# Patient Record
Sex: Female | Born: 1945
Health system: Southern US, Community
[De-identification: ages and names within clinical notes are randomized; demographics above are authoritative.]

## PROBLEM LIST (undated history)

## (undated) DIAGNOSIS — D126 Benign neoplasm of colon, unspecified: Secondary | ICD-10-CM

## (undated) DIAGNOSIS — M1990 Unspecified osteoarthritis, unspecified site: Secondary | ICD-10-CM

## (undated) DIAGNOSIS — I10 Essential (primary) hypertension: Secondary | ICD-10-CM

## (undated) DIAGNOSIS — I700 Atherosclerosis of aorta: Secondary | ICD-10-CM

## (undated) DIAGNOSIS — C189 Malignant neoplasm of colon, unspecified: Secondary | ICD-10-CM

## (undated) DIAGNOSIS — C801 Malignant (primary) neoplasm, unspecified: Secondary | ICD-10-CM

## (undated) DIAGNOSIS — J45909 Unspecified asthma, uncomplicated: Secondary | ICD-10-CM

## (undated) DIAGNOSIS — K439 Ventral hernia without obstruction or gangrene: Secondary | ICD-10-CM

## (undated) DIAGNOSIS — T7840XA Allergy, unspecified, initial encounter: Secondary | ICD-10-CM

## (undated) DIAGNOSIS — E785 Hyperlipidemia, unspecified: Secondary | ICD-10-CM

## (undated) HISTORY — DX: Hyperlipidemia, unspecified: E78.5

## (undated) HISTORY — PX: JOINT REPLACEMENT: SHX530

## (undated) HISTORY — DX: Ventral hernia without obstruction or gangrene: K43.9

## (undated) HISTORY — PX: LAPAROSCOPIC PARTIAL COLECTOMY: SHX5907

## (undated) HISTORY — PX: HERNIA REPAIR: SHX51

## (undated) HISTORY — PX: KNEE SURGERY: SHX244

## (undated) HISTORY — DX: Unspecified asthma, uncomplicated: J45.909

## (undated) HISTORY — PX: SKIN SURGERY: SHX2413

## (undated) HISTORY — DX: Allergy, unspecified, initial encounter: T78.40XA

## (undated) HISTORY — PX: CARPAL TUNNEL RELEASE: SHX101

## (undated) HISTORY — DX: Essential (primary) hypertension: I10

## (undated) HISTORY — PX: GALLBLADDER SURGERY: SHX652

## (undated) HISTORY — PX: TRIGGER FINGER RELEASE: SHX641

---

## 1982-02-03 HISTORY — PX: VAGINAL HYSTERECTOMY: SUR661

## 1999-02-04 DIAGNOSIS — C801 Malignant (primary) neoplasm, unspecified: Secondary | ICD-10-CM

## 1999-02-04 HISTORY — DX: Malignant (primary) neoplasm, unspecified: C80.1

## 2005-05-14 ENCOUNTER — Ambulatory Visit: Payer: Self-pay | Admitting: Family Medicine

## 2005-09-15 ENCOUNTER — Ambulatory Visit: Payer: Self-pay | Admitting: Family Medicine

## 2005-09-18 ENCOUNTER — Ambulatory Visit: Payer: Self-pay | Admitting: Family Medicine

## 2006-04-27 ENCOUNTER — Ambulatory Visit: Payer: Self-pay | Admitting: General Practice

## 2006-05-20 ENCOUNTER — Ambulatory Visit: Payer: Self-pay | Admitting: General Practice

## 2006-05-20 ENCOUNTER — Other Ambulatory Visit: Payer: Self-pay

## 2006-06-03 ENCOUNTER — Ambulatory Visit: Payer: Self-pay | Admitting: General Practice

## 2006-11-11 ENCOUNTER — Ambulatory Visit: Payer: Self-pay | Admitting: Obstetrics and Gynecology

## 2007-01-05 ENCOUNTER — Ambulatory Visit: Payer: Self-pay | Admitting: Family Medicine

## 2007-02-04 DIAGNOSIS — C189 Malignant neoplasm of colon, unspecified: Secondary | ICD-10-CM

## 2007-02-04 HISTORY — PX: COLON SURGERY: SHX602

## 2007-02-04 HISTORY — DX: Malignant neoplasm of colon, unspecified: C18.9

## 2007-09-20 ENCOUNTER — Ambulatory Visit: Payer: Self-pay | Admitting: General Practice

## 2007-09-28 ENCOUNTER — Ambulatory Visit: Payer: Self-pay | Admitting: Gastroenterology

## 2007-10-08 ENCOUNTER — Ambulatory Visit: Payer: Self-pay | Admitting: Surgery

## 2007-10-08 ENCOUNTER — Other Ambulatory Visit: Payer: Self-pay

## 2007-10-13 ENCOUNTER — Inpatient Hospital Stay: Payer: Self-pay | Admitting: Surgery

## 2007-10-13 HISTORY — PX: COLECTOMY: SHX59

## 2007-12-02 ENCOUNTER — Ambulatory Visit: Payer: Self-pay | Admitting: Obstetrics and Gynecology

## 2008-12-26 ENCOUNTER — Ambulatory Visit: Payer: Self-pay | Admitting: Family Medicine

## 2009-04-03 ENCOUNTER — Ambulatory Visit: Payer: Self-pay | Admitting: General Practice

## 2009-04-23 ENCOUNTER — Inpatient Hospital Stay: Payer: Self-pay | Admitting: General Practice

## 2009-11-08 ENCOUNTER — Ambulatory Visit: Payer: Self-pay | Admitting: Family Medicine

## 2009-12-28 ENCOUNTER — Ambulatory Visit: Payer: Self-pay | Admitting: Family Medicine

## 2010-01-08 ENCOUNTER — Ambulatory Visit: Payer: Self-pay | Admitting: General Practice

## 2010-01-21 ENCOUNTER — Inpatient Hospital Stay: Payer: Self-pay | Admitting: General Practice

## 2010-09-24 ENCOUNTER — Ambulatory Visit: Payer: Self-pay | Admitting: Family Medicine

## 2010-09-26 ENCOUNTER — Ambulatory Visit: Payer: Self-pay | Admitting: Family Medicine

## 2010-11-05 ENCOUNTER — Ambulatory Visit: Payer: Self-pay | Admitting: Surgery

## 2010-11-05 HISTORY — PX: CHOLECYSTECTOMY: SHX55

## 2011-01-02 ENCOUNTER — Ambulatory Visit: Payer: Self-pay | Admitting: Family Medicine

## 2011-02-18 ENCOUNTER — Ambulatory Visit: Payer: Self-pay | Admitting: Unknown Physician Specialty

## 2011-08-12 ENCOUNTER — Ambulatory Visit: Payer: Self-pay | Admitting: Surgery

## 2011-08-12 LAB — CBC WITH DIFFERENTIAL/PLATELET
Basophil #: 0.1 10*3/uL (ref 0.0–0.1)
Eosinophil #: 0.3 10*3/uL (ref 0.0–0.7)
Eosinophil %: 3.3 %
MCH: 28.7 pg (ref 26.0–34.0)
MCHC: 33.6 g/dL (ref 32.0–36.0)
Monocyte #: 0.8 x10 3/mm (ref 0.2–0.9)
Monocyte %: 9.3 %
Neutrophil #: 5.5 10*3/uL (ref 1.4–6.5)
Neutrophil %: 63 %
Platelet: 222 10*3/uL (ref 150–440)
RBC: 4.56 10*6/uL (ref 3.80–5.20)
RDW: 14.6 % — ABNORMAL HIGH (ref 11.5–14.5)
WBC: 8.8 10*3/uL (ref 3.6–11.0)

## 2011-08-12 LAB — BASIC METABOLIC PANEL
BUN: 19 mg/dL — ABNORMAL HIGH (ref 7–18)
Chloride: 105 mmol/L (ref 98–107)
Co2: 31 mmol/L (ref 21–32)
EGFR (Non-African Amer.): >60
Glucose: 81 mg/dL (ref 65–99)
Osmolality: 284 (ref 275–301)
Potassium: 3.2 mmol/L — ABNORMAL LOW (ref 3.5–5.1)

## 2011-09-03 ENCOUNTER — Ambulatory Visit: Payer: Self-pay | Admitting: Surgery

## 2011-09-03 LAB — BASIC METABOLIC PANEL
Anion Gap: 7 (ref 7–16)
BUN: 14 mg/dL (ref 7–18)
Chloride: 104 mmol/L (ref 98–107)
Co2: 30 mmol/L (ref 21–32)
Creatinine: 0.88 mg/dL (ref 0.60–1.30)
EGFR (African American): >60
Glucose: 102 mg/dL — ABNORMAL HIGH (ref 65–99)
Sodium: 141 mmol/L (ref 136–145)

## 2011-09-08 ENCOUNTER — Ambulatory Visit: Payer: Self-pay | Admitting: Surgery

## 2012-01-07 ENCOUNTER — Ambulatory Visit: Payer: Self-pay | Admitting: Family Medicine

## 2012-06-29 ENCOUNTER — Ambulatory Visit: Payer: Self-pay | Admitting: Family Medicine

## 2013-07-04 ENCOUNTER — Ambulatory Visit: Payer: Self-pay | Admitting: Family Medicine

## 2013-07-05 ENCOUNTER — Ambulatory Visit: Payer: Self-pay | Admitting: Family Medicine

## 2013-08-03 ENCOUNTER — Ambulatory Visit: Payer: Self-pay | Admitting: Urology

## 2013-09-06 DIAGNOSIS — N2889 Other specified disorders of kidney and ureter: Secondary | ICD-10-CM | POA: Insufficient documentation

## 2013-09-06 DIAGNOSIS — N3941 Urge incontinence: Secondary | ICD-10-CM | POA: Insufficient documentation

## 2013-09-06 DIAGNOSIS — R310 Gross hematuria: Secondary | ICD-10-CM | POA: Insufficient documentation

## 2013-09-21 DIAGNOSIS — N8111 Cystocele, midline: Secondary | ICD-10-CM | POA: Insufficient documentation

## 2014-03-06 HISTORY — PX: COLONOSCOPY: SHX174

## 2014-03-20 ENCOUNTER — Ambulatory Visit: Payer: Self-pay | Admitting: Unknown Physician Specialty

## 2014-05-23 NOTE — Discharge Summary (Signed)
PATIENT NAME:  Kiara Becker, Kiara Becker MR#:  [id number] DATE OF BIRTH:  1945/10/01  DATE OF ADMISSION:  09/08/2011 DATE OF DISCHARGE:  09/09/2011  ATTENDING PHYSICIAN: Dr. Dia Crawford DICTATING PHYSICIAN: Dr. Marlyce Huge   DISCHARGE DIAGNOSIS: Ventral hernia.   PAST MEDICAL HISTORY:  1. Laparoscopic sigmoid colectomy in 2012.  2. History of hemorrhoids. 3. Hypertension. 4. Arrhythmias. 5. Arthritis. 6. Asthma.  7. Status post hysterectomy. 8. History of melanoma.  PROCEDURE PERFORMED: Open primary ventral hernia repair without mesh on 09/08/2011.  DISCHARGE MEDICATIONS: 1. Ultram 50 mg p.o. q.6 hours p.r.n. pain. 2. Fluticasone 50 mcg inhalation powder 1 spray each nostril p.r.n.  3. Ventolin HFA 2 puffs p.o. q.4-6 hours. 4. Torsemide 10 mg p.o. daily. 5. Crestor 5 mg p.o. daily. 6. Fish oil 1200 mg p.o. b.i.d. 7. Niacin 100 mg p.o. at bedtime. 8. Ocuvite PreserVision 1 tablet orally q.a.m.  9. Loratadine 10 mg p.o. daily. 10. Triamterene/hydrochlorothiazide 37.5/25, 1 tablet p.o. q.a.m.  11. Potassium 1 tablet p.o. daily.   NOTE: I have been told she is to hold her aspirin until further instructions after follow up.  HOSPITAL COURSE: [name] is a pleasant 69 year old female with history of laparoscopic sigmoid colectomy. She had an incisional hernia which required open repair. During the surgery she was found to have mobile, strong fascia so the fascia  was closed primarily to decrease the need for mesh and its inherent complications. Postoperatively she was returned to the floor and began on p.o. pain medications and was given IV fluids. She initially was nauseated but was slowly advanced to clear liquids and then regular diet. Her pain medicine was then advanced to p.o. pain medications. Her drains were still draining serosangous fluid and she was taught to empty those and record the output at home. At that time she desired to go home and met the criteria for discharge. She  was discharged home in satisfactory condition.   DISCHARGE INSTRUCTIONS: She is to follow up with Dr. Pat Patrick or a member from his team in 3 to 5 days. She is to record her drain outputs in the meantime. She is not to lift greater than 10 pounds. She is to wear a binder for comfort. She is told to take Tramadol for pain. She is told to hold on her aspirin until instructed. She is to call or return to ER if she develops fever greater than 101.5, nausea, vomiting, increased swelling, increased pain, drainage from her incision, redness from her incision, change in drain quantity or characteristic.   ____________________________ Glena Norfolk. Minie Roadcap, MD cal:cms D: 09/09/2011 14:47:33 ET T: 09/10/2011 11:34:36 ET JOB#: [id number]  cc: Harrell Gave A. Henli Hey, MD, <Dictator> Floyde Parkins MD ELECTRONICALLY SIGNED 09/10/2011 20:09

## 2014-05-23 NOTE — Op Note (Signed)
PATIENT NAME:  Kiara Becker, Kiara Becker MR#:  [id number] DATE OF BIRTH:  16-Feb-1945  DATE OF PROCEDURE:  09/08/2011  PREOPERATIVE DIAGNOSIS: Ventral hernia.   POSTOPERATIVE DIAGNOSIS: Ventral hernia.   OPERATION: Ventral hernia repair.   ANESTHESIA: General.   SURGEON: Rodena Goldmann, III, MD   ASSISTANT: Dr. Rexene Edison and Adelene Idler, PA student   ANESTHESIA: General.   OPERATIVE PROCEDURE: With the patient in the supine position after induction of appropriate general anesthesia, the patient's abdomen was prepped with ChloraPrep and draped with sterile towels. The patient's previous incision was opened and carried down through the subcutaneous tissue with Bovie electrocautery. The hernia sac was identified and appeared to be primarily at the umbilicus and below. It was dissected back to its edges in the fascia without difficulty. The defect was one large defect and multiple small defects which were all encompassed into a single midline defect. The sac was dissected back and omentum and adhesions removed from the abdominal wall. The abdomen was cleaned in a circumferential fashion to eliminate any obstruction or tension. The dissection was carried out to the lateral edge of the rectus muscle which was divided on the anterior fascia over the length of the skin incision to promote mobilization. There was plenty of laxity and I thought that the tissue could be closed primarily rather than placing a piece of mesh. For that reason individual figure-of-eight sutures of 0 Prolene were used to close the incision. Once the defect was completely obliterated, the area was irrigated. 10 mm Jackson-Pratt drain was inserted through separate stab wounds. Skin was clipped and the drain secured with 3-0 silk. A compressive dressing was applied.      The patient was returned to the recovery room having tolerated the procedure well. Sponge, instrument, and needle count were correct x2 in the operating room.    ____________________________ Rodena Goldmann III, MD rle:drc D: 09/08/2011 11:24:49 ET T: 09/08/2011 11:58:15 ET JOB#: [id number]  cc: Micheline Maze, MD, <Dictator> Juline Patch, MD Rodena Goldmann MD ELECTRONICALLY SIGNED 09/11/2011 22:33

## 2014-06-16 DIAGNOSIS — I10 Essential (primary) hypertension: Secondary | ICD-10-CM | POA: Insufficient documentation

## 2014-06-16 DIAGNOSIS — E782 Mixed hyperlipidemia: Secondary | ICD-10-CM | POA: Insufficient documentation

## 2014-06-22 ENCOUNTER — Other Ambulatory Visit: Payer: Self-pay | Admitting: Family Medicine

## 2014-06-22 DIAGNOSIS — Z1231 Encounter for screening mammogram for malignant neoplasm of breast: Secondary | ICD-10-CM

## 2014-07-07 ENCOUNTER — Ambulatory Visit: Payer: Self-pay

## 2014-07-11 ENCOUNTER — Ambulatory Visit: Payer: Self-pay

## 2014-07-14 ENCOUNTER — Ambulatory Visit
Admission: RE | Admit: 2014-07-14 | Discharge: 2014-07-14 | Disposition: A | Discharge status: Home / Self Care | Payer: Medicare Other | Source: Ambulatory Visit | Attending: Family Medicine | Admitting: Family Medicine

## 2014-07-14 ENCOUNTER — Other Ambulatory Visit: Payer: Self-pay

## 2014-07-14 ENCOUNTER — Other Ambulatory Visit: Payer: Self-pay | Admitting: Family Medicine

## 2014-07-14 DIAGNOSIS — R002 Palpitations: Secondary | ICD-10-CM | POA: Insufficient documentation

## 2014-07-14 DIAGNOSIS — Z1231 Encounter for screening mammogram for malignant neoplasm of breast: Secondary | ICD-10-CM | POA: Diagnosis not present

## 2014-07-14 DIAGNOSIS — I493 Ventricular premature depolarization: Secondary | ICD-10-CM | POA: Insufficient documentation

## 2014-07-14 HISTORY — DX: Malignant (primary) neoplasm, unspecified: C80.1

## 2014-07-14 HISTORY — DX: Malignant neoplasm of colon, unspecified: C18.9

## 2014-07-24 ENCOUNTER — Ambulatory Visit (INDEPENDENT_AMBULATORY_CARE_PROVIDER_SITE_OTHER): Payer: Medicare Other | Admitting: Family Medicine

## 2014-07-24 ENCOUNTER — Encounter: Payer: Self-pay | Admitting: Family Medicine

## 2014-07-24 VITALS — BP 130/88 | HR 66 | Ht 64.0 in | Wt 191.0 lb

## 2014-07-24 DIAGNOSIS — E669 Obesity, unspecified: Secondary | ICD-10-CM | POA: Insufficient documentation

## 2014-07-24 DIAGNOSIS — J4520 Mild intermittent asthma, uncomplicated: Secondary | ICD-10-CM | POA: Diagnosis not present

## 2014-07-24 DIAGNOSIS — I8393 Asymptomatic varicose veins of bilateral lower extremities: Secondary | ICD-10-CM | POA: Diagnosis not present

## 2014-07-24 DIAGNOSIS — Z0000 Encounter for general adult medical examination without abnormal findings: Secondary | ICD-10-CM

## 2014-07-24 DIAGNOSIS — I8390 Asymptomatic varicose veins of unspecified lower extremity: Secondary | ICD-10-CM

## 2014-07-24 MED ORDER — ALBUTEROL SULFATE HFA 108 (90 BASE) MCG/ACT IN AERS: 1.0000 | INHALATION_SPRAY | Freq: Every day | RESPIRATORY_TRACT | Status: DC

## 2014-07-24 NOTE — Progress Notes (Signed)
Name: Kiara Becker   MRN: [id number]    DOB: 05/02/1945   Date:07/24/2014       Progress Note  Subjective  Chief Complaint  Chief Complaint  Patient presents with  . Annual Exam    had mammo this year    Gynecologic Exam The patient's pertinent negatives include no pelvic pain, vaginal bleeding or vaginal discharge. The problem occurs constantly. The pain is mild. The problem affects both sides. Associated symptoms include back pain. Pertinent negatives include no abdominal pain, chills, constipation, diarrhea, dysuria, fever, frequency, headaches, hematuria, joint pain, nausea, rash, sore throat or urgency. Her past medical history is significant for an abdominal surgery and a gynecological surgery. There is no history of a Cesarean section, endometriosis, menorrhagia, metrorrhagia, perineal abscess, PID, an STD or a terminated pregnancy.    No problem-specific assessment & plan notes found for this encounter.   Past Medical History  Diagnosis Date  . Cancer 2001    melanoma  . Colon cancer 2009  . Allergy   . Asthma   . Hyperlipidemia   . Hypertension     Past Surgical History  Procedure Laterality Date  . Vaginal hysterectomy  1984  . Colon surgery  2009    remove a mass  . Knee surgery Bilateral     menisacal repair and then replacement  . Hernia repair    . Skin surgery      removal melanoma on arm and eye    Family History  Problem Relation Age of Onset  . Cancer Mother   . Heart disease Mother   . Heart disease Father   . Heart disease Brother   . Drug abuse Maternal Grandfather     History   Social History  . Marital Status: Married    Spouse Name: N/A  . Number of Children: N/A  . Years of Education: N/A   Occupational History  . Not on file.   Social History Main Topics  . Smoking status: Never Smoker   . Smokeless tobacco: Not on file  . Alcohol Use: No  . Drug Use: No  . Sexual Activity: Yes    Birth Control/ Protection: None   Other  Topics Concern  . Not on file   Social History Narrative    Allergies  Allergen Reactions  . Choline Fenofibrate Other (See Comments)    Roaring in ears Roaring in ears  . Codeine Sulfate Other (See Comments)  . Ezetimibe Other (See Comments)    Roaring in ears Roaring in ears  . Fenofibrate Other (See Comments)    Roaring in ears  . Gemfibrozil Other (See Comments)     Review of Systems  Constitutional: Negative for fever, chills, weight loss and malaise/fatigue.  HENT: Negative for ear discharge, ear pain and sore throat.   Eyes: Negative for blurred vision.  Respiratory: Negative for cough, sputum production, shortness of breath and wheezing.   Cardiovascular: Negative for chest pain, palpitations and leg swelling.       Varicose veins  Gastrointestinal: Negative for heartburn, nausea, abdominal pain, diarrhea, constipation, blood in stool and melena.  Genitourinary: Negative for dysuria, urgency, frequency, hematuria, vaginal discharge, pelvic pain and menorrhagia.  Musculoskeletal: Positive for back pain. Negative for myalgias, joint pain and neck pain.  Skin: Negative for rash.  Neurological: Negative for dizziness, tingling, sensory change, focal weakness and headaches.  Endo/Heme/Allergies: Negative for environmental allergies and polydipsia. Does not bruise/bleed easily.  Psychiatric/Behavioral: Negative for depression and suicidal ideas.  The patient is not nervous/anxious and does not have insomnia.      Objective  Filed Vitals:   07/24/14 0922  BP: 130/88  Pulse: 66  Height: 5\' 4"  (1.626 m)  Weight: 191 lb (86.637 kg)    Physical Exam  Constitutional: She is oriented to person, place, and time and well-developed, well-nourished, and in no distress. No distress.  HENT:  Head: Normocephalic and atraumatic.  Right Ear: External ear normal.  Left Ear: External ear normal.  Nose: Nose normal.  Mouth/Throat: Oropharynx is clear and moist.  Eyes:  Conjunctivae and EOM are normal. Pupils are equal, round, and reactive to light. Right eye exhibits no discharge. Left eye exhibits no discharge. No scleral icterus.  Neck: Normal range of motion. Neck supple. No JVD present. No thyromegaly present.  Cardiovascular: Normal rate, regular rhythm, normal heart sounds and intact distal pulses.  Exam reveals no gallop and no friction rub.   No murmur heard. Pulmonary/Chest: Effort normal and breath sounds normal. No respiratory distress. She has no wheezes. She exhibits no tenderness.  Abdominal: Soft. Bowel sounds are normal. She exhibits no mass. There is no tenderness. There is no rebound and no guarding.  Musculoskeletal: Normal range of motion. She exhibits no edema.  Lymphadenopathy:    She has no cervical adenopathy.  Neurological: She is alert and oriented to person, place, and time. She has normal reflexes. No cranial nerve deficit. Gait normal.  Skin: Skin is warm and dry. No rash noted. She is not diaphoretic. No erythema.  Psychiatric: Mood and affect normal.  Nursing note and vitals reviewed.     Assessment & Plan  Problem List Items Addressed This Visit    None        Dr. Otilio Miu [facility]  07/24/2014

## 2014-07-24 NOTE — Patient Instructions (Signed)

## 2014-08-08 ENCOUNTER — Encounter: Payer: Self-pay | Admitting: Surgery

## 2014-08-14 ENCOUNTER — Ambulatory Visit (INDEPENDENT_AMBULATORY_CARE_PROVIDER_SITE_OTHER): Payer: Medicare Other | Admitting: Family Medicine

## 2014-08-14 ENCOUNTER — Encounter: Payer: Self-pay | Admitting: Family Medicine

## 2014-08-14 VITALS — BP 110/80 | HR 64 | Ht 64.0 in | Wt 190.0 lb

## 2014-08-14 DIAGNOSIS — J4520 Mild intermittent asthma, uncomplicated: Secondary | ICD-10-CM | POA: Diagnosis not present

## 2014-08-14 DIAGNOSIS — J219 Acute bronchiolitis, unspecified: Secondary | ICD-10-CM

## 2014-08-14 MED ORDER — AZITHROMYCIN 250 MG PO TABS: ORAL_TABLET | ORAL | Status: DC

## 2014-08-14 MED ORDER — ALBUTEROL SULFATE (2.5 MG/3ML) 0.083% IN NEBU: 2.5000 mg | INHALATION_SOLUTION | Freq: Once | RESPIRATORY_TRACT | Status: AC

## 2014-08-14 NOTE — Progress Notes (Signed)
Name: Kiara Becker   MRN: [id number]    DOB: [birth year]   Date:08/14/2014       Progress Note  Subjective  Chief Complaint  Chief Complaint  Patient presents with  . Bronchitis    cough- clear/thick production    Cough This is a new problem. The current episode started in the past 7 days. The problem has been unchanged. The problem occurs hourly. The cough is non-productive. Associated symptoms include chills, a fever and wheezing. Pertinent negatives include no chest pain, ear congestion, ear pain, headaches, heartburn, hemoptysis, myalgias, nasal congestion, postnasal drip, rash, rhinorrhea, sore throat, shortness of breath, sweats or weight loss. Nothing aggravates the symptoms. She has tried a beta-agonist inhaler for the symptoms. The treatment provided mild relief. Her past medical history is significant for asthma. There is no history of bronchiectasis, bronchitis, COPD, emphysema, environmental allergies or pneumonia.    No problem-specific assessment & plan notes found for this encounter.   Past Medical History  Diagnosis Date  . Cancer 2001    melanoma  . Colon cancer 2009  . Allergy   . Asthma   . Hyperlipidemia   . Hypertension     Past Surgical History  Procedure Laterality Date  . Vaginal hysterectomy  1984  . Colon surgery  2009    remove a mass  . Knee surgery Bilateral     menisacal repair and then replacement  . Hernia repair    . Skin surgery      removal melanoma on arm and eye  . Gallbladder surgery    . Colonoscopy  03/2014    normal- cleared for 5 years- Dr Vira Agar    Family History  Problem Relation Age of Onset  . Cancer Mother   . Heart disease Mother   . Heart disease Father   . Heart disease Brother   . Drug abuse Maternal Grandfather     History   Social History  . Marital Status: Married    Spouse Name: N/A  . Number of Children: N/A  . Years of Education: N/A   Occupational History  . Not on file.   Social History Main  Topics  . Smoking status: Never Smoker   . Smokeless tobacco: Not on file  . Alcohol Use: No  . Drug Use: No  . Sexual Activity: Yes    Birth Control/ Protection: None   Other Topics Concern  . Not on file   Social History Narrative    Allergies  Allergen Reactions  . Choline Fenofibrate Other (See Comments)    Roaring in ears Roaring in ears  . Codeine Sulfate Other (See Comments)  . Ezetimibe Other (See Comments)    Roaring in ears Roaring in ears  . Fenofibrate Other (See Comments)    Roaring in ears  . Gemfibrozil Other (See Comments)     Review of Systems  Constitutional: Positive for fever and chills. Negative for weight loss and malaise/fatigue.  HENT: Negative for ear discharge, ear pain, postnasal drip, rhinorrhea and sore throat.   Eyes: Negative for blurred vision.  Respiratory: Positive for cough and wheezing. Negative for hemoptysis, sputum production and shortness of breath.   Cardiovascular: Negative for chest pain, palpitations and leg swelling.  Gastrointestinal: Negative for heartburn, nausea, abdominal pain, diarrhea, constipation, blood in stool and melena.  Genitourinary: Negative for dysuria, urgency, frequency and hematuria.  Musculoskeletal: Negative for myalgias, back pain, joint pain and neck pain.  Skin: Negative for rash.  Neurological: Negative  for dizziness, tingling, sensory change, focal weakness and headaches.  Endo/Heme/Allergies: Negative for environmental allergies and polydipsia. Does not bruise/bleed easily.  Psychiatric/Behavioral: Negative for depression and suicidal ideas. The patient is not nervous/anxious and does not have insomnia.      Objective  Filed Vitals:   08/14/14 1432  BP: 110/80  Pulse: 64  Height: 5\' 4"  (1.626 m)  Weight: 190 lb (86.183 kg)    Physical Exam  Constitutional: She is well-developed, well-nourished, and in no distress. No distress.  HENT:  Head: Normocephalic and atraumatic.  Right Ear:  External ear normal.  Left Ear: External ear normal.  Nose: Nose normal.  Mouth/Throat: Oropharynx is clear and moist.  Eyes: Conjunctivae and EOM are normal. Pupils are equal, round, and reactive to light. Right eye exhibits no discharge. Left eye exhibits no discharge.  Neck: Normal range of motion. Neck supple. No JVD present. No thyromegaly present.  Cardiovascular: Normal rate, regular rhythm, normal heart sounds and intact distal pulses.  Exam reveals no gallop and no friction rub.   No murmur heard. Pulmonary/Chest: Effort normal and breath sounds normal.  Abdominal: Soft. Bowel sounds are normal. She exhibits no mass. There is no tenderness. There is no guarding.  Musculoskeletal: Normal range of motion. She exhibits no edema.  Lymphadenopathy:    She has no cervical adenopathy.  Neurological: She is alert. She has normal reflexes.  Skin: Skin is warm and dry. She is not diaphoretic.  Psychiatric: Mood and affect normal.      Assessment & Plan  Problem List Items Addressed This Visit      Respiratory   Bronchiolitis - Primary   Relevant Medications   azithromycin (ZITHROMAX) 250 MG tablet    Other Visit Diagnoses    Reactive airway disease, mild intermittent, uncomplicated        Relevant Medications    albuterol (PROVENTIL) (2.5 MG/3ML) 0.083% nebulizer solution 2.5 mg (Start on 08/14/2014  3:15 PM)         Dr. Macon [facility]  08/14/2014

## 2014-08-29 ENCOUNTER — Ambulatory Visit (INDEPENDENT_AMBULATORY_CARE_PROVIDER_SITE_OTHER): Payer: Medicare Other | Admitting: Surgery

## 2014-08-29 ENCOUNTER — Encounter: Payer: Self-pay | Admitting: Surgery

## 2014-08-29 VITALS — BP 155/72 | HR 74 | Temp 98.9°F | Ht 64.0 in | Wt 192.0 lb

## 2014-08-29 DIAGNOSIS — K439 Ventral hernia without obstruction or gangrene: Secondary | ICD-10-CM | POA: Diagnosis not present

## 2014-08-29 NOTE — Progress Notes (Signed)
Surgical Consultation  08/29/2014  Kiara Becker is an 69 y.o. female. With an abdominal wall mass and abdominal pain.  Chief Complaint  Patient presents with  . Hernia    possible sx     HPI: She underwent a ventral hernia repair 3 years ago for multiple abdominal wall defects. He comes of the laxity of her abdominal wall we did not place mesh. Over the last several months she's noticed an increase in her abdominal wall tenderness and a mass on the left side. She's been evaluated by her primary care physician but has not undergone any recent CT scan imaging.  She has no bowel or bladder habit changes associated with this mass. She does have some moderate exercise-induced discomfort. She's had a recent colonoscopy which was unremarkable. She is also being followed by the urologist for a right kidney lesion.  Past Medical History  Diagnosis Date  . Cancer 2001    melanoma  . Colon cancer 2009  . Allergy   . Asthma   . Hyperlipidemia   . Hypertension     Past Surgical History  Procedure Laterality Date  . Vaginal hysterectomy  1984  . Colon surgery  2009    remove a mass  . Knee surgery Bilateral     menisacal repair and then replacement  . Hernia repair    . Skin surgery      removal melanoma on arm and eye  . Gallbladder surgery    . Colonoscopy  03/2014    normal- cleared for 5 years- Dr Vira Agar  . Cholecystectomy  11/05/2010    Dr. Pat Patrick  . Colectomy  10/13/2007    Laparoscopic assisting ascending colectomy    Family History  Problem Relation Age of Onset  . Cancer Mother   . Heart disease Mother   . Heart disease Father   . Heart disease Brother   . Drug abuse Maternal Grandfather     Social History:  reports that she has never smoked. She has never used smokeless tobacco. She reports that she does not drink alcohol or use illicit drugs.  Allergies:  Allergies  Allergen Reactions  . Choline Fenofibrate Other (See Comments)    Roaring in ears  . Codeine  Other (See Comments)    Unknown  . Codeine Sulfate Other (See Comments)  . Ezetimibe Other (See Comments)    Roaring in ears  . Fenofibrate Other (See Comments)    Roaring in ears  . Gemfibrozil Other (See Comments)    Medications reviewed.     Review of Systems  Constitutional: Negative.   HENT: Negative.   Eyes: Negative.   Respiratory: Negative.   Cardiovascular: Negative.   Gastrointestinal: Positive for abdominal pain. Negative for nausea and vomiting.  Genitourinary: Negative.   Musculoskeletal: Negative.   Skin: Negative.   Neurological: Negative.   Endo/Heme/Allergies: Negative.   Psychiatric/Behavioral: Negative.        BP 155/72 mmHg  Pulse 74  Temp(Src) 98.9 F (37.2 C) (Oral)  Ht 5\' 4"  (1.626 m)  Wt 192 lb (87.091 kg)  BMI 32.94 kg/m2  Physical Exam  Constitutional: She is oriented to person, place, and time and well-developed, well-nourished, and in no distress. No distress.  HENT:  Head: Normocephalic and atraumatic.  Eyes: Conjunctivae are normal. Pupils are equal, round, and reactive to light.  Neck: Normal range of motion. Neck supple.  Cardiovascular: Regular rhythm and normal heart sounds.   Pulmonary/Chest: Effort normal and breath sounds normal.  Abdominal:  She exhibits mass. She exhibits no distension. There is tenderness.  She has a moderate left lower abdomen impulse with a clear ventral hernia ring and the abdominal wall.  Musculoskeletal: Normal range of motion. She exhibits no edema.  Neurological: She is alert and oriented to person, place, and time.  Skin: Skin is warm and dry.  Psychiatric: Mood and affect normal.      No results found for this or any previous visit (from the past 48 hour(s)). No results found.  Assessment/Plan: 1. Ventral hernia without obstruction or gangrene She appears to have recurrent abdominal wall hernia. There is no evidence for obstruction or incarceration. It does reduce fairly easily. She would  like to consider laparoscopic repair possible. We will arrange for a CT scan for further evaluation of the abdominal wall to give [place] an idea if that option is possible. We outlined the risks and benefits of surgical intervention versus observation. She would very much like to consider a repair if at all possible.   Dia Crawford III dermatitis

## 2014-08-29 NOTE — Patient Instructions (Signed)
You will need to have a CT scan of your Abdomen and Pelvis. Central scheduling will call you with appointment date and time.  Instructions for exam include: Nothing to eat four hours prior to exam, may have clear liquids. Bring medication list with you to your appointment and arrive 15 minutes prior to scheduled time to register.  Our office will call you with results and recommendations for follow-up.

## 2014-09-06 ENCOUNTER — Ambulatory Visit
Admission: RE | Admit: 2014-09-06 | Discharge: 2014-09-06 | Disposition: A | Discharge status: Home / Self Care | Payer: Medicare Other | Source: Ambulatory Visit | Attending: Surgery | Admitting: Surgery

## 2014-09-06 DIAGNOSIS — Z90710 Acquired absence of both cervix and uterus: Secondary | ICD-10-CM | POA: Insufficient documentation

## 2014-09-06 DIAGNOSIS — K439 Ventral hernia without obstruction or gangrene: Secondary | ICD-10-CM

## 2014-09-06 DIAGNOSIS — Z9049 Acquired absence of other specified parts of digestive tract: Secondary | ICD-10-CM | POA: Insufficient documentation

## 2014-09-06 DIAGNOSIS — K760 Fatty (change of) liver, not elsewhere classified: Secondary | ICD-10-CM | POA: Insufficient documentation

## 2014-09-06 DIAGNOSIS — K5730 Diverticulosis of large intestine without perforation or abscess without bleeding: Secondary | ICD-10-CM | POA: Diagnosis not present

## 2014-09-11 ENCOUNTER — Telehealth: Payer: Self-pay

## 2014-09-11 NOTE — Telephone Encounter (Signed)
Results of CT have been reviewed.   Patient called at this time to schedule follow-up appointment. Patient scheduled 8/29 at 2pm in [place]. Readback of all scheduling details completed over the phone.

## 2014-10-02 ENCOUNTER — Ambulatory Visit (INDEPENDENT_AMBULATORY_CARE_PROVIDER_SITE_OTHER): Payer: Medicare Other | Admitting: Surgery

## 2014-10-02 ENCOUNTER — Encounter: Payer: Self-pay | Admitting: *Deleted

## 2014-10-02 VITALS — BP 150/83 | HR 79 | Temp 98.4°F | Wt 184.0 lb

## 2014-10-02 DIAGNOSIS — K439 Ventral hernia without obstruction or gangrene: Secondary | ICD-10-CM

## 2014-10-02 NOTE — Progress Notes (Signed)
  Surgical Consultation  10/02/2014  Kiara Becker is an 69 y.o. female.   Chief Complaint  Patient presents with  . Follow-up    CT scan result resolve     HPI: She returns following her recent CT scan. She has small palpable abdominal wall hernia likely from her previous surgery. CT scan demonstrates very small defect fat involved. Does not appear to be any evidence for bowel in the defect. She still has a small 11 mm kidney lesion on the right side which is unchanged. At lesion is being followed by her urologist. She denies any new symptoms.  Past Medical History  Diagnosis Date  . Cancer 2001    melanoma  . Colon cancer 2009  . Allergy   . Asthma   . Hyperlipidemia   . Hypertension   . Ventral hernia     Past Surgical History  Procedure Laterality Date  . Vaginal hysterectomy  1984  . Colon surgery  2009    remove a mass  . Knee surgery Bilateral     menisacal repair and then replacement  . Hernia repair    . Skin surgery      removal melanoma on arm and eye  . Gallbladder surgery    . Colonoscopy  03/2014    normal- cleared for 5 years- Dr Vira Agar  . Cholecystectomy  11/05/2010    Dr. Pat Patrick  . Colectomy  10/13/2007    Laparoscopic assisting ascending colectomy    Family History  Problem Relation Age of Onset  . Cancer Mother   . Heart disease Mother   . Heart disease Father   . Heart disease Brother   . Drug abuse Maternal Grandfather     Social History:  reports that she has never smoked. She has never used smokeless tobacco. She reports that she does not drink alcohol or use illicit drugs.  Allergies:  Allergies  Allergen Reactions  . Choline Fenofibrate Other (See Comments)    Roaring in ears  . Codeine Other (See Comments)    Unknown  . Codeine Sulfate Other (See Comments)  . Ezetimibe Other (See Comments)    Roaring in ears  . Fenofibrate Other (See Comments)    Roaring in ears  . Gemfibrozil Other (See Comments)    Medications  reviewed.     ROS     BP 150/83 mmHg  Pulse 79  Temp(Src) 98.4 F (36.9 C) (Oral)  Wt 184 lb (83.462 kg)  Physical Exam no physical examination was performed today.    No results found for this or any previous visit (from the past 48 hour(s)). No results found.  Assessment/Plan: 1. Ventral hernia without obstruction or gangrene We discussed situation at length. Greater than 50% of our time was involved in discussion and counseling. We talked about indications for surgical intervention and the possible complications. At the present time she seems to be minimally impacted by the defect. We'll simply follow her along at this point. We did prescribe an abdominal binder to assist with her more strenuous activities.   Dia Crawford III dermatitis

## 2014-10-23 ENCOUNTER — Ambulatory Visit (INDEPENDENT_AMBULATORY_CARE_PROVIDER_SITE_OTHER): Payer: Medicare Other | Admitting: Family Medicine

## 2014-10-23 ENCOUNTER — Encounter: Payer: Self-pay | Admitting: Family Medicine

## 2014-10-23 VITALS — BP 120/80 | HR 98 | Ht 64.0 in | Wt 181.0 lb

## 2014-10-23 DIAGNOSIS — J219 Acute bronchiolitis, unspecified: Secondary | ICD-10-CM

## 2014-10-23 DIAGNOSIS — J4521 Mild intermittent asthma with (acute) exacerbation: Secondary | ICD-10-CM | POA: Diagnosis not present

## 2014-10-23 MED ORDER — AZITHROMYCIN 250 MG PO TABS: ORAL_TABLET | ORAL | Status: DC

## 2014-10-23 NOTE — Progress Notes (Signed)
Name: Kiara Becker   MRN: [id number]    DOB: Jun 23, 1945   Date:10/23/2014       Progress Note  Subjective  Chief Complaint  Chief Complaint  Patient presents with  . Cough    thick white production- asthma meds not helping    Cough This is a recurrent problem. The current episode started in the past 7 days. The problem has been gradually worsening. The problem occurs constantly. The cough is productive of sputum. Associated symptoms include chest pain, ear congestion, postnasal drip, rhinorrhea, a sore throat, shortness of breath and wheezing. Pertinent negatives include no chills, ear pain, fever, headaches, heartburn, myalgias, nasal congestion, rash or weight loss. The symptoms are aggravated by pollens. Risk factors for lung disease include travel. She has tried a beta-agonist inhaler for the symptoms. The treatment provided no relief. Her past medical history is significant for asthma and bronchitis. There is no history of bronchiectasis, COPD, emphysema, environmental allergies or pneumonia.    No problem-specific assessment & plan notes found for this encounter.   Past Medical History  Diagnosis Date  . Cancer 2001    melanoma  . Colon cancer 2009  . Allergy   . Asthma   . Hyperlipidemia   . Hypertension   . Ventral hernia     Past Surgical History  Procedure Laterality Date  . Vaginal hysterectomy  1984  . Colon surgery  2009    remove a mass  . Knee surgery Bilateral     menisacal repair and then replacement  . Hernia repair    . Skin surgery      removal melanoma on arm and eye  . Gallbladder surgery    . Colonoscopy  03/2014    normal- cleared for 5 years- Dr Vira Agar  . Cholecystectomy  11/05/2010    Dr. Pat Patrick  . Colectomy  10/13/2007    Laparoscopic assisting ascending colectomy    Family History  Problem Relation Age of Onset  . Cancer Mother   . Heart disease Mother   . Heart disease Father   . Heart disease Brother   . Drug abuse Maternal Grandfather      Social History   Social History  . Marital Status: Married    Spouse Name: N/A  . Number of Children: N/A  . Years of Education: N/A   Occupational History  . Not on file.   Social History Main Topics  . Smoking status: Never Smoker   . Smokeless tobacco: Never Used  . Alcohol Use: No  . Drug Use: No  . Sexual Activity: Yes    Birth Control/ Protection: None   Other Topics Concern  . Not on file   Social History Narrative    Allergies  Allergen Reactions  . Choline Fenofibrate Other (See Comments)    Roaring in ears  . Codeine Other (See Comments)    Unknown  . Codeine Sulfate Other (See Comments)  . Ezetimibe Other (See Comments)    Roaring in ears  . Fenofibrate Other (See Comments)    Roaring in ears  . Gemfibrozil Other (See Comments)     Review of Systems  Constitutional: Negative for fever, chills, weight loss and malaise/fatigue.  HENT: Positive for postnasal drip, rhinorrhea and sore throat. Negative for ear discharge and ear pain.   Eyes: Negative for blurred vision.  Respiratory: Positive for cough, shortness of breath and wheezing. Negative for sputum production.   Cardiovascular: Positive for chest pain. Negative for palpitations and  leg swelling.  Gastrointestinal: Negative for heartburn, nausea, abdominal pain, diarrhea, constipation, blood in stool and melena.  Genitourinary: Negative for dysuria, urgency, frequency and hematuria.  Musculoskeletal: Negative for myalgias, back pain, joint pain and neck pain.  Skin: Negative for rash.  Neurological: Negative for dizziness, tingling, sensory change, focal weakness and headaches.  Endo/Heme/Allergies: Negative for environmental allergies and polydipsia. Does not bruise/bleed easily.  Psychiatric/Behavioral: Negative for depression and suicidal ideas. The patient is not nervous/anxious and does not have insomnia.      Objective  Filed Vitals:   10/23/14 1457  BP: 120/80  Pulse: 98  Height:  5\' 4"  (6.659 m)  Weight: 181 lb (82.101 kg)  SpO2: 98%    Physical Exam  Constitutional: She is well-developed, well-nourished, and in no distress. No distress.  HENT:  Head: Normocephalic and atraumatic.  Right Ear: External ear normal.  Left Ear: External ear normal.  Nose: Nose normal.  Mouth/Throat: Oropharynx is clear and moist.  Eyes: Conjunctivae and EOM are normal. Pupils are equal, round, and reactive to light. Right eye exhibits no discharge. Left eye exhibits no discharge.  Neck: Normal range of motion. Neck supple. No JVD present. No thyromegaly present.  Cardiovascular: Normal rate, regular rhythm, normal heart sounds and intact distal pulses.  Exam reveals no gallop and no friction rub.   No murmur heard. Pulmonary/Chest: Effort normal and breath sounds normal.  Abdominal: Soft. Bowel sounds are normal. She exhibits no mass. There is no tenderness. There is no guarding.  Musculoskeletal: Normal range of motion. She exhibits no edema.  Lymphadenopathy:    She has no cervical adenopathy.  Neurological: She is alert. She has normal reflexes.  Skin: Skin is warm and dry. She is not diaphoretic.  Psychiatric: Mood and affect normal.      Assessment & Plan  Problem List Items Addressed This Visit      Respiratory   Bronchiolitis - Primary   Relevant Medications   azithromycin (ZITHROMAX) 250 MG tablet    Other Visit Diagnoses    cont ventolin             Dr. Deanna Jones [facility]  10/23/2014

## 2014-12-04 ENCOUNTER — Ambulatory Visit (INDEPENDENT_AMBULATORY_CARE_PROVIDER_SITE_OTHER): Payer: Medicare Other

## 2014-12-04 DIAGNOSIS — Z23 Encounter for immunization: Secondary | ICD-10-CM

## 2015-03-01 ENCOUNTER — Encounter: Payer: Self-pay | Admitting: Family Medicine

## 2015-03-01 ENCOUNTER — Ambulatory Visit (INDEPENDENT_AMBULATORY_CARE_PROVIDER_SITE_OTHER): Payer: 59 | Admitting: Family Medicine

## 2015-03-01 VITALS — BP 138/80 | HR 78 | Ht 64.0 in | Wt 183.0 lb

## 2015-03-01 DIAGNOSIS — K1120 Sialoadenitis, unspecified: Secondary | ICD-10-CM

## 2015-03-01 MED ORDER — AMOXICILLIN-POT CLAVULANATE 875-125 MG PO TABS: 1.0000 | ORAL_TABLET | Freq: Two times a day (BID) | ORAL | Status: DC

## 2015-03-01 NOTE — Progress Notes (Signed)
Name: Kiara Becker   MRN: [id number]    DOB: March 30, 1945   Date:03/01/2015       Progress Note  Subjective  Chief Complaint  Chief Complaint  Patient presents with  . Mass    on L) side of neck towards the fron of neck- difficulty swallowing while eating yesterday- went to brush teeth and looked in the mirror and could see a lump/ swollen area on L) side of neck    HPI  No problem-specific assessment & plan notes found for this encounter.   Past Medical History  Diagnosis Date  . Cancer ([place]) 2001    melanoma  . Colon cancer ([place]) 2009  . Allergy   . Asthma   . Hyperlipidemia   . Hypertension   . Ventral hernia     Past Surgical History  Procedure Laterality Date  . Vaginal hysterectomy  1984  . Colon surgery  2009    remove a mass  . Knee surgery Bilateral     menisacal repair and then replacement  . Hernia repair    . Skin surgery      removal melanoma on arm and eye  . Gallbladder surgery    . Colonoscopy  03/2014    normal- cleared for 5 years- Dr Vira Agar  . Cholecystectomy  11/05/2010    Dr. Pat Patrick  . Colectomy  10/13/2007    Laparoscopic assisting ascending colectomy    Family History  Problem Relation Age of Onset  . Cancer Mother   . Heart disease Mother   . Heart disease Father   . Heart disease Brother   . Drug abuse Maternal Grandfather     Social History   Social History  . Marital Status: Married    Spouse Name: N/A  . Number of Children: N/A  . Years of Education: N/A   Occupational History  . Not on file.   Social History Main Topics  . Smoking status: Never Smoker   . Smokeless tobacco: Never Used  . Alcohol Use: No  . Drug Use: No  . Sexual Activity: Yes    Birth Control/ Protection: None   Other Topics Concern  . Not on file   Social History Narrative    Allergies  Allergen Reactions  . Choline Fenofibrate Other (See Comments)    Roaring in ears  . Codeine Other (See Comments)    Unknown  . Codeine Sulfate Other (See  Comments)  . Ezetimibe Other (See Comments)    Roaring in ears  . Fenofibrate Other (See Comments)    Roaring in ears  . Gemfibrozil Other (See Comments)     Review of Systems  Constitutional: Negative for fever, chills, weight loss and malaise/fatigue.  HENT: Negative for ear discharge, ear pain and sore throat.   Eyes: Negative for blurred vision.  Respiratory: Negative for cough, sputum production, shortness of breath and wheezing.   Cardiovascular: Negative for chest pain, palpitations and leg swelling.  Gastrointestinal: Negative for heartburn, nausea, abdominal pain, diarrhea, constipation, blood in stool and melena.  Genitourinary: Negative for dysuria, urgency, frequency and hematuria.  Musculoskeletal: Negative for myalgias, back pain, joint pain and neck pain.  Skin: Negative for rash.  Neurological: Negative for dizziness, tingling, sensory change, focal weakness and headaches.  Endo/Heme/Allergies: Negative for environmental allergies and polydipsia. Does not bruise/bleed easily.  Psychiatric/Behavioral: Negative for depression and suicidal ideas. The patient is not nervous/anxious and does not have insomnia.      Objective  Filed Vitals:  03/01/15 1056  BP: 138/80  Pulse: 78  Height: 5\' 4"  (1.626 m)  Weight: 183 lb (83.008 kg)    Physical Exam  Constitutional: She is well-developed, well-nourished, and in no distress. No distress.  HENT:  Head: Normocephalic and atraumatic.  Right Ear: External ear normal.  Left Ear: External ear normal.  Nose: Nose normal.  Mouth/Throat: Oropharynx is clear and moist. No oral lesions. No posterior oropharyngeal edema.  Eyes: Conjunctivae and EOM are normal. Pupils are equal, round, and reactive to light. Right eye exhibits no discharge. Left eye exhibits no discharge.  Neck: Trachea normal and normal range of motion. Neck supple. No JVD present. No thyromegaly present.    Enlarged/ tender left parotid  Cardiovascular:  Normal rate, regular rhythm, normal heart sounds and intact distal pulses.  Exam reveals no gallop and no friction rub.   No murmur heard. Pulmonary/Chest: Effort normal and breath sounds normal.  Abdominal: Soft. Bowel sounds are normal. She exhibits no mass. There is no tenderness. There is no guarding.  Musculoskeletal: Normal range of motion. She exhibits no edema.  Lymphadenopathy:    She has no cervical adenopathy.  Neurological: She is alert. She has normal reflexes.  Skin: Skin is warm and dry. She is not diaphoretic.  Psychiatric: Mood and affect normal.  Nursing note and vitals reviewed.     Assessment & Plan  Problem List Items Addressed This Visit    None    Visit Diagnoses    Parotiditis    -  Primary    Relevant Medications    amoxicillin-clavulanate (AUGMENTIN) 875-125 MG tablet         Dr. Otilio Miu [facility]  03/01/2015

## 2015-03-01 NOTE — Patient Instructions (Signed)
Parotitis Parotitis is soreness and inflammation of one or both parotid glands. The parotid glands produce saliva. They are located on each side of the face, below and in front of the earlobes. The saliva produced comes out of tiny openings (ducts) inside the cheeks. In most cases, parotitis goes away over time or with treatment. If your parotitis is caused by certain long-term (chronic) diseases, it may come back again.  CAUSES  Parotitis can be caused by:  Viral infections. Mumps is one viral infection that can cause parotitis.  Bacterial infections.  Blockage of the salivary ducts due to a salivary stone.  Narrowing of the salivary ducts.  Swelling of the salivary ducts.  Dehydration.  Autoimmune conditions, such as sarcoidosis or Sjogren syndrome.  Air from activities such as scuba diving, glass blowing, or playing an instrument (rare).  Human immunodeficiency virus (HIV) or acquired immunodeficiency syndrome (AIDS).  Tuberculosis. SIGNS AND SYMPTOMS   The ears may appear to be pushed up and out from their normal position.  Redness (erythema) of the skin over the parotid glands.  Pain and tenderness over the parotid glands.  Swelling in the parotid gland area.  Yellowish-white fluid (pus) coming from the ducts inside the cheeks.  Dry mouth.  Bad taste in the mouth. DIAGNOSIS  Your health care provider may determine that you have parotitis based on your symptoms and a physical exam. A sample of fluid may also be taken from the parotid gland and tested to find the cause of your infection. X-rays or computed tomography (CT) scans may be taken if your health care provider thinks you might have a salivary stone blocking your salivary duct. TREATMENT  Treatment varies depending upon the cause of your parotitis. If your parotitis is caused by mumps, no treatment is needed. The condition will go away on its own after 7 to 10 days. In other cases, treatment may  include:  Antibiotic medicine if your infection was caused by bacteria.  Pain medicines.  Gland massage.  Eating sour candy to increase your saliva production.  Removal of salivary stones. Your health care provider may flush stones out with fluids or remove them with tweezers.  Surgery to remove the parotid glands. HOME CARE INSTRUCTIONS   If you were prescribed an antibiotic medicine, finish it all even if you start to feel better.  Put warm compresses on the sore area.  Take medicines only as directed by your health care provider.  Drink enough fluids to keep your urine clear or pale yellow. SEEK IMMEDIATE MEDICAL CARE IF:   You have increasing pain or swelling that is not controlled with medicine.  You have a fever. MAKE SURE YOU:  Understand these instructions.  Will watch your condition.  Will get help right away if you are not doing well or get worse.   This information is not intended to replace advice given to you by your health care provider. Make sure you discuss any questions you have with your health care provider.   Document Released: 07/12/2001 Document Revised: 02/10/2014 Document Reviewed: 06/15/2014 Elsevier Interactive Patient Education 2016 [employer]  

## 2015-03-19 DIAGNOSIS — E782 Mixed hyperlipidemia: Secondary | ICD-10-CM | POA: Diagnosis not present

## 2015-03-23 DIAGNOSIS — I10 Essential (primary) hypertension: Secondary | ICD-10-CM | POA: Diagnosis not present

## 2015-03-23 DIAGNOSIS — E782 Mixed hyperlipidemia: Secondary | ICD-10-CM | POA: Diagnosis not present

## 2015-04-17 DIAGNOSIS — Z1283 Encounter for screening for malignant neoplasm of skin: Secondary | ICD-10-CM | POA: Diagnosis not present

## 2015-04-17 DIAGNOSIS — L814 Other melanin hyperpigmentation: Secondary | ICD-10-CM | POA: Diagnosis not present

## 2015-04-17 DIAGNOSIS — L821 Other seborrheic keratosis: Secondary | ICD-10-CM | POA: Diagnosis not present

## 2015-04-17 DIAGNOSIS — Z85828 Personal history of other malignant neoplasm of skin: Secondary | ICD-10-CM | POA: Diagnosis not present

## 2015-04-17 DIAGNOSIS — D2339 Other benign neoplasm of skin of other parts of face: Secondary | ICD-10-CM | POA: Diagnosis not present

## 2015-05-01 ENCOUNTER — Ambulatory Visit (INDEPENDENT_AMBULATORY_CARE_PROVIDER_SITE_OTHER): Payer: 59 | Admitting: Family Medicine

## 2015-05-01 ENCOUNTER — Encounter: Payer: Self-pay | Admitting: Family Medicine

## 2015-05-01 VITALS — BP 120/80 | HR 76 | Ht 64.0 in | Wt 185.0 lb

## 2015-05-01 DIAGNOSIS — R5383 Other fatigue: Secondary | ICD-10-CM | POA: Diagnosis not present

## 2015-05-01 DIAGNOSIS — R5381 Other malaise: Secondary | ICD-10-CM | POA: Diagnosis not present

## 2015-05-01 NOTE — Progress Notes (Signed)
Name: Kiara Becker   MRN: [id number]    DOB: Jun 03, 1945   Date:05/01/2015       Progress Note  Subjective  Chief Complaint  Chief Complaint  Patient presents with  . Fatigue    "been feeling tired"    Thyroid Problem Presents for follow-up (fatigue) visit. Symptoms include depressed mood, dry skin, fatigue, palpitations and weight loss. Patient reports no anxiety, cold intolerance, constipation, diaphoresis, diarrhea, hair loss, heat intolerance, hoarse voice, leg swelling, menstrual problem, nail problem, visual change or weight gain. The symptoms have been stable. Past treatments include nothing. There is no history of atrial fibrillation or heart failure.    No problem-specific assessment & plan notes found for this encounter.   Past Medical History  Diagnosis Date  . Cancer ([place]) 2001    melanoma  . Colon cancer ([place]) 2009  . Allergy   . Asthma   . Hyperlipidemia   . Hypertension   . Ventral hernia     Past Surgical History  Procedure Laterality Date  . Vaginal hysterectomy  1984  . Colon surgery  2009    remove a mass  . Knee surgery Bilateral     menisacal repair and then replacement  . Hernia repair    . Skin surgery      removal melanoma on arm and eye  . Gallbladder surgery    . Colonoscopy  03/2014    normal- cleared for 5 years- Dr Vira Agar  . Cholecystectomy  11/05/2010    Dr. Pat Patrick  . Colectomy  10/13/2007    Laparoscopic assisting ascending colectomy    Family History  Problem Relation Age of Onset  . Cancer Mother   . Heart disease Mother   . Heart disease Father   . Heart disease Brother   . Drug abuse Maternal Grandfather     Social History   Social History  . Marital Status: Married    Spouse Name: N/A  . Number of Children: N/A  . Years of Education: N/A   Occupational History  . Not on file.   Social History Main Topics  . Smoking status: Never Smoker   . Smokeless tobacco: Never Used  . Alcohol Use: No  . Drug Use: No  . Sexual  Activity: Yes    Birth Control/ Protection: None   Other Topics Concern  . Not on file   Social History Narrative    Allergies  Allergen Reactions  . Choline Fenofibrate Other (See Comments)    Roaring in ears  . Codeine Other (See Comments)    Unknown  . Codeine Sulfate Other (See Comments)  . Ezetimibe Other (See Comments)    Roaring in ears  . Fenofibrate Other (See Comments)    Roaring in ears  . Gemfibrozil Other (See Comments)     Review of Systems  Constitutional: Positive for weight loss and fatigue. Negative for fever, chills, weight gain, malaise/fatigue and diaphoresis.  HENT: Negative for ear discharge, ear pain, hoarse voice and sore throat.   Eyes: Negative for blurred vision.  Respiratory: Negative for cough, sputum production, shortness of breath and wheezing.   Cardiovascular: Positive for palpitations. Negative for chest pain and leg swelling.  Gastrointestinal: Negative for heartburn, nausea, abdominal pain, diarrhea, constipation, blood in stool and melena.  Genitourinary: Negative for dysuria, urgency, frequency, hematuria and menstrual problem.  Musculoskeletal: Negative for myalgias, back pain, joint pain and neck pain.  Skin: Negative for rash.  Neurological: Negative for dizziness, tingling, sensory change,  focal weakness and headaches.  Endo/Heme/Allergies: Negative for environmental allergies, cold intolerance, heat intolerance and polydipsia. Does not bruise/bleed easily.  Psychiatric/Behavioral: Negative for depression and suicidal ideas. The patient is not nervous/anxious and does not have insomnia.      Objective  Filed Vitals:   05/01/15 0927  BP: 120/80  Pulse: 76  Height: '5\' 4"'  (1.626 m)  Weight: 185 lb (83.915 kg)    Physical Exam  Constitutional: She is well-developed, well-nourished, and in no distress. No distress.  HENT:  Head: Normocephalic and atraumatic.  Right Ear: External ear normal.  Left Ear: External ear normal.   Nose: Nose normal.  Mouth/Throat: Oropharynx is clear and moist.  Eyes: Conjunctivae and EOM are normal. Pupils are equal, round, and reactive to light. Right eye exhibits no discharge. Left eye exhibits no discharge.  Neck: Normal range of motion. Neck supple. No JVD present. No thyromegaly present.  Cardiovascular: Normal rate, regular rhythm, normal heart sounds and intact distal pulses.  Exam reveals no gallop and no friction rub.   No murmur heard. Pulmonary/Chest: Effort normal and breath sounds normal.  Abdominal: Soft. Bowel sounds are normal. She exhibits no mass. There is no tenderness. There is no guarding.  Musculoskeletal: Normal range of motion. She exhibits no edema.  Lymphadenopathy:    She has no cervical adenopathy.  Neurological: She is alert. She has normal reflexes.  Skin: Skin is warm and dry. She is not diaphoretic.  Psychiatric: Mood and affect normal.  Nursing note and vitals reviewed.     Assessment & Plan  Problem List Items Addressed This Visit    None    Visit Diagnoses    Malaise and fatigue    -  Primary    Relevant Orders    Sed Rate (ESR)    CBC w/Diff/Platelet    TSH    Ferritin    Iron    Renal Function Panel         Dr. Otilio Miu [facility]  05/01/2015

## 2015-05-02 LAB — RENAL FUNCTION PANEL
Albumin: 4.5 g/dL (ref 3.6–4.8)
BUN / CREAT RATIO: 24 (ref 11–26)
BUN: 21 mg/dL (ref 8–27)
CO2: 25 mmol/L (ref 18–29)
CREATININE: 0.87 mg/dL (ref 0.57–1.00)
Calcium: 9.8 mg/dL (ref 8.7–10.3)
Chloride: 100 mmol/L (ref 96–106)
GFR calc non Af Amer: 68 mL/min/{1.73_m2} (ref >59)
GFR, EST AFRICAN AMERICAN: 79 mL/min/{1.73_m2} (ref >59)
Glucose: 85 mg/dL (ref 65–99)
Phosphorus: 3.9 mg/dL (ref 2.5–4.5)
Potassium: 4.4 mmol/L (ref 3.5–5.2)
SODIUM: 144 mmol/L (ref 134–144)

## 2015-05-02 LAB — CBC WITH DIFFERENTIAL/PLATELET
BASOS ABS: 0.1 10*3/uL (ref 0.0–0.2)
Basos: 1 %
EOS (ABSOLUTE): 0.7 10*3/uL — ABNORMAL HIGH (ref 0.0–0.4)
EOS: 6 %
HEMATOCRIT: 41.2 % (ref 34.0–46.6)
HEMOGLOBIN: 13.7 g/dL (ref 11.1–15.9)
IMMATURE GRANULOCYTES: 0 %
Immature Grans (Abs): 0 10*3/uL (ref 0.0–0.1)
LYMPHS ABS: 2.4 10*3/uL (ref 0.7–3.1)
LYMPHS: 23 %
MCH: 28.7 pg (ref 26.6–33.0)
MCHC: 33.3 g/dL (ref 31.5–35.7)
MCV: 86 fL (ref 79–97)
MONOCYTES: 10 %
Monocytes Absolute: 1 10*3/uL — ABNORMAL HIGH (ref 0.1–0.9)
NEUTROS PCT: 60 %
Neutrophils Absolute: 6.5 10*3/uL (ref 1.4–7.0)
Platelets: 305 10*3/uL (ref 150–379)
RBC: 4.78 x10E6/uL (ref 3.77–5.28)
RDW: 14.7 % (ref 12.3–15.4)
WBC: 10.8 10*3/uL (ref 3.4–10.8)

## 2015-05-02 LAB — SEDIMENTATION RATE: Sed Rate: 10 mm/hr (ref 0–40)

## 2015-05-02 LAB — IRON: IRON: 50 ug/dL (ref 27–139)

## 2015-05-02 LAB — TSH: TSH: 3.5 u[IU]/mL (ref 0.450–4.500)

## 2015-05-02 LAB — FERRITIN: FERRITIN: 118 ng/mL (ref 15–150)

## 2015-06-26 DIAGNOSIS — N2889 Other specified disorders of kidney and ureter: Secondary | ICD-10-CM | POA: Diagnosis not present

## 2015-06-26 DIAGNOSIS — N8111 Cystocele, midline: Secondary | ICD-10-CM | POA: Diagnosis not present

## 2015-06-26 DIAGNOSIS — N281 Cyst of kidney, acquired: Secondary | ICD-10-CM | POA: Diagnosis not present

## 2015-06-26 DIAGNOSIS — R310 Gross hematuria: Secondary | ICD-10-CM | POA: Diagnosis not present

## 2015-06-27 ENCOUNTER — Other Ambulatory Visit: Payer: Self-pay | Admitting: Family Medicine

## 2015-06-27 DIAGNOSIS — Z1231 Encounter for screening mammogram for malignant neoplasm of breast: Secondary | ICD-10-CM

## 2015-07-17 ENCOUNTER — Ambulatory Visit
Admission: RE | Admit: 2015-07-17 | Discharge: 2015-07-17 | Disposition: A | Discharge status: Home / Self Care | Payer: Medicare Other | Source: Ambulatory Visit | Attending: Family Medicine | Admitting: Family Medicine

## 2015-07-17 ENCOUNTER — Other Ambulatory Visit: Payer: Self-pay | Admitting: Family Medicine

## 2015-07-17 DIAGNOSIS — Z1231 Encounter for screening mammogram for malignant neoplasm of breast: Secondary | ICD-10-CM | POA: Diagnosis not present

## 2015-07-25 ENCOUNTER — Encounter: Payer: Self-pay | Admitting: Family Medicine

## 2015-07-25 ENCOUNTER — Ambulatory Visit (INDEPENDENT_AMBULATORY_CARE_PROVIDER_SITE_OTHER): Payer: Medicare Other | Admitting: Family Medicine

## 2015-07-25 ENCOUNTER — Other Ambulatory Visit: Payer: Self-pay

## 2015-07-25 VITALS — BP 120/78 | HR 72 | Ht 64.0 in | Wt 179.0 lb

## 2015-07-25 DIAGNOSIS — H8309 Labyrinthitis, unspecified ear: Secondary | ICD-10-CM | POA: Diagnosis not present

## 2015-07-25 DIAGNOSIS — J4520 Mild intermittent asthma, uncomplicated: Secondary | ICD-10-CM

## 2015-07-25 DIAGNOSIS — Z23 Encounter for immunization: Secondary | ICD-10-CM | POA: Diagnosis not present

## 2015-07-25 DIAGNOSIS — Z0000 Encounter for general adult medical examination without abnormal findings: Secondary | ICD-10-CM | POA: Diagnosis not present

## 2015-07-25 MED ORDER — MECLIZINE HCL 25 MG PO TABS: 25.0000 mg | ORAL_TABLET | Freq: Three times a day (TID) | ORAL | Status: DC | PRN

## 2015-07-25 MED ORDER — ALBUTEROL SULFATE HFA 108 (90 BASE) MCG/ACT IN AERS: 2.0000 | INHALATION_SPRAY | Freq: Four times a day (QID) | RESPIRATORY_TRACT | Status: DC | PRN

## 2015-07-25 NOTE — Progress Notes (Signed)
Name: Kiara Becker   MRN: [id number]    DOB: Jun 09, 1945   Date:07/25/2015       Progress Note  Subjective  Chief Complaint  Chief Complaint  Patient presents with  . medicare annual wellness    HPI Comments: MAW: depression normal/ falls normal/ cognitive scale 0   No problem-specific assessment & plan notes found for this encounter.   Past Medical History  Diagnosis Date  . Cancer ([place]) 2001    melanoma  . Colon cancer ([place]) 2009  . Allergy   . Asthma   . Hyperlipidemia   . Hypertension   . Ventral hernia     Past Surgical History  Procedure Laterality Date  . Vaginal hysterectomy  1984  . Colon surgery  2009    remove a mass  . Knee surgery Bilateral     menisacal repair and then replacement  . Hernia repair    . Skin surgery      removal melanoma on arm and eye  . Gallbladder surgery    . Colonoscopy  03/2014    normal- cleared for 5 years- Dr Vira Agar  . Cholecystectomy  11/05/2010    Dr. Pat Patrick  . Colectomy  10/13/2007    Laparoscopic assisting ascending colectomy    Family History  Problem Relation Age of Onset  . Cancer Mother   . Heart disease Mother   . Heart disease Father   . Heart disease Brother   . Drug abuse Maternal Grandfather     Social History   Social History  . Marital Status: Married    Spouse Name: N/A  . Number of Children: N/A  . Years of Education: N/A   Occupational History  . Not on file.   Social History Main Topics  . Smoking status: Never Smoker   . Smokeless tobacco: Never Used  . Alcohol Use: No  . Drug Use: No  . Sexual Activity: Yes    Birth Control/ Protection: None   Other Topics Concern  . Not on file   Social History Narrative    Allergies  Allergen Reactions  . Choline Fenofibrate Other (See Comments)    Roaring in ears  . Codeine Other (See Comments)    Unknown  . Codeine Sulfate Other (See Comments)  . Ezetimibe Other (See Comments)    Roaring in ears  . Fenofibrate Other (See Comments)   Roaring in ears  . Gemfibrozil Other (See Comments)     Review of Systems  Constitutional: Negative for fever, chills, weight loss and malaise/fatigue.  HENT: Negative for ear discharge, ear pain and sore throat.   Eyes: Negative for blurred vision.  Respiratory: Negative for cough, sputum production, shortness of breath and wheezing.   Cardiovascular: Negative for chest pain, palpitations and leg swelling.  Gastrointestinal: Negative for heartburn, nausea, abdominal pain, diarrhea, constipation, blood in stool and melena.  Genitourinary: Negative for dysuria, urgency, frequency and hematuria.  Musculoskeletal: Negative for myalgias, back pain, joint pain and neck pain.  Skin: Negative for rash.  Neurological: Negative for dizziness, tingling, sensory change, focal weakness and headaches.  Endo/Heme/Allergies: Negative for environmental allergies and polydipsia. Does not bruise/bleed easily.  Psychiatric/Behavioral: Negative for depression and suicidal ideas. The patient is not nervous/anxious and does not have insomnia.      Objective  Filed Vitals:   07/25/15 0822  BP: 120/78  Pulse: 72  Height: 5\' 4"  (1.626 m)  Weight: 179 lb (81.194 kg)    Physical Exam  Constitutional: She  is well-developed, well-nourished, and in no distress. No distress.  HENT:  Head: Normocephalic and atraumatic.  Right Ear: External ear normal.  Left Ear: External ear normal.  Nose: Nose normal.  Mouth/Throat: Oropharynx is clear and moist.  Eyes: Conjunctivae and EOM are normal. Pupils are equal, round, and reactive to light. Right eye exhibits no discharge. Left eye exhibits no discharge.  Neck: Normal range of motion. Neck supple. No JVD present. No thyromegaly present.  Cardiovascular: Normal rate, regular rhythm, normal heart sounds and intact distal pulses.  Exam reveals no gallop and no friction rub.   No murmur heard. Pulmonary/Chest: Effort normal and breath sounds normal.  Abdominal:  Soft. Bowel sounds are normal. She exhibits no mass. There is no tenderness. There is no guarding.  Musculoskeletal: Normal range of motion. She exhibits no edema.       Lumbar back: She exhibits swelling.  Palpable lipoma left lower lumbar  Lymphadenopathy:    She has no cervical adenopathy.  Neurological: She is alert.  Skin: Skin is warm and dry. She is not diaphoretic.  Psychiatric: Mood and affect normal.      Assessment & Plan  Problem List Items Addressed This Visit      Other   Medicare annual wellness visit, subsequent - Primary    Other Visit Diagnoses    Labyrinthitis, unspecified laterality        Relevant Medications    meclizine (ANTIVERT) 25 MG tablet    Reactive airway disease, mild intermittent, uncomplicated        Relevant Medications    albuterol (PROVENTIL HFA;VENTOLIN HFA) 108 (90 Base) MCG/ACT inhaler    Need for pneumococcal vaccination        Relevant Orders    Pneumococcal conjugate vaccine 13-valent (Completed)     Pt was assessed for memory and falls/ prevention. Pt is up to date on all immunizations, colonoscopy and mammogram.     Dr. Otilio Miu [facility]  07/25/2015

## 2015-08-31 ENCOUNTER — Encounter: Payer: Self-pay | Admitting: Family Medicine

## 2015-08-31 ENCOUNTER — Ambulatory Visit (INDEPENDENT_AMBULATORY_CARE_PROVIDER_SITE_OTHER): Payer: Medicare Other | Admitting: Family Medicine

## 2015-08-31 VITALS — BP 90/64 | HR 64 | Temp 99.2°F | Ht 64.0 in | Wt 173.0 lb

## 2015-08-31 DIAGNOSIS — E860 Dehydration: Secondary | ICD-10-CM

## 2015-08-31 DIAGNOSIS — K529 Noninfective gastroenteritis and colitis, unspecified: Secondary | ICD-10-CM

## 2015-08-31 LAB — HEMOCCULT GUIAC POC 1CARD (OFFICE): FECAL OCCULT BLD: NEGATIVE

## 2015-08-31 NOTE — Patient Instructions (Signed)

## 2015-08-31 NOTE — Progress Notes (Signed)
Name: Kiara Becker   MRN: [id number]    DOB: [birth year]   Date:08/31/2015       Progress Note  Subjective  Chief Complaint  Chief Complaint  Patient presents with  . Diarrhea    loose stools "every time I eat"- x 3 weeks- Immodium helps "as long as I don't eat"    Diarrhea   This is a recurrent problem. The current episode started 1 to 4 weeks ago. The problem occurs 5 to 10 times per day. The problem has been waxing and waning. The stool consistency is described as watery. The patient states that diarrhea does not awaken her from sleep. Pertinent negatives include no abdominal pain, arthralgias, bloating, chills, coughing, fever, headaches, increased  flatus, myalgias, sweats, URI, vomiting or weight loss. She has tried anti-motility drug for the symptoms. The treatment provided moderate relief. There is no history of bowel resection, inflammatory bowel disease, irritable bowel syndrome, malabsorption, a recent abdominal surgery or short gut syndrome.    No problem-specific Assessment & Plan notes found for this encounter.   Past Medical History:  Diagnosis Date  . Allergy   . Asthma   . Cancer ([place]) 2001   melanoma  . Colon cancer ([place]) 2009  . Hyperlipidemia   . Hypertension   . Ventral hernia     Past Surgical History:  Procedure Laterality Date  . CHOLECYSTECTOMY  11/05/2010   Dr. Pat Patrick  . COLECTOMY  10/13/2007   Laparoscopic assisting ascending colectomy  . COLON SURGERY  2009   remove a mass  . COLONOSCOPY  03/2014   normal- cleared for 5 years- Dr Vira Agar  . GALLBLADDER SURGERY    . HERNIA REPAIR    . KNEE SURGERY Bilateral    menisacal repair and then replacement  . SKIN SURGERY     removal melanoma on arm and eye  . VAGINAL HYSTERECTOMY  1984    Family History  Problem Relation Age of Onset  . Cancer Mother   . Heart disease Mother   . Heart disease Father   . Heart disease Brother   . Drug abuse Maternal Grandfather     Social History   Social  History  . Marital status: Married    Spouse name: N/A  . Number of children: N/A  . Years of education: N/A   Occupational History  . Not on file.   Social History Main Topics  . Smoking status: Never Smoker  . Smokeless tobacco: Never Used  . Alcohol use No  . Drug use: No  . Sexual activity: Yes    Birth control/ protection: None   Other Topics Concern  . Not on file   Social History Narrative  . No narrative on file    Allergies  Allergen Reactions  . Choline Fenofibrate Other (See Comments)    Roaring in ears  . Codeine Other (See Comments)    Unknown  . Codeine Sulfate Other (See Comments)  . Ezetimibe Other (See Comments)    Roaring in ears  . Fenofibrate Other (See Comments)    Roaring in ears  . Fenofibric Acid Other (See Comments)    Roaring in ears  . Gemfibrozil Other (See Comments)     Review of Systems  Constitutional: Negative for chills, fever, malaise/fatigue and weight loss.  HENT: Negative for ear discharge, ear pain and sore throat.   Eyes: Negative for blurred vision.  Respiratory: Negative for cough, sputum production, shortness of breath and wheezing.   Cardiovascular:  Negative for chest pain, palpitations and leg swelling.  Gastrointestinal: Positive for diarrhea. Negative for abdominal pain, bloating, blood in stool, constipation, flatus, heartburn, melena, nausea and vomiting.  Genitourinary: Negative for dysuria, frequency, hematuria and urgency.  Musculoskeletal: Negative for arthralgias, back pain, joint pain, myalgias and neck pain.  Skin: Negative for rash.  Neurological: Negative for dizziness, tingling, sensory change, focal weakness and headaches.  Endo/Heme/Allergies: Negative for environmental allergies and polydipsia. Does not bruise/bleed easily.  Psychiatric/Behavioral: Negative for depression and suicidal ideas. The patient is not nervous/anxious and does not have insomnia.      Objective  Vitals:   08/31/15 1344   BP: 90/64  Pulse: 64  Temp: 99.2 F (37.3 C)  TempSrc: Oral  Weight: 173 lb (78.5 kg)  Height: 5\' 4"  (1.626 m)    Physical Exam  Constitutional: She is well-developed, well-nourished, and in no distress. No distress.  HENT:  Head: Normocephalic and atraumatic.  Right Ear: External ear normal.  Left Ear: External ear normal.  Nose: Nose normal.  Mouth/Throat: Oropharynx is clear and moist. Mucous membranes are not pale and dry.  Eyes: Conjunctivae and EOM are normal. Pupils are equal, round, and reactive to light. Right eye exhibits no discharge. Left eye exhibits no discharge.  Neck: Normal range of motion. Neck supple. No JVD present. No thyromegaly present.  Cardiovascular: Normal rate, regular rhythm, normal heart sounds and intact distal pulses.  Exam reveals no gallop and no friction rub.   No murmur heard. Pulmonary/Chest: Effort normal and breath sounds normal. She has no wheezes. She has no rales.  Abdominal: Soft. Bowel sounds are normal. She exhibits no mass. There is no splenomegaly or hepatomegaly. There is no tenderness. There is no guarding and no CVA tenderness.  Genitourinary: Rectal exam shows external hemorrhoid.  Musculoskeletal: Normal range of motion. She exhibits no edema.  Lymphadenopathy:    She has no cervical adenopathy.  Neurological: She is alert. She has normal reflexes.  Skin: Skin is warm and dry. She is not diaphoretic.  Psychiatric: Mood and affect normal.  Nursing note and vitals reviewed.     Assessment & Plan  Problem List Items Addressed This Visit    None    Visit Diagnoses    Enteritis    -  Primary   Relevant Orders   Stool C-Diff Toxin Assay   Stool Culture   Ova and parasite examination   POCT Occult Blood Stool (Completed)   Dehydration       hold diuretic        Dr. Otilio Miu [facility]  08/31/15

## 2015-09-03 DIAGNOSIS — K529 Noninfective gastroenteritis and colitis, unspecified: Secondary | ICD-10-CM | POA: Diagnosis not present

## 2015-09-05 LAB — OVA AND PARASITE EXAMINATION

## 2015-09-05 LAB — CLOSTRIDIUM DIFFICILE EIA: C DIFFICILE TOXINS A+ B, EIA: NEGATIVE

## 2015-09-08 LAB — STOOL CULTURE: E COLI SHIGA TOXIN ASSAY: NEGATIVE

## 2015-09-10 ENCOUNTER — Other Ambulatory Visit: Payer: Self-pay

## 2015-09-10 DIAGNOSIS — R197 Diarrhea, unspecified: Secondary | ICD-10-CM

## 2015-09-11 ENCOUNTER — Other Ambulatory Visit
Admission: RE | Admit: 2015-09-11 | Discharge: 2015-09-11 | Disposition: A | Discharge status: Home / Self Care | Payer: Medicare Other | Source: Ambulatory Visit | Attending: Nurse Practitioner | Admitting: Nurse Practitioner

## 2015-09-11 DIAGNOSIS — R197 Diarrhea, unspecified: Secondary | ICD-10-CM | POA: Diagnosis not present

## 2015-09-11 LAB — GASTROINTESTINAL PANEL BY PCR, STOOL (REPLACES STOOL CULTURE)

## 2015-09-25 DIAGNOSIS — R197 Diarrhea, unspecified: Secondary | ICD-10-CM | POA: Diagnosis not present

## 2015-09-28 ENCOUNTER — Ambulatory Visit (INDEPENDENT_AMBULATORY_CARE_PROVIDER_SITE_OTHER): Payer: Medicare Other | Admitting: Family Medicine

## 2015-09-28 ENCOUNTER — Encounter: Payer: Self-pay | Admitting: Family Medicine

## 2015-09-28 VITALS — BP 120/78 | HR 68 | Ht 64.0 in | Wt 179.0 lb

## 2015-09-28 DIAGNOSIS — M109 Gout, unspecified: Secondary | ICD-10-CM

## 2015-09-28 DIAGNOSIS — M10072 Idiopathic gout, left ankle and foot: Secondary | ICD-10-CM | POA: Diagnosis not present

## 2015-09-28 MED ORDER — COLCHICINE 0.6 MG PO TABS: 0.6000 mg | ORAL_TABLET | Freq: Every day | ORAL | 0 refills | Status: DC

## 2015-09-28 NOTE — Progress Notes (Signed)
Name: Kiara Becker   MRN: [id number]    DOB: 11/26/1945   Date:09/28/2015       Progress Note  Subjective  Chief Complaint  Chief Complaint  Patient presents with  . Edema    L) foot swelling x 1 week- red at joint    Foot Injury   There was no injury mechanism. The pain is present in the left toes (left mp ). The quality of the pain is described as aching. The pain is mild. The pain has been fluctuating since onset. Pertinent negatives include no tingling. Nothing aggravates the symptoms. She has tried nothing for the symptoms. The treatment provided mild relief.    No problem-specific Assessment & Plan notes found for this encounter.   Past Medical History:  Diagnosis Date  . Allergy   . Asthma   . Cancer ([place]) 2001   melanoma  . Colon cancer ([place]) 2009  . Hyperlipidemia   . Hypertension   . Ventral hernia     Past Surgical History:  Procedure Laterality Date  . CHOLECYSTECTOMY  11/05/2010   Dr. Pat Patrick  . COLECTOMY  10/13/2007   Laparoscopic assisting ascending colectomy  . COLON SURGERY  2009   remove a mass  . COLONOSCOPY  03/2014   normal- cleared for 5 years- Dr Vira Agar  . GALLBLADDER SURGERY    . HERNIA REPAIR    . KNEE SURGERY Bilateral    menisacal repair and then replacement  . SKIN SURGERY     removal melanoma on arm and eye  . VAGINAL HYSTERECTOMY  1984    Family History  Problem Relation Age of Onset  . Cancer Mother   . Heart disease Mother   . Heart disease Father   . Heart disease Brother   . Drug abuse Maternal Grandfather     Social History   Social History  . Marital status: Married    Spouse name: N/A  . Number of children: N/A  . Years of education: N/A   Occupational History  . Not on file.   Social History Main Topics  . Smoking status: Never Smoker  . Smokeless tobacco: Never Used  . Alcohol use No  . Drug use: No  . Sexual activity: Yes    Birth control/ protection: None   Other Topics Concern  . Not on file   Social  History Narrative  . No narrative on file    Allergies  Allergen Reactions  . Choline Fenofibrate Other (See Comments)    Roaring in ears  . Codeine Other (See Comments)    Unknown  . Codeine Sulfate Other (See Comments)  . Ezetimibe Other (See Comments)    Roaring in ears  . Fenofibrate Other (See Comments)    Roaring in ears  . Fenofibric Acid Other (See Comments)    Roaring in ears  . Gemfibrozil Other (See Comments)     Review of Systems  Constitutional: Negative for chills, fever, malaise/fatigue and weight loss.  HENT: Negative for ear discharge, ear pain and sore throat.   Eyes: Negative for blurred vision.  Respiratory: Negative for cough, sputum production, shortness of breath and wheezing.   Cardiovascular: Negative for chest pain, palpitations and leg swelling.  Gastrointestinal: Negative for abdominal pain, blood in stool, constipation, diarrhea, heartburn, melena and nausea.  Genitourinary: Negative for dysuria, frequency, hematuria and urgency.  Musculoskeletal: Negative for back pain, joint pain, myalgias and neck pain.  Skin: Negative for rash.  Neurological: Negative for dizziness, tingling,  sensory change, focal weakness and headaches.  Endo/Heme/Allergies: Negative for environmental allergies and polydipsia. Does not bruise/bleed easily.  Psychiatric/Behavioral: Negative for depression and suicidal ideas. The patient is not nervous/anxious and does not have insomnia.      Objective  Vitals:   09/28/15 1058  BP: 120/78  Pulse: 68  Weight: 179 lb (81.2 kg)  Height: 5\' 4"  (1.626 m)    Physical Exam  Constitutional: She is well-developed, well-nourished, and in no distress. No distress.  HENT:  Head: Normocephalic and atraumatic.  Right Ear: External ear normal.  Left Ear: External ear normal.  Nose: Nose normal.  Mouth/Throat: Oropharynx is clear and moist.  Eyes: Conjunctivae and EOM are normal. Pupils are equal, round, and reactive to light.  Right eye exhibits no discharge. Left eye exhibits no discharge.  Neck: Normal range of motion. Neck supple. No JVD present. No thyromegaly present.  Cardiovascular: Normal rate, regular rhythm, normal heart sounds and intact distal pulses.  Exam reveals no gallop and no friction rub.   No murmur heard. Pulmonary/Chest: Effort normal and breath sounds normal. She has no wheezes. She has no rales.  Abdominal: Soft. Bowel sounds are normal. She exhibits no mass. There is no tenderness. There is no guarding.  Musculoskeletal: Normal range of motion. She exhibits no edema.  Lymphadenopathy:    She has no cervical adenopathy.  Neurological: She is alert. She has normal reflexes.  Skin: Skin is warm and dry. She is not diaphoretic.  Psychiatric: Mood and affect normal.  Nursing note and vitals reviewed.     Assessment & Plan  Problem List Items Addressed This Visit    None    Visit Diagnoses    Acute gout of left foot, unspecified cause    -  Primary   Relevant Medications   colchicine 0.6 MG tablet   Other Relevant Orders   Uric acid        Dr. [facility]  09/28/15

## 2015-09-28 NOTE — Patient Instructions (Signed)
Low-Purine Diet  Purines are compounds that affect the level of uric acid in your body. A low-purine diet is a diet that is low in purines. Eating a low-purine diet can prevent the level of uric acid in your body from getting too high and causing gout or kidney stones or both.  WHAT DO I NEED TO KNOW ABOUT THIS DIET?  · Choose low-purine foods. Examples of low-purine foods are listed in the next section.  · Drink plenty of fluids, especially water. Fluids can help remove uric acid from your body. Try to drink 8-16 cups (1.9-3.8 L) a day.  · Limit foods high in fat, especially saturated fat, as fat makes it harder for the body to get rid of uric acid. Foods high in saturated fat include pizza, cheese, ice cream, whole milk, fried foods, and gravies. Choose foods that are lower in fat and lean sources of protein. Use olive oil when cooking as it contains healthy fats that are not high in saturated fat.  · Limit alcohol. Alcohol interferes with the elimination of uric acid from your body. If you are having a gout attack, avoid all alcohol.  · Keep in mind that different people's bodies react differently to different foods. You will probably learn over time which foods do or do not affect you. If you discover that a food tends to cause your gout to flare up, avoid eating that food. You can more freely enjoy foods that do not cause problems. If you have any questions about a food item, talk to your dietitian or health care provider.  WHICH FOODS ARE LOW, MODERATE, AND HIGH IN PURINES?  The following is a list of foods that are low, moderate, and high in purines. You can eat any amount of the foods that are low in purines. You may be able to have small amounts of foods that are moderate in purines. Ask your health care provider how much of a food moderate in purines you can have. Avoid foods high in purines.  Grains  · Foods low in purines: Enriched white bread, pasta, rice, cake, cornbread, popcorn.  · Foods moderate in  purines: Whole-grain breads and cereals, wheat germ, bran, oatmeal. Uncooked oatmeal. Dry wheat bran or wheat germ.  · Foods high in purines: Pancakes, French toast, biscuits, muffins.  Vegetables  · Foods low in purines: All vegetables, except those that are moderate in purines.  · Foods moderate in purines: Asparagus, cauliflower, spinach, mushrooms, green peas.  Fruits  · All fruits are low in purines.  Meats and other Protein Foods  · Foods low in purines: Eggs, nuts, peanut butter.  · Foods moderate in purines: 80-90% lean beef, lamb, veal, pork, poultry, fish, eggs, peanut butter, nuts. Crab, lobster, oysters, and shrimp. Cooked dried beans, peas, and lentils.  · Foods high in purines: Anchovies, sardines, herring, mussels, tuna, codfish, scallops, trout, and haddock. Bacon. Organ meats (such as liver or kidney). Tripe. Game meat. Goose. Sweetbreads.  Dairy  · All dairy foods are low in purines. Low-fat and fat-free dairy products are best because they are low in saturated fat.  Beverages  · Drinks low in purines: Water, carbonated beverages, tea, coffee, cocoa.  · Drinks moderate in purines: Soft drinks and other drinks sweetened with high-fructose corn syrup. Juices. To find whether a food or drink is sweetened with high-fructose corn syrup, look at the ingredients list.  · Drinks high in purines: Alcoholic beverages (such as beer).  Condiments  · Foods   low in purines: Salt, herbs, olives, pickles, relishes, vinegar.  · Foods moderate in purines: Butter, margarine, oils, mayonnaise.  Fats and Oils  · Foods low in purines: All types, except gravies and sauces made with meat.  · Foods high in purines: Gravies and sauces made with meat.  Other Foods  · Foods low in purines: Sugars, sweets, gelatin. Cake. Soups made without meat.  · Foods moderate in purines: Meat-based or fish-based soups, broths, or bouillons. Foods and drinks sweetened with high-fructose corn syrup.  · Foods high in purines: High-fat desserts  (such as ice cream, cookies, cakes, pies, doughnuts, and chocolate).  Contact your dietitian for more information on foods that are not listed here.     This information is not intended to replace advice given to you by your health care provider. Make sure you discuss any questions you have with your health care provider.     Document Released: 05/17/2010 Document Revised: 01/25/2013 Document Reviewed: 12/27/2012  Elsevier Interactive Patient Education ©2016 [employer]

## 2015-09-29 LAB — URIC ACID: Uric Acid: 8.6 mg/dL — ABNORMAL HIGH (ref 2.5–7.1)

## 2015-10-01 ENCOUNTER — Other Ambulatory Visit: Payer: Self-pay | Admitting: Family Medicine

## 2015-10-01 ENCOUNTER — Other Ambulatory Visit: Payer: Self-pay

## 2015-10-01 MED ORDER — ALLOPURINOL 100 MG PO TABS: 100.0000 mg | ORAL_TABLET | Freq: Every day | ORAL | 1 refills | Status: DC

## 2015-11-05 ENCOUNTER — Encounter: Payer: Self-pay | Admitting: Family Medicine

## 2015-11-05 ENCOUNTER — Ambulatory Visit (INDEPENDENT_AMBULATORY_CARE_PROVIDER_SITE_OTHER): Payer: Medicare Other | Admitting: Family Medicine

## 2015-11-05 VITALS — BP 130/64 | HR 72 | Temp 98.9°F | Ht 64.0 in | Wt 175.0 lb

## 2015-11-05 DIAGNOSIS — R5081 Fever presenting with conditions classified elsewhere: Secondary | ICD-10-CM | POA: Diagnosis not present

## 2015-11-05 DIAGNOSIS — J45901 Unspecified asthma with (acute) exacerbation: Secondary | ICD-10-CM | POA: Diagnosis not present

## 2015-11-05 DIAGNOSIS — J40 Bronchitis, not specified as acute or chronic: Secondary | ICD-10-CM

## 2015-11-05 LAB — POCT INFLUENZA A/B
INFLUENZA A, POC: NEGATIVE
Influenza B, POC: NEGATIVE

## 2015-11-05 MED ORDER — AZITHROMYCIN 250 MG PO TABS: ORAL_TABLET | ORAL | 0 refills | Status: DC

## 2015-11-05 MED ORDER — PREDNISONE 10 MG PO TABS: 10.0000 mg | ORAL_TABLET | Freq: Every day | ORAL | 0 refills | Status: DC

## 2015-11-05 NOTE — Progress Notes (Signed)
Name: Kiara Becker   MRN: [id number]    DOB: 24-Mar-1945   Date:11/05/2015       Progress Note  Subjective  Chief Complaint  Chief Complaint  Patient presents with  . Sinusitis    cough and cong- no production, nasal drainage    Sinusitis  This is a new problem. The current episode started in the past 7 days (saturday). The problem has been gradually worsening since onset. Maximum temperature: 98,9. The pain is mild. Pertinent negatives include no chills, congestion, coughing, diaphoresis, ear pain, headaches, hoarse voice, neck pain, shortness of breath, sinus pressure, sneezing, sore throat or swollen glands. Past treatments include acetaminophen. The treatment provided mild relief.  Shortness of Breath  This is a chronic problem. The current episode started more than 1 year ago. The problem occurs every few minutes. The problem has been gradually worsening. Associated symptoms include a fever and wheezing. Pertinent negatives include no abdominal pain, chest pain, ear pain, headaches, hemoptysis, leg swelling, neck pain, rash, rhinorrhea, sore throat, sputum production or swollen glands. The symptoms are aggravated by weather changes. The patient has no known risk factors for DVT/PE. She has tried beta agonist inhalers for the symptoms. Her past medical history is significant for allergies and asthma. There is no history of aspirin allergies, bronchiolitis, CAD, chronic lung disease, COPD, DVT, a heart failure, PE, pneumonia or a recent surgery.    No problem-specific Assessment & Plan notes found for this encounter.   Past Medical History:  Diagnosis Date  . Allergy   . Asthma   . Cancer ([place]) 2001   melanoma  . Colon cancer ([place]) 2009  . Hyperlipidemia   . Hypertension   . Ventral hernia     Past Surgical History:  Procedure Laterality Date  . CHOLECYSTECTOMY  11/05/2010   Dr. Pat Patrick  . COLECTOMY  10/13/2007   Laparoscopic assisting ascending colectomy  . COLON SURGERY  2009    remove a mass  . COLONOSCOPY  03/2014   normal- cleared for 5 years- Dr Vira Agar  . GALLBLADDER SURGERY    . HERNIA REPAIR    . KNEE SURGERY Bilateral    menisacal repair and then replacement  . SKIN SURGERY     removal melanoma on arm and eye  . VAGINAL HYSTERECTOMY  1984    Family History  Problem Relation Age of Onset  . Cancer Mother   . Heart disease Mother   . Heart disease Father   . Heart disease Brother   . Drug abuse Maternal Grandfather     Social History   Social History  . Marital status: Married    Spouse name: N/A  . Number of children: N/A  . Years of education: N/A   Occupational History  . Not on file.   Social History Main Topics  . Smoking status: Never Smoker  . Smokeless tobacco: Never Used  . Alcohol use No  . Drug use: No  . Sexual activity: Yes    Birth control/ protection: None   Other Topics Concern  . Not on file   Social History Narrative  . No narrative on file    Allergies  Allergen Reactions  . Choline Fenofibrate Other (See Comments)    Roaring in ears  . Codeine Other (See Comments)    Unknown  . Codeine Sulfate Other (See Comments)  . Ezetimibe Other (See Comments)    Roaring in ears  . Fenofibrate Other (See Comments)    Roaring in  ears  . Fenofibric Acid Other (See Comments)    Roaring in ears  . Gemfibrozil Other (See Comments)     Review of Systems  Constitutional: Positive for fever. Negative for chills, diaphoresis, malaise/fatigue and weight loss.  HENT: Positive for hearing loss. Negative for congestion, ear discharge, ear pain, hoarse voice, rhinorrhea, sinus pressure, sneezing, sore throat and tinnitus.   Eyes: Negative for blurred vision.  Respiratory: Positive for wheezing. Negative for cough, hemoptysis, sputum production and shortness of breath.   Cardiovascular: Negative for chest pain, palpitations and leg swelling.  Gastrointestinal: Negative for abdominal pain, blood in stool, constipation,  diarrhea, heartburn, melena and nausea.  Genitourinary: Negative for dysuria, frequency, hematuria and urgency.  Musculoskeletal: Negative for back pain, joint pain, myalgias and neck pain.  Skin: Negative for rash.  Neurological: Negative for dizziness, tingling, sensory change, focal weakness and headaches.  Endo/Heme/Allergies: Negative for environmental allergies and polydipsia. Does not bruise/bleed easily.  Psychiatric/Behavioral: Negative for depression and suicidal ideas. The patient is not nervous/anxious and does not have insomnia.      Objective  Vitals:   11/05/15 1117  BP: 130/64  Pulse: 72  Temp: 98.9 F (37.2 C)  TempSrc: Oral  Weight: 175 lb (79.4 kg)  Height: 5\' 4"  (1.626 m)    Physical Exam  Constitutional: She is well-developed, well-nourished, and in no distress. No distress.  HENT:  Head: Normocephalic and atraumatic.  Right Ear: External ear normal.  Left Ear: External ear normal.  Nose: Nose normal.  Mouth/Throat: Oropharynx is clear and moist.  Eyes: Conjunctivae and EOM are normal. Pupils are equal, round, and reactive to light. Right eye exhibits no discharge. Left eye exhibits no discharge.  Neck: Normal range of motion. Neck supple. No JVD present. No thyromegaly present.  Cardiovascular: Normal rate, regular rhythm, normal heart sounds and intact distal pulses.  Exam reveals no gallop and no friction rub.   No murmur heard. Pulmonary/Chest: Effort normal. No respiratory distress. She has wheezes. She has no rales. She exhibits no tenderness.  Abdominal: Soft. Bowel sounds are normal. She exhibits no mass. There is no tenderness. There is no guarding.  Musculoskeletal: Normal range of motion. She exhibits no edema.  Lymphadenopathy:    She has no cervical adenopathy.  Neurological: She is alert. She has normal reflexes.  Skin: Skin is warm and dry. She is not diaphoretic.  Psychiatric: Mood and affect normal.  Nursing note and vitals  reviewed.     Assessment & Plan  Problem List Items Addressed This Visit      Respiratory   Moderate asthma with acute exacerbation   Relevant Medications   predniSONE (DELTASONE) 10 MG tablet    Other Visit Diagnoses    Fever in other diseases    -  Primary   Relevant Orders   POCT Influenza A/B (Completed)   Bronchitis       Relevant Medications   azithromycin (ZITHROMAX) 250 MG tablet     I spent 25 minutes with this patient, More than 50% of that time was spent in face to face education, counseling and care coordination.   Dr. [facility]  11/05/15

## 2015-12-05 DIAGNOSIS — H43812 Vitreous degeneration, left eye: Secondary | ICD-10-CM | POA: Diagnosis not present

## 2015-12-05 DIAGNOSIS — H2513 Age-related nuclear cataract, bilateral: Secondary | ICD-10-CM | POA: Diagnosis not present

## 2015-12-14 DIAGNOSIS — E785 Hyperlipidemia, unspecified: Secondary | ICD-10-CM | POA: Diagnosis not present

## 2015-12-20 ENCOUNTER — Encounter: Payer: Medicare Other | Attending: Nurse Practitioner | Admitting: Dietician

## 2015-12-20 ENCOUNTER — Encounter: Payer: Self-pay | Admitting: Dietician

## 2015-12-20 VITALS — Ht 64.0 in | Wt 177.9 lb

## 2015-12-20 DIAGNOSIS — Z6830 Body mass index (BMI) 30.0-30.9, adult: Secondary | ICD-10-CM | POA: Diagnosis not present

## 2015-12-20 DIAGNOSIS — Z713 Dietary counseling and surveillance: Secondary | ICD-10-CM | POA: Insufficient documentation

## 2015-12-20 DIAGNOSIS — K9041 Non-celiac gluten sensitivity: Secondary | ICD-10-CM | POA: Insufficient documentation

## 2015-12-20 NOTE — Patient Instructions (Addendum)
Balance meals with 1-3 oz. Of protein, 2-3 servings of carbohydrate (fruit, starch, milk/yogurt) and non-starchy vegetables. Include at least 6 oz. Of protein daily. Read all labels for gluten -can refer to safe and non-safe additives. Include at least 5 servings of fruits/vegetables daily. Read labels for saturated fat and trans fat with goal of no more than 10 gms of saturated and 0 gms of trans fat. Limit total fat to no more than 45-50 gms daily.

## 2015-12-20 NOTE — Progress Notes (Signed)
Medical Nutrition Therapy: Visit start time: 10:20 end time: 11:25am Assessment:  Diagnosis: gluten intolerance Past medical history: gout, hypertension, elevated triglycerides Psychosocial issues/ stress concerns: none identified Preferred learning method:  . Auditory . Visual  Current weight: 177.9 lbs  Height: 64 in Medications, supplements: see list Progress and evaluation: Patient came for initial medical nutrition therapy appointment. She has been following a gluten free diet due to chronic diarrhea and reports that bowel movement frequency and consistency are "back to normal". She states she would like to know gluten containing ingredients listed on labels whose names do not obviously indicate gluten. She states she would like to lose weight and also lower her triglycerides which were 309 in her most recent labwork. She reports her highest weight was 229 lbs approximately 7 years ago when mobility was limited due to knee pain. She reports her weight has fluctuated between 175-180 lbs  in the past year. She reports a history of impaired glucose tolerance. She is presently eating 3 meals daily. She limits red meats but includes chicken and fish. Based on food frequency, her diet may be low in protein. She likes vegetables and includes 2-3 servings daily and 1 serving of fruit daily. She states she does not often eat sweets but loves salty snacks. Her beverages are water, coffee, unsweetened green tea; no beverages with sugar. Her diet is low in milk products but she takes a calcium supplement of 600mg  of calcium.  Physical activity: Walks 3-4 days per week for 1 hour.   Nutrition Care Education: Gluten free: Gave and reviewed foods allowed and those that contain gluten to avoid. Gave a list of safe and unsafe additives. Also, gave website that list GF products by category.   Weight control: Used food models and food guide plate to instruct on how to better balance meals to meet basic  nutrient needs based on a 1400-1500 calorie meal plan. Hypertriglyceridemia: Discussed how above meal plan incorporates dietary guidelines to lower triglycerides.  Nutritional Diagnosis:  NI-5.11.1 Predicted suboptimal nutrient intake As related to inadequate intake of protein foods and calcium sources.  As evidenced by diet history..  Intervention: Balance meals with 1-3 oz. Of protein, 2-3 servings of carbohydrate (fruit, starch, milk/yogurt) and non-starchy vegetables. Include at least 6 oz. Of protein daily. Read all labels for gluten -can refer to safe and non-safe additives. Include at least 5 servings of fruits/vegetables daily. Read labels for saturated fat and trans fat with goal of no more than 10 gms of saturated and 0 gms of trans fat. Limit total fat to no more than 45-50 gms daily.  Education Materials given:  . General diet guidelines for lowering triglycerides. .  Plate Planner . Food lists/ Planning A Balanced Meal . Sample meal pattern/ menus . Gluten Free Guide for Families . List of safe and gluten containing additives . Goals/ instructions  Learner/ who was taught:  . Patient  Level of understanding: Verbalizes understanding Learning barriers: . None .  Willingness to learn/ readiness for change: . Eager, change in progress  Monitoring and Evaluation:  No follow-up scheduled. Patient to call if desires further help with her diet/nutrition.

## 2015-12-24 DIAGNOSIS — R600 Localized edema: Secondary | ICD-10-CM | POA: Diagnosis not present

## 2015-12-24 DIAGNOSIS — E782 Mixed hyperlipidemia: Secondary | ICD-10-CM | POA: Diagnosis not present

## 2016-01-02 DIAGNOSIS — E785 Hyperlipidemia, unspecified: Secondary | ICD-10-CM | POA: Diagnosis not present

## 2016-01-02 DIAGNOSIS — Z885 Allergy status to narcotic agent status: Secondary | ICD-10-CM | POA: Diagnosis not present

## 2016-01-02 DIAGNOSIS — Z8249 Family history of ischemic heart disease and other diseases of the circulatory system: Secondary | ICD-10-CM | POA: Diagnosis not present

## 2016-01-02 DIAGNOSIS — M545 Low back pain: Secondary | ICD-10-CM | POA: Diagnosis not present

## 2016-01-02 DIAGNOSIS — N2889 Other specified disorders of kidney and ureter: Secondary | ICD-10-CM | POA: Diagnosis not present

## 2016-01-02 DIAGNOSIS — N8111 Cystocele, midline: Secondary | ICD-10-CM | POA: Diagnosis not present

## 2016-01-02 DIAGNOSIS — I10 Essential (primary) hypertension: Secondary | ICD-10-CM | POA: Diagnosis not present

## 2016-01-02 DIAGNOSIS — Z720 Tobacco use: Secondary | ICD-10-CM | POA: Diagnosis not present

## 2016-01-07 DIAGNOSIS — N2889 Other specified disorders of kidney and ureter: Secondary | ICD-10-CM | POA: Diagnosis not present

## 2016-01-07 DIAGNOSIS — N289 Disorder of kidney and ureter, unspecified: Secondary | ICD-10-CM | POA: Diagnosis not present

## 2016-01-07 DIAGNOSIS — K760 Fatty (change of) liver, not elsewhere classified: Secondary | ICD-10-CM | POA: Diagnosis not present

## 2016-02-08 DIAGNOSIS — Z885 Allergy status to narcotic agent status: Secondary | ICD-10-CM | POA: Diagnosis not present

## 2016-02-08 DIAGNOSIS — Z888 Allergy status to other drugs, medicaments and biological substances status: Secondary | ICD-10-CM | POA: Diagnosis not present

## 2016-02-08 DIAGNOSIS — N2889 Other specified disorders of kidney and ureter: Secondary | ICD-10-CM | POA: Diagnosis not present

## 2016-02-22 DIAGNOSIS — N2889 Other specified disorders of kidney and ureter: Secondary | ICD-10-CM | POA: Diagnosis not present

## 2016-03-04 ENCOUNTER — Ambulatory Visit (INDEPENDENT_AMBULATORY_CARE_PROVIDER_SITE_OTHER): Payer: Medicare Other | Admitting: Family Medicine

## 2016-03-04 ENCOUNTER — Ambulatory Visit: Payer: Medicare Other | Admitting: Family Medicine

## 2016-03-05 ENCOUNTER — Encounter: Payer: Self-pay | Admitting: Family Medicine

## 2016-03-05 ENCOUNTER — Ambulatory Visit (INDEPENDENT_AMBULATORY_CARE_PROVIDER_SITE_OTHER): Payer: Medicare Other | Admitting: Family Medicine

## 2016-03-05 VITALS — BP 100/80 | HR 90 | Temp 99.8°F | Ht 64.0 in | Wt 177.0 lb

## 2016-03-05 DIAGNOSIS — R69 Illness, unspecified: Secondary | ICD-10-CM

## 2016-03-05 DIAGNOSIS — J111 Influenza due to unidentified influenza virus with other respiratory manifestations: Secondary | ICD-10-CM

## 2016-03-05 MED ORDER — OSELTAMIVIR PHOSPHATE 75 MG PO CAPS: 75.0000 mg | ORAL_CAPSULE | Freq: Two times a day (BID) | ORAL | 0 refills | Status: DC

## 2016-03-05 MED ORDER — BENZONATATE 100 MG PO CAPS: 100.0000 mg | ORAL_CAPSULE | Freq: Two times a day (BID) | ORAL | 0 refills | Status: DC | PRN

## 2016-03-05 NOTE — Progress Notes (Signed)
Name: Kiara Becker   MRN: [id number]    DOB: May 25, 1945   Date:03/05/2016       Progress Note  Subjective  Chief Complaint  Chief Complaint  Patient presents with  . Sinusitis    drainage, vomitting, cough with white production, "can't keep voice clear"    Fever   This is a chronic problem. The current episode started in the past 7 days. The problem occurs daily. The problem has been gradually worsening. Her temperature was unmeasured prior to arrival. Associated symptoms include congestion, coughing, diarrhea and muscle aches. Pertinent negatives include no abdominal pain, chest pain, ear pain, headaches, nausea, rash, sleepiness, sore throat, urinary pain, vomiting or wheezing. Treatments tried: nyquil. The treatment provided no relief.  Cough  This is a new problem. The current episode started in the past 7 days. The problem has been gradually worsening. The cough is non-productive. Associated symptoms include chills and a fever. Pertinent negatives include no chest pain, ear pain, headaches, heartburn, myalgias, rash, sore throat, shortness of breath, weight loss or wheezing. She has tried OTC cough suppressant for the symptoms. The treatment provided no relief. There is no history of environmental allergies.  Sinusitis  This is a chronic problem. The current episode started more than 1 year ago. The problem has been waxing and waning since onset. The pain is mild. Associated symptoms include chills, congestion, coughing, a hoarse voice and sinus pressure. Pertinent negatives include no diaphoresis, ear pain, headaches, neck pain, shortness of breath, sneezing, sore throat or swollen glands. Past treatments include oral decongestants. The treatment provided no relief.    No problem-specific Assessment & Plan notes found for this encounter.   Past Medical History:  Diagnosis Date  . Allergy   . Asthma   . Cancer ([employer]) 2001   melanoma  . Colon cancer ([place]) 2009  . Hyperlipidemia   .  Hypertension   . Ventral hernia     Past Surgical History:  Procedure Laterality Date  . CHOLECYSTECTOMY  11/05/2010   Dr. Pat Patrick  . COLECTOMY  10/13/2007   Laparoscopic assisting ascending colectomy  . COLON SURGERY  2009   remove a mass  . COLONOSCOPY  03/2014   normal- cleared for 5 years- Dr Vira Agar  . GALLBLADDER SURGERY    . HERNIA REPAIR    . KNEE SURGERY Bilateral    menisacal repair and then replacement  . SKIN SURGERY     removal melanoma on arm and eye  . VAGINAL HYSTERECTOMY  1984    Family History  Problem Relation Age of Onset  . Cancer Mother   . Heart disease Mother   . Heart disease Father   . Heart disease Brother   . Drug abuse Maternal Grandfather     Social History   Social History  . Marital status: Married    Spouse name: N/A  . Number of children: N/A  . Years of education: N/A   Occupational History  . Not on file.   Social History Main Topics  . Smoking status: Never Smoker  . Smokeless tobacco: Never Used  . Alcohol use No  . Drug use: No  . Sexual activity: Yes    Birth control/ protection: None   Other Topics Concern  . Not on file   Social History Narrative  . No narrative on file    Allergies  Allergen Reactions  . Choline Fenofibrate Other (See Comments)    Roaring in ears  . Codeine Other (See Comments)  Unknown  . Codeine Sulfate Other (See Comments)  . Ezetimibe Other (See Comments)    Roaring in ears  . Fenofibrate Other (See Comments)    Roaring in ears  . Fenofibric Acid Other (See Comments)    Roaring in ears  . Gemfibrozil Other (See Comments)     Review of Systems  Constitutional: Positive for chills and fever. Negative for diaphoresis, malaise/fatigue and weight loss.  HENT: Positive for congestion, hoarse voice and sinus pressure. Negative for ear discharge, ear pain, sneezing, sore throat and tinnitus.   Eyes: Negative for blurred vision.  Respiratory: Positive for cough. Negative for sputum  production, shortness of breath and wheezing.   Cardiovascular: Negative for chest pain, palpitations and leg swelling.  Gastrointestinal: Positive for diarrhea. Negative for abdominal pain, blood in stool, constipation, heartburn, melena, nausea and vomiting.  Genitourinary: Negative for dysuria, frequency, hematuria and urgency.  Musculoskeletal: Negative for back pain, joint pain, myalgias and neck pain.  Skin: Negative for rash.  Neurological: Negative for dizziness, tingling, sensory change, focal weakness and headaches.  Endo/Heme/Allergies: Negative for environmental allergies and polydipsia. Does not bruise/bleed easily.  Psychiatric/Behavioral: Negative for depression and suicidal ideas. The patient is not nervous/anxious and does not have insomnia.      Objective  Vitals:   03/05/16 0930  BP: 100/80  Pulse: 90  Temp: 99.8 F (37.7 C)  TempSrc: Oral  Weight: 177 lb (80.3 kg)  Height: 5\' 4"  (1.626 m)    Physical Exam  Constitutional: She is well-developed, well-nourished, and in no distress. No distress.  HENT:  Head: Normocephalic and atraumatic.  Right Ear: External ear normal.  Left Ear: External ear normal.  Nose: Nose normal.  Mouth/Throat: Oropharynx is clear and moist.  Eyes: Conjunctivae and EOM are normal. Pupils are equal, round, and reactive to light. Right eye exhibits no discharge. Left eye exhibits no discharge.  Neck: Normal range of motion. Neck supple. No JVD present. No thyromegaly present.  Cardiovascular: Normal rate, regular rhythm, normal heart sounds and intact distal pulses.  Exam reveals no gallop and no friction rub.   No murmur heard. Pulmonary/Chest: Effort normal and breath sounds normal. She has no wheezes. She has no rales.  Abdominal: Soft. Bowel sounds are normal. She exhibits no mass. There is no tenderness. There is no guarding.  Musculoskeletal: Normal range of motion. She exhibits no edema.  Lymphadenopathy:    She has no cervical  adenopathy.  Neurological: She is alert. She has normal reflexes.  Skin: Skin is warm and dry. She is not diaphoretic.  Psychiatric: Mood and affect normal.  Nursing note and vitals reviewed.     Assessment & Plan  Problem List Items Addressed This Visit    None    Visit Diagnoses    Influenza-like illness    -  Primary   Relevant Medications   oseltamivir (TAMIFLU) 75 MG capsule   benzonatate (TESSALON) 100 MG capsule        Dr. [facility]  03/05/16

## 2016-03-24 ENCOUNTER — Other Ambulatory Visit: Payer: Self-pay | Admitting: Family Medicine

## 2016-03-31 DIAGNOSIS — N2889 Other specified disorders of kidney and ureter: Secondary | ICD-10-CM | POA: Diagnosis not present

## 2016-04-16 DIAGNOSIS — Z85820 Personal history of malignant melanoma of skin: Secondary | ICD-10-CM | POA: Diagnosis not present

## 2016-04-16 DIAGNOSIS — Z859 Personal history of malignant neoplasm, unspecified: Secondary | ICD-10-CM | POA: Diagnosis not present

## 2016-04-16 DIAGNOSIS — Z872 Personal history of diseases of the skin and subcutaneous tissue: Secondary | ICD-10-CM | POA: Diagnosis not present

## 2016-04-16 DIAGNOSIS — L570 Actinic keratosis: Secondary | ICD-10-CM | POA: Diagnosis not present

## 2016-04-16 DIAGNOSIS — L820 Inflamed seborrheic keratosis: Secondary | ICD-10-CM | POA: Diagnosis not present

## 2016-04-16 DIAGNOSIS — Z86018 Personal history of other benign neoplasm: Secondary | ICD-10-CM | POA: Diagnosis not present

## 2016-06-12 ENCOUNTER — Other Ambulatory Visit: Payer: Self-pay | Admitting: Family Medicine

## 2016-06-12 DIAGNOSIS — Z1231 Encounter for screening mammogram for malignant neoplasm of breast: Secondary | ICD-10-CM

## 2016-06-23 ENCOUNTER — Telehealth: Payer: Self-pay | Admitting: Family Medicine

## 2016-06-23 NOTE — Telephone Encounter (Signed)
Called pt to schedule Annual Wellness Visit with Nurse Health Advisor for June:  - knb ° °

## 2016-07-17 ENCOUNTER — Ambulatory Visit: Payer: Medicare Other

## 2016-07-22 ENCOUNTER — Ambulatory Visit
Admission: RE | Admit: 2016-07-22 | Discharge: 2016-07-22 | Disposition: A | Discharge status: Home / Self Care | Payer: Medicare Other | Source: Ambulatory Visit | Attending: Family Medicine | Admitting: Family Medicine

## 2016-07-22 DIAGNOSIS — Z1231 Encounter for screening mammogram for malignant neoplasm of breast: Secondary | ICD-10-CM | POA: Diagnosis not present

## 2016-07-25 ENCOUNTER — Encounter: Payer: Self-pay | Admitting: Family Medicine

## 2016-08-04 ENCOUNTER — Ambulatory Visit (INDEPENDENT_AMBULATORY_CARE_PROVIDER_SITE_OTHER): Payer: Medicare Other

## 2016-08-04 VITALS — BP 132/70 | HR 78 | Temp 98.1°F | Resp 16 | Ht 64.0 in | Wt 181.6 lb

## 2016-08-04 DIAGNOSIS — Z13820 Encounter for screening for osteoporosis: Secondary | ICD-10-CM

## 2016-08-04 DIAGNOSIS — Z1159 Encounter for screening for other viral diseases: Secondary | ICD-10-CM | POA: Diagnosis not present

## 2016-08-04 DIAGNOSIS — Z0000 Encounter for general adult medical examination without abnormal findings: Secondary | ICD-10-CM | POA: Diagnosis not present

## 2016-08-04 NOTE — Progress Notes (Signed)
Subjective:   Kiara Becker is a 71 y.o. female who presents for Medicare Annual (Subsequent) preventive examination.  Review of Systems:   Cardiac Risk Factors include: hypertension;obesity (BMI >30kg/m2);advanced age (>75men, >20 women);dyslipidemia     Objective:     Vitals: BP 132/70 (BP Location: Right Arm, Patient Position: Sitting)   Pulse 78   Temp 98.1 F (36.7 C)   Resp 16   Ht 5\' 4"  (1.626 m)   Wt 181 lb 9.6 oz (82.4 kg)   BMI 31.17 kg/m   Body mass index is 31.17 kg/m.   Tobacco History  Smoking Status  . Never Smoker  Smokeless Tobacco  . Never Used     Counseling given: Not Answered   Past Medical History:  Diagnosis Date  . Allergy   . Asthma   . Cancer ([place]) 2001   melanoma  . Colon cancer ([place]) 2009  . Hyperlipidemia   . Hypertension   . Ventral hernia    Past Surgical History:  Procedure Laterality Date  . CHOLECYSTECTOMY  11/05/2010   Dr. Pat Patrick  . COLECTOMY  10/13/2007   Laparoscopic assisting ascending colectomy  . COLON SURGERY  2009   remove a mass  . COLONOSCOPY  03/2014   normal- cleared for 5 years- Dr Vira Agar  . GALLBLADDER SURGERY    . HERNIA REPAIR    . KNEE SURGERY Bilateral    menisacal repair and then replacement  . SKIN SURGERY     removal melanoma on arm and eye  . VAGINAL HYSTERECTOMY  1984   Family History  Problem Relation Age of Onset  . Heart disease Mother   . Liver cancer Mother   . Cancer Mother   . Heart disease Father   . Heart disease Brother   . Drug abuse Maternal Grandfather    History  Sexual Activity  . Sexual activity: Yes  . Birth control/ protection: None    Outpatient Encounter Prescriptions as of 08/04/2016  Medication Sig  . albuterol (PROVENTIL HFA;VENTOLIN HFA) 108 (90 Base) MCG/ACT inhaler Inhale 2 puffs into the lungs every 6 (six) hours as needed for wheezing or shortness of breath.  . allopurinol (ZYLOPRIM) 100 MG tablet TAKE 1 TABLET(100 MG) BY MOUTH DAILY  . Calcium  Carbonate-Vitamin D (CALTRATE 600+D PO) Take 1 tablet by mouth daily.  . cholecalciferol (VITAMIN D) 400 units TABS tablet Take 2,000 Units by mouth.  Koren Bound Oil OIL by Does not apply route daily.  . meclizine (ANTIVERT) 25 MG tablet Take 1 tablet (25 mg total) by mouth 3 (three) times daily as needed for dizziness.  . Multiple Vitamins-Minerals (CENTRUM SILVER ULTRA WOMENS PO) Take 1 tablet by mouth daily.  . Red Yeast Rice 600 MG CAPS Take by mouth.  . torsemide (DEMADEX) 10 MG tablet Take 10 mg by mouth as needed (twice a week as needed for ankle swelling). Dr Nehemiah Massed  . triamterene-hydrochlorothiazide (MAXZIDE-25) 37.5-25 MG tablet Take 0.5 tablets by mouth as directed. 3 times qweek- Dr Nehemiah Massed  . Orange Oil OIL Use as directed in the mouth or throat daily.  . [DISCONTINUED] allopurinol (ZYLOPRIM) 100 MG tablet TAKE 1 TABLET(100 MG) BY MOUTH DAILY  . [DISCONTINUED] b complex vitamins tablet Take 1 tablet by mouth daily.  . [DISCONTINUED] benzonatate (TESSALON) 100 MG capsule Take 1 capsule (100 mg total) by mouth 2 (two) times daily as needed for cough. (Patient not taking: Reported on 08/04/2016)  . [DISCONTINUED] colchicine 0.6 MG tablet Take 1 tablet (  0.6 mg total) by mouth daily. (Patient not taking: Reported on 03/05/2016)  . [DISCONTINUED] fluticasone (FLONASE) 50 MCG/ACT nasal spray Place 2 sprays into the nose daily.  . [DISCONTINUED] oseltamivir (TAMIFLU) 75 MG capsule Take 1 capsule (75 mg total) by mouth 2 (two) times daily. (Patient not taking: Reported on 08/04/2016)   Facility-Administered Encounter Medications as of 08/04/2016  Medication  . albuterol (PROVENTIL) (2.5 MG/3ML) 0.083% nebulizer solution 2.5 mg    Activities of Daily Living In your present state of health, do you have any difficulty performing the following activities: 08/04/2016  Hearing? Y  Vision? N  Difficulty concentrating or making decisions? N  Walking or climbing stairs? N  Dressing or bathing? N  Doing  errands, shopping? N  Preparing Food and eating ? N  Using the Toilet? N  In the past six months, have you accidently leaked urine? N  Do you have problems with loss of bowel control? N  Managing your Medications? N  Managing your Finances? N  Housekeeping or managing your Housekeeping? N  Some recent data might be hidden    Patient Care Team: Juline Patch, MD as PCP - General (Family Medicine) Murrell Redden, MD (Urology) Jannet Mantis, MD (Dermatology)    Assessment:     Exercise Activities and Dietary recommendations Current Exercise Habits: Home exercise routine, Type of exercise: walking, Time (Minutes): 45, Frequency (Times/Week): 7, Weekly Exercise (Minutes/Week): 315, Intensity: Moderate, Exercise limited by: None identified  Goals    . Increase water intake          Continue drinking at least 6-7 glasses of water a day      Fall Risk Fall Risk  08/04/2016 12/20/2015 07/25/2015 05/01/2015 03/01/2015  Falls in the past year? No No No No No   Depression Screen PHQ 2/9 Scores 08/04/2016 12/20/2015 07/25/2015 05/01/2015  PHQ - 2 Score 0 0 0 0     Cognitive Function     6CIT Screen 08/04/2016  What Year? 0 points  What month? 0 points  What time? 0 points  Count back from 20 0 points  Months in reverse 0 points  Repeat phrase 2 points  Total Score 2    Immunization History  Administered Date(s) Administered  . Influenza,inj,Quad PF,36+ Mos 12/04/2014  . Pneumococcal Conjugate-13 07/25/2015  . Pneumococcal Polysaccharide-23 06/24/2012  . Tdap 06/24/2012  . Zoster 06/04/2014   Screening Tests Health Maintenance  Topic Date Due  . Hepatitis C Screening  08/10/2016 (Originally April 03, 1945)  . INFLUENZA VACCINE  09/03/2016  . MAMMOGRAM  07/23/2018  . TETANUS/TDAP  06/25/2022  . COLONOSCOPY  03/06/2025  . DEXA SCAN  Completed  . PNA vac Low Risk Adult  Completed      Plan:     I have personally reviewed and addressed the Medicare Annual Wellness  questionnaire and have noted the following in the patient's chart:  A. Medical and social history B. Use of alcohol, tobacco or illicit drugs  C. Current medications and supplements D. Functional ability and status E.  Nutritional status F.  Physical activity G. Advance directives H. List of other physicians I.  Hospitalizations, surgeries, and ER visits in previous 12 months J.  [place] such as hearing and vision if needed, cognitive and depression L. Referrals and appointments  In addition, I have reviewed and discussed with patient certain preventive protocols, quality metrics, and best practice recommendations. A written personalized care plan for preventive services as well as general preventive health  recommendations were provided to patient.   Signed,  Tyler Aas, LPN Nurse Health Advisor   MD Recommendations: none

## 2016-08-04 NOTE — Patient Instructions (Signed)
Kiara Becker , Thank you for taking time to come for your Medicare Wellness Visit. I appreciate your ongoing commitment to your health goals. Please review the following plan we discussed and let me know if I can assist you in the future.   Screening recommendations/referrals: Colonoscopy: Completed 03/07/2015 Mammogram: Completed 07/22/2016 Bone Density: Completed 07/05/2013 Recommended yearly ophthalmology/optometry visit for glaucoma screening and checkup Recommended yearly dental visit for hygiene and checkup  Vaccinations: Influenza vaccine: Due 10/2016 Pneumococcal vaccine: Up to date Tdap vaccine: up to date  Shingles vaccine: up to date    Advanced directives: Advance directive discussed with you today. I have provided a copy for you to complete at home and have notarized. Once this is complete please bring a copy in to our office so we can scan it into your chart.  Conditions/risks identified: Continue drinking at least 6-7 glasses of water a day  Next appointment: Follow up with Dr.Jones on 08/11/2016 at 9:30am. Follow up in one year for your annual wellness exam.    Preventive Care 65 Years and Older, Female Preventive care refers to lifestyle choices and visits with your health care provider that can promote health and wellness. What does preventive care include?  A yearly physical exam. This is also called an annual well check.  Dental exams once or twice a year.  Routine eye exams. Ask your health care provider how often you should have your eyes checked.  Personal lifestyle choices, including:  Daily care of your teeth and gums.  Regular physical activity.  Eating a healthy diet.  Avoiding tobacco and drug use.  Limiting alcohol use.  Practicing safe sex.  Taking low-dose aspirin every day.  Taking vitamin and mineral supplements as recommended by your health care provider. What happens during an annual well check? The services and screenings done by your  health care provider during your annual well check will depend on your age, overall health, lifestyle risk factors, and family history of disease. Counseling  Your health care provider may ask you questions about your:  Alcohol use.  Tobacco use.  Drug use.  Emotional well-being.  Home and relationship well-being.  Sexual activity.  Eating habits.  History of falls.  Memory and ability to understand (cognition).  Work and work Statistician.  Reproductive health. Screening  You may have the following tests or measurements:  Height, weight, and BMI.  Blood pressure.  Lipid and cholesterol levels. These may be checked every 5 years, or more frequently if you are over 3 years old.  Skin check.  Lung cancer screening. You may have this screening every year starting at age 23 if you have a 30-pack-year history of smoking and currently smoke or have quit within the past 15 years.  Fecal occult blood test (FOBT) of the stool. You may have this test every year starting at age 77.  Flexible sigmoidoscopy or colonoscopy. You may have a sigmoidoscopy every 5 years or a colonoscopy every 10 years starting at age 51.  Hepatitis C blood test.  Hepatitis B blood test.  Sexually transmitted disease (STD) testing.  Diabetes screening. This is done by checking your blood sugar (glucose) after you have not eaten for a while (fasting). You may have this done every 1-3 years.  Bone density scan. This is done to screen for osteoporosis. You may have this done starting at age 79.  Mammogram. This may be done every 1-2 years. Talk to your health care provider about how often you should have  regular mammograms. Talk with your health care provider about your test results, treatment options, and if necessary, the need for more tests. Vaccines  Your health care provider may recommend certain vaccines, such as:  Influenza vaccine. This is recommended every year.  Tetanus, diphtheria, and  acellular pertussis (Tdap, Td) vaccine. You may need a Td booster every 10 years.  Zoster vaccine. You may need this after age 75.  Pneumococcal 13-valent conjugate (PCV13) vaccine. One dose is recommended after age 12.  Pneumococcal polysaccharide (PPSV23) vaccine. One dose is recommended after age 50. Talk to your health care provider about which screenings and vaccines you need and how often you need them. This information is not intended to replace advice given to you by your health care provider. Make sure you discuss any questions you have with your health care provider. Document Released: 02/16/2015 Document Revised: 10/10/2015 Document Reviewed: 11/21/2014 Elsevier Interactive Patient Education  2017 [place] Prevention in the Home Falls can cause injuries. They can happen to people of all ages. There are many things you can do to make your home safe and to help prevent falls. What can I do on the outside of my home?  Regularly fix the edges of walkways and driveways and fix any cracks.  Remove anything that might make you trip as you walk through a door, such as a raised step or threshold.  Trim any bushes or trees on the path to your home.  Use bright outdoor lighting.  Clear any walking paths of anything that might make someone trip, such as rocks or tools.  Regularly check to see if handrails are loose or broken. Make sure that both sides of any steps have handrails.  Any raised decks and porches should have guardrails on the edges.  Have any leaves, snow, or ice cleared regularly.  Use sand or salt on walking paths during winter.  Clean up any spills in your garage right away. This includes oil or grease spills. What can I do in the bathroom?  Use night lights.  Install grab bars by the toilet and in the tub and shower. Do not use towel bars as grab bars.  Use non-skid mats or decals in the tub or shower.  If you need to sit down in the shower, use  a plastic, non-slip stool.  Keep the floor dry. Clean up any water that spills on the floor as soon as it happens.  Remove soap buildup in the tub or shower regularly.  Attach bath mats securely with double-sided non-slip rug tape.  Do not have throw rugs and other things on the floor that can make you trip. What can I do in the bedroom?  Use night lights.  Make sure that you have a light by your bed that is easy to reach.  Do not use any sheets or blankets that are too big for your bed. They should not hang down onto the floor.  Have a firm chair that has side arms. You can use this for support while you get dressed.  Do not have throw rugs and other things on the floor that can make you trip. What can I do in the kitchen?  Clean up any spills right away.  Avoid walking on wet floors.  Keep items that you use a lot in easy-to-reach places.  If you need to reach something above you, use a strong step stool that has a grab bar.  Keep electrical cords out of the  way.  Do not use floor polish or wax that makes floors slippery. If you must use wax, use non-skid floor wax.  Do not have throw rugs and other things on the floor that can make you trip. What can I do with my stairs?  Do not leave any items on the stairs.  Make sure that there are handrails on both sides of the stairs and use them. Fix handrails that are broken or loose. Make sure that handrails are as long as the stairways.  Check any carpeting to make sure that it is firmly attached to the stairs. Fix any carpet that is loose or worn.  Avoid having throw rugs at the top or bottom of the stairs. If you do have throw rugs, attach them to the floor with carpet tape.  Make sure that you have a light switch at the top of the stairs and the bottom of the stairs. If you do not have them, ask someone to add them for you. What else can I do to help prevent falls?  Wear shoes that:  Do not have high heels.  Have  rubber bottoms.  Are comfortable and fit you well.  Are closed at the toe. Do not wear sandals.  If you use a stepladder:  Make sure that it is fully opened. Do not climb a closed stepladder.  Make sure that both sides of the stepladder are locked into place.  Ask someone to hold it for you, if possible.  Clearly mark and make sure that you can see:  Any grab bars or handrails.  First and last steps.  Where the edge of each step is.  Use tools that help you move around (mobility aids) if they are needed. These include:  Canes.  Walkers.  Scooters.  Crutches.  Turn on the lights when you go into a dark area. Replace any light bulbs as soon as they burn out.  Set up your furniture so you have a clear path. Avoid moving your furniture around.  If any of your floors are uneven, fix them.  If there are any pets around you, be aware of where they are.  Review your medicines with your doctor. Some medicines can make you feel dizzy. This can increase your chance of falling. Ask your doctor what other things that you can do to help prevent falls. This information is not intended to replace advice given to you by your health care provider. Make sure you discuss any questions you have with your health care provider. Document Released: 11/16/2008 Document Revised: 06/28/2015 Document Reviewed: 02/24/2014 Elsevier Interactive Patient Education  2017 [employer].

## 2016-08-08 ENCOUNTER — Encounter: Payer: Self-pay | Admitting: Family Medicine

## 2016-08-11 ENCOUNTER — Ambulatory Visit (INDEPENDENT_AMBULATORY_CARE_PROVIDER_SITE_OTHER): Payer: Medicare Other | Admitting: Family Medicine

## 2016-08-11 ENCOUNTER — Encounter: Payer: Self-pay | Admitting: Family Medicine

## 2016-08-11 VITALS — BP 124/70 | HR 68 | Ht 64.0 in | Wt 181.0 lb

## 2016-08-11 DIAGNOSIS — E782 Mixed hyperlipidemia: Secondary | ICD-10-CM

## 2016-08-11 DIAGNOSIS — Z1159 Encounter for screening for other viral diseases: Secondary | ICD-10-CM

## 2016-08-11 DIAGNOSIS — H8110 Benign paroxysmal vertigo, unspecified ear: Secondary | ICD-10-CM

## 2016-08-11 DIAGNOSIS — Z0000 Encounter for general adult medical examination without abnormal findings: Secondary | ICD-10-CM

## 2016-08-11 DIAGNOSIS — J4520 Mild intermittent asthma, uncomplicated: Secondary | ICD-10-CM | POA: Diagnosis not present

## 2016-08-11 DIAGNOSIS — J45909 Unspecified asthma, uncomplicated: Secondary | ICD-10-CM | POA: Insufficient documentation

## 2016-08-11 DIAGNOSIS — M8588 Other specified disorders of bone density and structure, other site: Secondary | ICD-10-CM

## 2016-08-11 DIAGNOSIS — H8309 Labyrinthitis, unspecified ear: Secondary | ICD-10-CM

## 2016-08-11 MED ORDER — ALBUTEROL SULFATE HFA 108 (90 BASE) MCG/ACT IN AERS: 2.0000 | INHALATION_SPRAY | Freq: Four times a day (QID) | RESPIRATORY_TRACT | 11 refills | Status: DC | PRN

## 2016-08-11 MED ORDER — MECLIZINE HCL 25 MG PO TABS: 25.0000 mg | ORAL_TABLET | Freq: Three times a day (TID) | ORAL | 11 refills | Status: DC | PRN

## 2016-08-11 NOTE — Patient Instructions (Signed)
Fat and Cholesterol Restricted Diet Getting too much fat and cholesterol in your diet may cause health problems. Following this diet helps keep your fat and cholesterol at normal levels. This can keep you from getting sick. What types of fat should I choose?  Choose monosaturated and polyunsaturated fats. These are found in foods such as olive oil, canola oil, flaxseeds, walnuts, almonds, and seeds.  Eat more omega-3 fats. Good choices include salmon, mackerel, sardines, tuna, flaxseed oil, and ground flaxseeds.  Limit saturated fats. These are in animal products such as meats, butter, and cream. They can also be in plant products such as palm oil, palm kernel oil, and coconut oil.  Avoid foods with partially hydrogenated oils in them. These contain trans fats. Examples of foods that have trans fats are stick margarine, some tub margarines, cookies, crackers, and other baked goods. What general guidelines do I need to follow?  Check food labels. Look for the words "trans fat" and "saturated fat."  When preparing a meal: ? Fill half of your plate with vegetables and green salads. ? Fill one fourth of your plate with whole grains. Look for the word "whole" as the first word in the ingredient list. ? Fill one fourth of your plate with lean protein foods.  Eat more foods that have fiber, like apples, carrots, beans, peas, and barley.  Eat more home-cooked foods. Eat less at restaurants and buffets.  Limit or avoid alcohol.  Limit foods high in starch and sugar.  Limit fried foods.  Cook foods without frying them. Baking, boiling, grilling, and broiling are all great options.  Lose weight if you are overweight. Losing even a small amount of weight can help your overall health. It can also help prevent diseases such as diabetes and heart disease. What foods can I eat? Grains Whole grains, such as whole wheat or whole grain breads, crackers, cereals, and pasta. Unsweetened oatmeal,  bulgur, barley, quinoa, or brown rice. Corn or whole wheat flour tortillas. Vegetables Fresh or frozen vegetables (raw, steamed, roasted, or grilled). Green salads. Fruits All fresh, canned (in natural juice), or frozen fruits. Meat and Other Protein Products Ground beef (85% or leaner), grass-fed beef, or beef trimmed of fat. Skinless chicken or turkey. Ground chicken or turkey. Pork trimmed of fat. All fish and seafood. Eggs. Dried beans, peas, or lentils. Unsalted nuts or seeds. Unsalted canned or dry beans. Dairy Low-fat dairy products, such as skim or 1% milk, 2% or reduced-fat cheeses, low-fat ricotta or cottage cheese, or plain low-fat yogurt. Fats and Oils Tub margarines without trans fats. Light or reduced-fat mayonnaise and salad dressings. Avocado. Olive, canola, sesame, or safflower oils. Natural peanut or almond butter (choose ones without added sugar and oil). The items listed above may not be a complete list of recommended foods or beverages. Contact your dietitian for more options. What foods are not recommended? Grains White bread. White pasta. White rice. Cornbread. Bagels, pastries, and croissants. Crackers that contain trans fat. Vegetables White potatoes. Corn. Creamed or fried vegetables. Vegetables in a cheese sauce. Fruits Dried fruits. Canned fruit in light or heavy syrup. Fruit juice. Meat and Other Protein Products Fatty cuts of meat. Ribs, chicken wings, bacon, sausage, bologna, salami, chitterlings, fatback, hot dogs, bratwurst, and packaged luncheon meats. Liver and organ meats. Dairy Whole or 2% milk, cream, half-and-half, and cream cheese. Whole milk cheeses. Whole-fat or sweetened yogurt. Full-fat cheeses. Nondairy creamers and whipped toppings. Processed cheese, cheese spreads, or cheese curds. Sweets and Desserts Corn   syrup, sugars, honey, and molasses. Candy. Jam and jelly. Syrup. Sweetened cereals. Cookies, pies, cakes, donuts, muffins, and ice  cream. Fats and Oils Butter, stick margarine, lard, shortening, ghee, or bacon fat. Coconut, palm kernel, or palm oils. Beverages Alcohol. Sweetened drinks (such as sodas, lemonade, and fruit drinks or punches). The items listed above may not be a complete list of foods and beverages to avoid. Contact your dietitian for more information. This information is not intended to replace advice given to you by your health care provider. Make sure you discuss any questions you have with your health care provider. Document Released: 07/22/2011 Document Revised: 09/27/2015 Document Reviewed: 04/21/2013 Elsevier Interactive Patient Education  2018 Else[employer] Inc[employer]  

## 2016-08-11 NOTE — Progress Notes (Signed)
Name: Kiara Becker   MRN: [id number]    DOB: 12-24-1945   Date:08/11/2016       Progress Note  Subjective  Chief Complaint  Chief Complaint  Patient presents with  . Annual Exam    labs needed as well as bone density    Patient presents for annual physical exam.     Dizziness  This is a recurrent problem. The current episode started in the past 7 days. The problem has been waxing and waning. Associated symptoms include coughing and vertigo. Pertinent negatives include no abdominal pain, chest pain, chills, fever, headaches, myalgias, nausea, neck pain, rash or sore throat. The symptoms are aggravated by twisting. Treatments tried: meclizine. The treatment provided mild relief.  Asthma  She complains of cough. There is no hemoptysis, shortness of breath, sputum production or wheezing. This is a recurrent problem. The current episode started more than 1 month ago. The problem occurs intermittently. Pertinent negatives include no chest pain, dyspnea on exertion, ear pain, fever, headaches, heartburn, malaise/fatigue, myalgias, orthopnea, sore throat or weight loss. She reports moderate improvement on treatment. Her past medical history is significant for asthma.    No problem-specific Assessment & Plan notes found for this encounter.   Past Medical History:  Diagnosis Date  . Allergy   . Asthma   . Cancer ([place]) 2001   melanoma  . Colon cancer ([place]) 2009  . Hyperlipidemia   . Hypertension   . Ventral hernia     Past Surgical History:  Procedure Laterality Date  . CHOLECYSTECTOMY  11/05/2010   Dr. Pat Patrick  . COLECTOMY  10/13/2007   Laparoscopic assisting ascending colectomy  . COLON SURGERY  2009   remove a mass  . COLONOSCOPY  03/2014   normal- cleared for 5 years- Dr Vira Agar  . GALLBLADDER SURGERY    . HERNIA REPAIR    . KNEE SURGERY Bilateral    menisacal repair and then replacement  . SKIN SURGERY     removal melanoma on arm and eye  . VAGINAL HYSTERECTOMY  1984     Family History  Problem Relation Age of Onset  . Heart disease Mother   . Liver cancer Mother   . Cancer Mother   . Heart disease Father   . Heart disease Brother   . Drug abuse Maternal Grandfather     Social History   Social History  . Marital status: Married    Spouse name: N/A  . Number of children: N/A  . Years of education: N/A   Occupational History  . Not on file.   Social History Main Topics  . Smoking status: Never Smoker  . Smokeless tobacco: Never Used  . Alcohol use No  . Drug use: No  . Sexual activity: Yes    Birth control/ protection: None   Other Topics Concern  . Not on file   Social History Narrative  . No narrative on file    Allergies  Allergen Reactions  . Choline Fenofibrate Other (See Comments)    Roaring in ears  . Codeine Other (See Comments)    Unknown  . Codeine Sulfate Other (See Comments)  . Ezetimibe Other (See Comments)    Roaring in ears  . Fenofibrate Other (See Comments)    Roaring in ears  . Fenofibric Acid Other (See Comments)    Roaring in ears  . Gemfibrozil Other (See Comments)    Outpatient Medications Prior to Visit  Medication Sig Dispense Refill  . allopurinol (ZYLOPRIM)  100 MG tablet TAKE 1 TABLET(100 MG) BY MOUTH DAILY 90 tablet 1  . Calcium Carbonate-Vitamin D (CALTRATE 600+D PO) Take 1 tablet by mouth daily.    . cholecalciferol (VITAMIN D) 400 units TABS tablet Take 2,000 Units by mouth.    Kiara Becker Oil OIL by Does not apply route daily.    . Multiple Vitamins-Minerals (CENTRUM SILVER ULTRA WOMENS PO) Take 1 tablet by mouth daily.    . Orange Oil OIL Use as directed in the mouth or throat daily.    . Red Yeast Rice 600 MG CAPS Take by mouth.    . torsemide (DEMADEX) 10 MG tablet Take 10 mg by mouth as needed (twice a week as needed for ankle swelling). Dr Nehemiah Massed    . triamterene-hydrochlorothiazide (MAXZIDE-25) 37.5-25 MG tablet Take 0.5 tablets by mouth as directed. 3 times qweek- Dr Nehemiah Massed    .  albuterol (PROVENTIL HFA;VENTOLIN HFA) 108 (90 Base) MCG/ACT inhaler Inhale 2 puffs into the lungs every 6 (six) hours as needed for wheezing or shortness of breath. 1 Inhaler 0  . meclizine (ANTIVERT) 25 MG tablet Take 1 tablet (25 mg total) by mouth 3 (three) times daily as needed for dizziness. 30 tablet 0   Facility-Administered Medications Prior to Visit  Medication Dose Route Frequency Provider Last Rate Last Dose  . albuterol (PROVENTIL) (2.5 MG/3ML) 0.083% nebulizer solution 2.5 mg  2.5 mg Nebulization Once Juline Patch, MD        Review of Systems  Constitutional: Negative for chills, fever, malaise/fatigue and weight loss.  HENT: Negative for ear discharge, ear pain and sore throat.   Eyes: Negative for blurred vision.  Respiratory: Positive for cough. Negative for hemoptysis, sputum production, shortness of breath and wheezing.   Cardiovascular: Negative for chest pain, dyspnea on exertion, palpitations and leg swelling.  Gastrointestinal: Negative for abdominal pain, blood in stool, constipation, diarrhea, heartburn, melena and nausea.  Genitourinary: Negative for dysuria, frequency, hematuria and urgency.  Musculoskeletal: Negative for back pain, joint pain, myalgias and neck pain.  Skin: Negative for rash.  Neurological: Positive for dizziness and vertigo. Negative for tingling, sensory change, focal weakness and headaches.  Endo/Heme/Allergies: Negative for environmental allergies and polydipsia. Does not bruise/bleed easily.  Psychiatric/Behavioral: Negative for depression and suicidal ideas. The patient is not nervous/anxious and does not have insomnia.      Objective  Vitals:   08/11/16 1002  BP: 124/70  Pulse: 68  Weight: 181 lb (82.1 kg)  Height: 5\' 4"  (1.626 m)    Physical Exam  Constitutional: She is oriented to person, place, and time and well-developed, well-nourished, and in no distress. No distress.  HENT:  Head: Normocephalic and atraumatic.  Right  Ear: External ear normal.  Left Ear: External ear normal.  Nose: Nose normal.  Mouth/Throat: Oropharynx is clear and moist.  Eyes: Conjunctivae and EOM are normal. Pupils are equal, round, and reactive to light. Right eye exhibits no discharge. Left eye exhibits no discharge.  Neck: Normal range of motion. Neck supple. No JVD present. No thyromegaly present.  Cardiovascular: Normal rate, regular rhythm, normal heart sounds and intact distal pulses.  Exam reveals no gallop and no friction rub.   No murmur heard. Pulmonary/Chest: Effort normal and breath sounds normal. She has no wheezes. She has no rales. Right breast exhibits no inverted nipple, no mass, no nipple discharge, no skin change and no tenderness. Left breast exhibits no inverted nipple, no mass, no nipple discharge, no skin change and no  tenderness. Breasts are symmetrical.  Abdominal: Soft. Normal aorta and bowel sounds are normal. She exhibits no mass. There is no tenderness. There is no guarding.  Musculoskeletal: Normal range of motion. She exhibits no edema.  Lymphadenopathy:       Head (right side): No submental and no submandibular adenopathy present.       Head (left side): No submental adenopathy present.    She has no cervical adenopathy.    She has no axillary adenopathy.  Neurological: She is alert and oriented to person, place, and time. She has normal motor skills and normal reflexes. No cranial nerve deficit.  Reflex Scores:      Tricep reflexes are 2+ on the right side and 2+ on the left side.      Bicep reflexes are 2+ on the right side and 2+ on the left side.      Brachioradialis reflexes are 2+ on the right side and 2+ on the left side.      Patellar reflexes are 2+ on the right side and 2+ on the left side.      Achilles reflexes are 2+ on the right side and 2+ on the left side. Skin: Skin is warm and dry. She is not diaphoretic.  Psychiatric: Mood and affect normal.  Nursing note and vitals  reviewed.     Assessment & Plan  Problem List Items Addressed This Visit      Respiratory   Reactive airway disease     Nervous and Auditory   Benign paroxysmal positional vertigo     Other   Combined fat and carbohydrate induced hyperlipemia   Relevant Orders   Lipid Profile   Renal function panel    Other Visit Diagnoses    Annual physical exam    -  Primary   Relevant Orders   Hepatitis C antibody   Labyrinthitis, unspecified laterality       Relevant Medications   meclizine (ANTIVERT) 25 MG tablet   Reactive airway disease, mild intermittent, uncomplicated       Relevant Medications   albuterol (PROVENTIL HFA;VENTOLIN HFA) 108 (90 Base) MCG/ACT inhaler   Osteopenia of lumbar spine       Relevant Orders   DG Bone Density   Need for hepatitis C screening test       Relevant Orders   Hepatitis C antibody      Meds ordered this encounter  Medications  . meclizine (ANTIVERT) 25 MG tablet    Sig: Take 1 tablet (25 mg total) by mouth 3 (three) times daily as needed for dizziness.    Dispense:  30 tablet    Refill:  11  . albuterol (PROVENTIL HFA;VENTOLIN HFA) 108 (90 Base) MCG/ACT inhaler    Sig: Inhale 2 puffs into the lungs every 6 (six) hours as needed for wheezing or shortness of breath.    Dispense:  1 Inhaler    Refill:  11      Dr. Mac[facility]  08/11/16

## 2016-08-12 LAB — RENAL FUNCTION PANEL
ALBUMIN: 5 g/dL — AB (ref 3.5–4.8)
BUN/Creatinine Ratio: 25 (ref 12–28)
BUN: 23 mg/dL (ref 8–27)
CALCIUM: 10.2 mg/dL (ref 8.7–10.3)
CO2: 25 mmol/L (ref 20–29)
CREATININE: 0.91 mg/dL (ref 0.57–1.00)
Chloride: 96 mmol/L (ref 96–106)
GFR calc Af Amer: 73 mL/min/{1.73_m2} (ref >59)
GFR, EST NON AFRICAN AMERICAN: 64 mL/min/{1.73_m2} (ref >59)
Glucose: 85 mg/dL (ref 65–99)
PHOSPHORUS: 4 mg/dL (ref 2.5–4.5)
Potassium: 3.5 mmol/L (ref 3.5–5.2)
SODIUM: 141 mmol/L (ref 134–144)

## 2016-08-12 LAB — LIPID PANEL
CHOL/HDL RATIO: 4.6 ratio — AB (ref 0.0–4.4)
CHOLESTEROL TOTAL: 162 mg/dL (ref 100–199)
HDL: 35 mg/dL — AB (ref >39)
LDL Calculated: 58 mg/dL (ref 0–99)
TRIGLYCERIDES: 346 mg/dL — AB (ref 0–149)
VLDL Cholesterol Cal: 69 mg/dL — ABNORMAL HIGH (ref 5–40)

## 2016-08-12 LAB — HEPATITIS C ANTIBODY

## 2016-08-13 DIAGNOSIS — L570 Actinic keratosis: Secondary | ICD-10-CM | POA: Diagnosis not present

## 2016-08-13 DIAGNOSIS — D485 Neoplasm of uncertain behavior of skin: Secondary | ICD-10-CM | POA: Diagnosis not present

## 2016-08-13 DIAGNOSIS — C44722 Squamous cell carcinoma of skin of right lower limb, including hip: Secondary | ICD-10-CM | POA: Diagnosis not present

## 2016-08-19 ENCOUNTER — Inpatient Hospital Stay: Admission: RE | Admit: 2016-08-19 | Payer: Medicare Other | Source: Ambulatory Visit

## 2016-08-22 DIAGNOSIS — D414 Neoplasm of uncertain behavior of bladder: Secondary | ICD-10-CM | POA: Insufficient documentation

## 2016-08-22 DIAGNOSIS — N2889 Other specified disorders of kidney and ureter: Secondary | ICD-10-CM | POA: Diagnosis not present

## 2016-08-25 ENCOUNTER — Ambulatory Visit
Admission: RE | Admit: 2016-08-25 | Discharge: 2016-08-25 | Disposition: A | Discharge status: Home / Self Care | Payer: Medicare Other | Source: Ambulatory Visit | Attending: Family Medicine | Admitting: Family Medicine

## 2016-08-25 DIAGNOSIS — Z780 Asymptomatic menopausal state: Secondary | ICD-10-CM | POA: Diagnosis not present

## 2016-08-25 DIAGNOSIS — M8588 Other specified disorders of bone density and structure, other site: Secondary | ICD-10-CM

## 2016-08-25 DIAGNOSIS — Z13820 Encounter for screening for osteoporosis: Secondary | ICD-10-CM | POA: Diagnosis not present

## 2016-08-26 DIAGNOSIS — D0471 Carcinoma in situ of skin of right lower limb, including hip: Secondary | ICD-10-CM | POA: Diagnosis not present

## 2016-08-26 DIAGNOSIS — C44722 Squamous cell carcinoma of skin of right lower limb, including hip: Secondary | ICD-10-CM | POA: Diagnosis not present

## 2016-09-02 DIAGNOSIS — I872 Venous insufficiency (chronic) (peripheral): Secondary | ICD-10-CM | POA: Diagnosis not present

## 2016-09-03 DIAGNOSIS — D414 Neoplasm of uncertain behavior of bladder: Secondary | ICD-10-CM | POA: Diagnosis not present

## 2016-09-05 DIAGNOSIS — E782 Mixed hyperlipidemia: Secondary | ICD-10-CM | POA: Diagnosis not present

## 2016-09-15 DIAGNOSIS — B999 Unspecified infectious disease: Secondary | ICD-10-CM | POA: Diagnosis not present

## 2016-09-26 DIAGNOSIS — I10 Essential (primary) hypertension: Secondary | ICD-10-CM | POA: Diagnosis not present

## 2016-09-26 DIAGNOSIS — I493 Ventricular premature depolarization: Secondary | ICD-10-CM | POA: Diagnosis not present

## 2016-09-26 DIAGNOSIS — I700 Atherosclerosis of aorta: Secondary | ICD-10-CM | POA: Insufficient documentation

## 2016-09-26 DIAGNOSIS — E782 Mixed hyperlipidemia: Secondary | ICD-10-CM | POA: Diagnosis not present

## 2016-10-14 ENCOUNTER — Other Ambulatory Visit: Payer: Self-pay | Admitting: Family Medicine

## 2016-11-03 ENCOUNTER — Ambulatory Visit (INDEPENDENT_AMBULATORY_CARE_PROVIDER_SITE_OTHER): Payer: Medicare Other | Admitting: Family Medicine

## 2016-11-03 ENCOUNTER — Encounter: Payer: Self-pay | Admitting: Family Medicine

## 2016-11-03 VITALS — BP 132/60 | HR 88 | Ht 64.0 in | Wt 179.0 lb

## 2016-11-03 DIAGNOSIS — J4521 Mild intermittent asthma with (acute) exacerbation: Secondary | ICD-10-CM

## 2016-11-03 DIAGNOSIS — J219 Acute bronchiolitis, unspecified: Secondary | ICD-10-CM

## 2016-11-03 DIAGNOSIS — J0100 Acute maxillary sinusitis, unspecified: Secondary | ICD-10-CM

## 2016-11-03 MED ORDER — AZITHROMYCIN 250 MG PO TABS: ORAL_TABLET | ORAL | 1 refills | Status: DC

## 2016-11-03 NOTE — Progress Notes (Signed)
Name: Kiara Becker   MRN: [id number]    DOB: February 26, 1945   Date:11/03/2016       Progress Note  Subjective  Chief Complaint  Chief Complaint  Patient presents with  . Sinusitis    cough and cong/ "fall crud"    Sinusitis  This is a recurrent problem. The current episode started in the past 7 days. The problem has been gradually worsening since onset. There has been no fever. Her pain is at a severity of 5/10. The pain is mild. Associated symptoms include congestion, coughing, shortness of breath, sinus pressure, sneezing and a sore throat. Pertinent negatives include no chills, diaphoresis, ear pain, headaches, hoarse voice, neck pain or swollen glands. Treatments tried: benedryl. The treatment provided moderate relief.  Cough  This is a new problem. The current episode started in the past 7 days. The problem has been gradually worsening. The cough is non-productive. Associated symptoms include a sore throat and shortness of breath. Pertinent negatives include no chest pain, chills, ear congestion, ear pain, fever, headaches, heartburn, hemoptysis, myalgias, nasal congestion, postnasal drip, rash, rhinorrhea, sweats, weight loss or wheezing. The symptoms are aggravated by pollens (mold). She has tried a beta-agonist inhaler for the symptoms. The treatment provided mild relief. Her past medical history is significant for asthma and bronchitis. There is no history of bronchiectasis, COPD, emphysema, environmental allergies or pneumonia.    No problem-specific Assessment & Plan notes found for this encounter.   Past Medical History:  Diagnosis Date  . Allergy   . Asthma   . Cancer ([place]) 2001   melanoma  . Colon cancer ([place]) 2009  . Hyperlipidemia   . Hypertension   . Ventral hernia     Past Surgical History:  Procedure Laterality Date  . CHOLECYSTECTOMY  11/05/2010   Dr. Pat Patrick  . COLECTOMY  10/13/2007   Laparoscopic assisting ascending colectomy  . COLON SURGERY  2009   remove a mass   . COLONOSCOPY  03/2014   normal- cleared for 5 years- Dr Vira Agar  . GALLBLADDER SURGERY    . HERNIA REPAIR    . KNEE SURGERY Bilateral    menisacal repair and then replacement  . SKIN SURGERY     removal melanoma on arm and eye  . VAGINAL HYSTERECTOMY  1984    Family History  Problem Relation Age of Onset  . Heart disease Mother   . Liver cancer Mother   . Cancer Mother   . Heart disease Father   . Heart disease Brother   . Drug abuse Maternal Grandfather     Social History   Social History  . Marital status: Married    Spouse name: N/A  . Number of children: N/A  . Years of education: N/A   Occupational History  . Not on file.   Social History Main Topics  . Smoking status: Never Smoker  . Smokeless tobacco: Never Used  . Alcohol use No  . Drug use: No  . Sexual activity: Yes    Birth control/ protection: None   Other Topics Concern  . Not on file   Social History Narrative  . No narrative on file    Allergies  Allergen Reactions  . Choline Fenofibrate Other (See Comments)    Roaring in ears  . Codeine Other (See Comments)    Unknown  . Codeine Sulfate Other (See Comments)  . Ezetimibe Other (See Comments)    Roaring in ears  . Fenofibrate Other (See Comments)  Roaring in ears  . Fenofibric Acid Other (See Comments)    Roaring in ears  . Gemfibrozil Other (See Comments)    Outpatient Medications Prior to Visit  Medication Sig Dispense Refill  . albuterol (PROVENTIL HFA;VENTOLIN HFA) 108 (90 Base) MCG/ACT inhaler Inhale 2 puffs into the lungs every 6 (six) hours as needed for wheezing or shortness of breath. 1 Inhaler 11  . allopurinol (ZYLOPRIM) 100 MG tablet TAKE 1 TABLET(100 MG) BY MOUTH DAILY 90 tablet 1  . Calcium Carbonate-Vitamin D (CALTRATE 600+D PO) Take 1 tablet by mouth daily.    . cholecalciferol (VITAMIN D) 400 units TABS tablet Take 2,000 Units by mouth.    . meclizine (ANTIVERT) 25 MG tablet Take 1 tablet (25 mg total) by mouth 3  (three) times daily as needed for dizziness. 30 tablet 11  . Multiple Vitamins-Minerals (CENTRUM SILVER ULTRA WOMENS PO) Take 1 tablet by mouth daily.    . Red Yeast Rice 600 MG CAPS Take by mouth.    . torsemide (DEMADEX) 10 MG tablet Take 10 mg by mouth as needed (twice a week as needed for ankle swelling). Dr Nehemiah Massed    . triamterene-hydrochlorothiazide (MAXZIDE-25) 37.5-25 MG tablet Take 0.5 tablets by mouth as directed. 3 times qweek- Dr Nehemiah Massed    . allopurinol (ZYLOPRIM) 100 MG tablet TAKE 1 TABLET(100 MG) BY MOUTH DAILY 90 tablet 0  . Lemon Oil OIL by Does not apply route daily.    . Orange Oil OIL Use as directed in the mouth or throat daily.     Facility-Administered Medications Prior to Visit  Medication Dose Route Frequency Provider Last Rate Last Dose  . albuterol (PROVENTIL) (2.5 MG/3ML) 0.083% nebulizer solution 2.5 mg  2.5 mg Nebulization Once Juline Patch, MD        Review of Systems  Constitutional: Negative for chills, diaphoresis, fever, malaise/fatigue and weight loss.  HENT: Positive for congestion, sinus pressure, sneezing and sore throat. Negative for ear discharge, ear pain, hoarse voice, postnasal drip and rhinorrhea.   Eyes: Negative for blurred vision.  Respiratory: Positive for cough and shortness of breath. Negative for hemoptysis, sputum production and wheezing.   Cardiovascular: Negative for chest pain, palpitations and leg swelling.  Gastrointestinal: Negative for abdominal pain, blood in stool, constipation, diarrhea, heartburn, melena and nausea.  Genitourinary: Negative for dysuria, frequency, hematuria and urgency.  Musculoskeletal: Negative for back pain, joint pain, myalgias and neck pain.  Skin: Negative for rash.  Neurological: Negative for dizziness, tingling, sensory change, focal weakness and headaches.  Endo/Heme/Allergies: Negative for environmental allergies and polydipsia. Does not bruise/bleed easily.  Psychiatric/Behavioral: Negative  for depression and suicidal ideas. The patient is not nervous/anxious and does not have insomnia.      Objective  Vitals:   11/03/16 0935  BP: 132/60  Pulse: 88  Weight: 179 lb (81.2 kg)  Height: 5\' 4"  (1.626 m)    Physical Exam  Constitutional: She is well-developed, well-nourished, and in no distress. No distress.  HENT:  Head: Normocephalic and atraumatic.  Right Ear: External ear normal.  Left Ear: External ear normal.  Nose: Nose normal.  Mouth/Throat: Oropharynx is clear and moist.  Eyes: Pupils are equal, round, and reactive to light. Conjunctivae and EOM are normal. Right eye exhibits no discharge. Left eye exhibits no discharge.  Neck: Normal range of motion. Neck supple. No JVD present. No thyromegaly present.  Cardiovascular: Normal rate, regular rhythm, normal heart sounds and intact distal pulses.  Exam reveals no gallop and  no friction rub.   No murmur heard. Pulmonary/Chest: Effort normal. She has wheezes. She has no rales.  Abdominal: Soft. Bowel sounds are normal. She exhibits no mass. There is no tenderness. There is no guarding.  Musculoskeletal: Normal range of motion. She exhibits no edema.  Lymphadenopathy:    She has no cervical adenopathy.  Neurological: She is alert. She has normal reflexes.  Skin: Skin is warm and dry. She is not diaphoretic.  Psychiatric: Mood and affect normal.      Assessment & Plan  Problem List Items Addressed This Visit      Respiratory   Bronchiolitis - Primary   Relevant Medications   azithromycin (ZITHROMAX) 250 MG tablet    Other Visit Diagnoses    Mild intermittent asthma with acute exacerbation       sample symbicort/albuterol   Acute maxillary sinusitis, recurrence not specified       Relevant Medications   azithromycin (ZITHROMAX) 250 MG tablet      Meds ordered this encounter  Medications  . azithromycin (ZITHROMAX) 250 MG tablet    Sig: 2 today then 1 a day for 4 days    Dispense:  6 tablet     Refill:  1      Dr. [facility]  11/03/16

## 2016-12-24 DIAGNOSIS — H43812 Vitreous degeneration, left eye: Secondary | ICD-10-CM | POA: Diagnosis not present

## 2016-12-24 DIAGNOSIS — H2513 Age-related nuclear cataract, bilateral: Secondary | ICD-10-CM | POA: Diagnosis not present

## 2017-03-20 DIAGNOSIS — J45909 Unspecified asthma, uncomplicated: Secondary | ICD-10-CM | POA: Diagnosis not present

## 2017-03-20 DIAGNOSIS — Z7951 Long term (current) use of inhaled steroids: Secondary | ICD-10-CM | POA: Diagnosis not present

## 2017-03-20 DIAGNOSIS — Z720 Tobacco use: Secondary | ICD-10-CM | POA: Diagnosis not present

## 2017-03-20 DIAGNOSIS — N2889 Other specified disorders of kidney and ureter: Secondary | ICD-10-CM | POA: Diagnosis not present

## 2017-03-20 DIAGNOSIS — R310 Gross hematuria: Secondary | ICD-10-CM | POA: Diagnosis not present

## 2017-03-20 DIAGNOSIS — Z85038 Personal history of other malignant neoplasm of large intestine: Secondary | ICD-10-CM | POA: Diagnosis not present

## 2017-03-20 DIAGNOSIS — Z9049 Acquired absence of other specified parts of digestive tract: Secondary | ICD-10-CM | POA: Diagnosis not present

## 2017-03-20 DIAGNOSIS — Z79899 Other long term (current) drug therapy: Secondary | ICD-10-CM | POA: Diagnosis not present

## 2017-03-20 DIAGNOSIS — I10 Essential (primary) hypertension: Secondary | ICD-10-CM | POA: Diagnosis not present

## 2017-03-20 DIAGNOSIS — E785 Hyperlipidemia, unspecified: Secondary | ICD-10-CM | POA: Diagnosis not present

## 2017-03-20 DIAGNOSIS — Z885 Allergy status to narcotic agent status: Secondary | ICD-10-CM | POA: Diagnosis not present

## 2017-03-26 DIAGNOSIS — M65319 Trigger thumb, unspecified thumb: Secondary | ICD-10-CM | POA: Diagnosis not present

## 2017-03-26 DIAGNOSIS — M65332 Trigger finger, left middle finger: Secondary | ICD-10-CM | POA: Diagnosis not present

## 2017-03-26 DIAGNOSIS — M25542 Pain in joints of left hand: Secondary | ICD-10-CM | POA: Diagnosis not present

## 2017-04-21 ENCOUNTER — Other Ambulatory Visit: Payer: Self-pay | Admitting: Family Medicine

## 2017-04-21 ENCOUNTER — Encounter: Payer: Self-pay | Admitting: Family Medicine

## 2017-04-21 ENCOUNTER — Ambulatory Visit
Admission: RE | Admit: 2017-04-21 | Discharge: 2017-04-21 | Disposition: A | Discharge status: Home / Self Care | Payer: Medicare Other | Source: Ambulatory Visit | Attending: Family Medicine | Admitting: Family Medicine

## 2017-04-21 ENCOUNTER — Ambulatory Visit: Payer: Medicare Other | Admitting: Family Medicine

## 2017-04-21 VITALS — BP 138/78 | HR 80 | Ht 64.0 in | Wt 183.0 lb

## 2017-04-21 DIAGNOSIS — M4186 Other forms of scoliosis, lumbar region: Secondary | ICD-10-CM | POA: Diagnosis not present

## 2017-04-21 DIAGNOSIS — M519 Unspecified thoracic, thoracolumbar and lumbosacral intervertebral disc disorder: Secondary | ICD-10-CM | POA: Diagnosis not present

## 2017-04-21 DIAGNOSIS — M47814 Spondylosis without myelopathy or radiculopathy, thoracic region: Secondary | ICD-10-CM | POA: Diagnosis not present

## 2017-04-21 MED ORDER — MELOXICAM 15 MG PO TABS: 15.0000 mg | ORAL_TABLET | Freq: Every day | ORAL | 0 refills | Status: DC

## 2017-04-21 MED ORDER — CYCLOBENZAPRINE HCL 10 MG PO TABS
10.0000 mg | ORAL_TABLET | Freq: Every day | ORAL | 0 refills | Status: DC
Start: 2017-04-21 — End: 2017-08-25

## 2017-04-21 NOTE — Progress Notes (Signed)
Name: Kiara Becker   MRN: [id number]    DOB: 1945/08/28   Date:04/21/2017       Progress Note  Subjective  Chief Complaint  Chief Complaint  Patient presents with  . Back Pain    "catches" in lower left side of back and radiates around the side- hasn't done anything out of the ordinary    Back Pain  This is a new problem. The current episode started 1 to 4 weeks ago (2 weeks). The problem occurs constantly. The problem has been gradually improving since onset. The pain is present in the thoracic spine. The quality of the pain is described as aching. The pain does not radiate. The pain is at a severity of 4/10. The pain is moderate. The symptoms are aggravated by twisting. Pertinent negatives include no abdominal pain, bladder incontinence, bowel incontinence, chest pain, dysuria, fever, headaches, leg pain, numbness, paresis, paresthesias, pelvic pain, perianal numbness, tingling, weakness or weight loss. She has tried NSAIDs (oatmeal) for the symptoms. The treatment provided mild relief.    No problem-specific Assessment & Plan notes found for this encounter.   Past Medical History:  Diagnosis Date  . Allergy   . Asthma   . Cancer ([place]) 2001   melanoma  . Colon cancer ([place]) 2009  . Hyperlipidemia   . Hypertension   . Ventral hernia     Past Surgical History:  Procedure Laterality Date  . CHOLECYSTECTOMY  11/05/2010   Dr. Pat Patrick  . COLECTOMY  10/13/2007   Laparoscopic assisting ascending colectomy  . COLON SURGERY  2009   remove a mass  . COLONOSCOPY  03/2014   normal- cleared for 5 years- Dr Vira Agar  . GALLBLADDER SURGERY    . HERNIA REPAIR    . KNEE SURGERY Bilateral    menisacal repair and then replacement  . SKIN SURGERY     removal melanoma on arm and eye  . VAGINAL HYSTERECTOMY  1984    Family History  Problem Relation Age of Onset  . Heart disease Mother   . Liver cancer Mother   . Cancer Mother   . Heart disease Father   . Heart disease Brother   . Drug  abuse Maternal Grandfather     Social History   Socioeconomic History  . Marital status: Married    Spouse name: Not on file  . Number of children: Not on file  . Years of education: Not on file  . Highest education level: Not on file  Social Needs  . Financial resource strain: Not on file  . Food insecurity - worry: Not on file  . Food insecurity - inability: Not on file  . Transportation needs - medical: Not on file  . Transportation needs - non-medical: Not on file  Occupational History  . Not on file  Tobacco Use  . Smoking status: Never Smoker  . Smokeless tobacco: Never Used  Substance and Sexual Activity  . Alcohol use: No    Alcohol/week: 0.0 oz  . Drug use: No  . Sexual activity: Yes    Birth control/protection: None  Other Topics Concern  . Not on file  Social History Narrative  . Not on file    Allergies  Allergen Reactions  . Choline Fenofibrate Other (See Comments)    Roaring in ears  . Codeine Other (See Comments)    Unknown  . Codeine Sulfate Other (See Comments)  . Ezetimibe Other (See Comments)    Roaring in ears  . Fenofibrate  Other (See Comments)    Roaring in ears  . Fenofibric Acid Other (See Comments)    Roaring in ears  . Gemfibrozil Other (See Comments)    Outpatient Medications Prior to Visit  Medication Sig Dispense Refill  . albuterol (PROVENTIL HFA;VENTOLIN HFA) 108 (90 Base) MCG/ACT inhaler Inhale 2 puffs into the lungs every 6 (six) hours as needed for wheezing or shortness of breath. 1 Inhaler 11  . allopurinol (ZYLOPRIM) 100 MG tablet TAKE 1 TABLET(100 MG) BY MOUTH DAILY 90 tablet 1  . Calcium Carbonate-Vitamin D (CALTRATE 600+D PO) Take 1 tablet by mouth daily.    . cholecalciferol (VITAMIN D) 400 units TABS tablet Take 2,000 Units by mouth.    . meclizine (ANTIVERT) 25 MG tablet Take 1 tablet (25 mg total) by mouth 3 (three) times daily as needed for dizziness. 30 tablet 11  . Multiple Vitamins-Minerals (CENTRUM SILVER ULTRA  WOMENS PO) Take 1 tablet by mouth daily.    . Red Yeast Rice 600 MG CAPS Take by mouth.    . torsemide (DEMADEX) 10 MG tablet Take 10 mg by mouth as needed (twice a week as needed for ankle swelling). Dr Nehemiah Massed    . triamterene-hydrochlorothiazide (MAXZIDE-25) 37.5-25 MG tablet Take 0.5 tablets by mouth as directed. 3 times qweek- Dr Nehemiah Massed    . azithromycin (ZITHROMAX) 250 MG tablet 2 today then 1 a day for 4 days 6 tablet 1   Facility-Administered Medications Prior to Visit  Medication Dose Route Frequency Provider Last Rate Last Dose  . albuterol (PROVENTIL) (2.5 MG/3ML) 0.083% nebulizer solution 2.5 mg  2.5 mg Nebulization Once Juline Patch, MD        Review of Systems  Constitutional: Negative for chills, fever, malaise/fatigue and weight loss.  HENT: Negative for ear discharge, ear pain and sore throat.   Eyes: Negative for blurred vision.  Respiratory: Negative for cough, sputum production, shortness of breath and wheezing.   Cardiovascular: Negative for chest pain, palpitations and leg swelling.  Gastrointestinal: Negative for abdominal pain, blood in stool, bowel incontinence, constipation, diarrhea, heartburn, melena and nausea.  Genitourinary: Negative for bladder incontinence, dysuria, frequency, hematuria, pelvic pain and urgency.  Musculoskeletal: Positive for back pain. Negative for joint pain, myalgias and neck pain.  Skin: Negative for rash.  Neurological: Negative for dizziness, tingling, sensory change, focal weakness, weakness, numbness, headaches and paresthesias.  Endo/Heme/Allergies: Negative for environmental allergies and polydipsia. Does not bruise/bleed easily.  Psychiatric/Behavioral: Negative for depression and suicidal ideas. The patient is not nervous/anxious and does not have insomnia.      Objective  Vitals:   04/21/17 0812  BP: 138/78  Pulse: 80  Weight: 183 lb (83 kg)  Height: 5\' 4"  (1.626 m)    Physical Exam  Constitutional: She is  well-developed, well-nourished, and in no distress. No distress.  HENT:  Head: Normocephalic and atraumatic.  Right Ear: External ear normal.  Left Ear: External ear normal.  Nose: Nose normal.  Mouth/Throat: Oropharynx is clear and moist.  Eyes: Conjunctivae and EOM are normal. Pupils are equal, round, and reactive to light. Right eye exhibits no discharge. Left eye exhibits no discharge.  Neck: Normal range of motion. Neck supple. No JVD present. No thyromegaly present.  Cardiovascular: Normal rate, regular rhythm, normal heart sounds and intact distal pulses. Exam reveals no gallop and no friction rub.  No murmur heard. Pulmonary/Chest: Effort normal. She has no wheezes. She has no rales.  Abdominal: Soft. Bowel sounds are normal. She exhibits no  mass. There is no tenderness. There is no guarding.  Musculoskeletal: Normal range of motion. She exhibits no edema.       Thoracic back: She exhibits tenderness, bony tenderness and pain. She exhibits no deformity.       Back:  Lymphadenopathy:    She has no cervical adenopathy.  Neurological: She is alert. She has normal sensation, normal strength and normal reflexes.  Skin: Skin is warm and dry. She is not diaphoretic.  Psychiatric: Mood and affect normal.  Nursing note and vitals reviewed.     Assessment & Plan  Problem List Items Addressed This Visit    None    Visit Diagnoses    Thoracic disc disease    -  Primary   Relevant Medications   meloxicam (MOBIC) 15 MG tablet   cyclobenzaprine (FLEXERIL) 10 MG tablet   Other Relevant Orders   DG Thoracic Spine W/Swimmers (Completed)      Meds ordered this encounter  Medications  . meloxicam (MOBIC) 15 MG tablet    Sig: Take 1 tablet (15 mg total) by mouth daily.    Dispense:  30 tablet    Refill:  0  . cyclobenzaprine (FLEXERIL) 10 MG tablet    Sig: Take 1 tablet (10 mg total) by mouth at bedtime.    Dispense:  30 tablet    Refill:  0      Dr. Briggett Tuccillo [facility]  04/21/17

## 2017-05-05 ENCOUNTER — Ambulatory Visit: Payer: Medicare Other | Admitting: Family Medicine

## 2017-05-05 ENCOUNTER — Encounter: Payer: Self-pay | Admitting: Family Medicine

## 2017-05-05 DIAGNOSIS — M519 Unspecified thoracic, thoracolumbar and lumbosacral intervertebral disc disorder: Secondary | ICD-10-CM | POA: Diagnosis not present

## 2017-05-05 MED ORDER — MELOXICAM 15 MG PO TABS: ORAL_TABLET | ORAL | 0 refills | Status: DC

## 2017-05-05 NOTE — Progress Notes (Signed)
Name: Kiara Becker   MRN: [id number]    DOB: [birth year]   Date:05/05/2017       Progress Note  Subjective  Chief Complaint  Chief Complaint  Patient presents with  . Follow-up    back pain- meloxicam helps- wants a prescription to continue    Back Pain  This is a new problem. The current episode started more than 1 month ago (6 wks). The problem occurs daily. The problem has been gradually improving since onset. The pain is present in the thoracic spine. The quality of the pain is described as aching. The pain does not radiate. The pain is at a severity of 8/10. The pain is moderate. The symptoms are aggravated by bending. Pertinent negatives include no abdominal pain, bladder incontinence, bowel incontinence, chest pain, dysuria, fever, headaches, leg pain, numbness, paresis, paresthesias, pelvic pain, perianal numbness, tingling, weakness or weight loss. Risk factors include poor posture. She has tried NSAIDs for the symptoms. The treatment provided mild relief.    No problem-specific Assessment & Plan notes found for this encounter.   Past Medical History:  Diagnosis Date  . Allergy   . Asthma   . Cancer ([place]) 2001   melanoma  . Colon cancer ([place]) 2009  . Hyperlipidemia   . Hypertension   . Ventral hernia     Past Surgical History:  Procedure Laterality Date  . CHOLECYSTECTOMY  11/05/2010   Dr. Pat Patrick  . COLECTOMY  10/13/2007   Laparoscopic assisting ascending colectomy  . COLON SURGERY  2009   remove a mass  . COLONOSCOPY  03/2014   normal- cleared for 5 years- Dr Vira Agar  . GALLBLADDER SURGERY    . HERNIA REPAIR    . KNEE SURGERY Bilateral    menisacal repair and then replacement  . SKIN SURGERY     removal melanoma on arm and eye  . VAGINAL HYSTERECTOMY  1984    Family History  Problem Relation Age of Onset  . Heart disease Mother   . Liver cancer Mother   . Cancer Mother   . Heart disease Father   . Heart disease Brother   . Drug abuse Maternal Grandfather      Social History   Socioeconomic History  . Marital status: Married    Spouse name: Not on file  . Number of children: Not on file  . Years of education: Not on file  . Highest education level: Not on file  Occupational History  . Not on file  Social Needs  . Financial resource strain: Not on file  . Food insecurity:    Worry: Not on file    Inability: Not on file  . Transportation needs:    Medical: Not on file    Non-medical: Not on file  Tobacco Use  . Smoking status: Never Smoker  . Smokeless tobacco: Never Used  Substance and Sexual Activity  . Alcohol use: No    Alcohol/week: 0.0 oz  . Drug use: No  . Sexual activity: Yes    Birth control/protection: None  Lifestyle  . Physical activity:    Days per week: Not on file    Minutes per session: Not on file  . Stress: Not on file  Relationships  . Social connections:    Talks on phone: Not on file    Gets together: Not on file    Attends religious service: Not on file    Active member of club or organization: Not on file  Attends meetings of clubs or organizations: Not on file    Relationship status: Not on file  . Intimate partner violence:    Fear of current or ex partner: Not on file    Emotionally abused: Not on file    Physically abused: Not on file    Forced sexual activity: Not on file  Other Topics Concern  . Not on file  Social History Narrative  . Not on file    Allergies  Allergen Reactions  . Choline Fenofibrate Other (See Comments)    Roaring in ears  . Codeine Other (See Comments)    Unknown  . Codeine Sulfate Other (See Comments)  . Ezetimibe Other (See Comments)    Roaring in ears  . Fenofibrate Other (See Comments)    Roaring in ears  . Fenofibric Acid Other (See Comments)    Roaring in ears  . Gemfibrozil Other (See Comments)    Outpatient Medications Prior to Visit  Medication Sig Dispense Refill  . albuterol (PROVENTIL HFA;VENTOLIN HFA) 108 (90 Base) MCG/ACT inhaler  Inhale 2 puffs into the lungs every 6 (six) hours as needed for wheezing or shortness of breath. 1 Inhaler 11  . allopurinol (ZYLOPRIM) 100 MG tablet TAKE 1 TABLET(100 MG) BY MOUTH DAILY 90 tablet 1  . Calcium Carbonate-Vitamin D (CALTRATE 600+D PO) Take 1 tablet by mouth daily.    . cholecalciferol (VITAMIN D) 400 units TABS tablet Take 2,000 Units by mouth.    . cyclobenzaprine (FLEXERIL) 10 MG tablet Take 1 tablet (10 mg total) by mouth at bedtime. 30 tablet 0  . meclizine (ANTIVERT) 25 MG tablet Take 1 tablet (25 mg total) by mouth 3 (three) times daily as needed for dizziness. 30 tablet 11  . Multiple Vitamins-Minerals (CENTRUM SILVER ULTRA WOMENS PO) Take 1 tablet by mouth daily.    . Red Yeast Rice 600 MG CAPS Take by mouth.    . torsemide (DEMADEX) 10 MG tablet Take 10 mg by mouth as needed (twice a week as needed for ankle swelling). Dr Nehemiah Massed    . triamterene-hydrochlorothiazide (MAXZIDE-25) 37.5-25 MG tablet Take 0.5 tablets by mouth as directed. 3 times qweek- Dr Nehemiah Massed    . meloxicam (MOBIC) 15 MG tablet TAKE 1 TABLET(15 MG) BY MOUTH DAILY 90 tablet 0   Facility-Administered Medications Prior to Visit  Medication Dose Route Frequency Provider Last Rate Last Dose  . albuterol (PROVENTIL) (2.5 MG/3ML) 0.083% nebulizer solution 2.5 mg  2.5 mg Nebulization Once Juline Patch, MD        Review of Systems  Constitutional: Negative for chills, fever, malaise/fatigue and weight loss.  HENT: Negative for ear discharge, ear pain and sore throat.   Eyes: Negative for blurred vision.  Respiratory: Negative for cough, sputum production, shortness of breath and wheezing.   Cardiovascular: Negative for chest pain, palpitations and leg swelling.  Gastrointestinal: Negative for abdominal pain, blood in stool, bowel incontinence, constipation, diarrhea, heartburn, melena and nausea.  Genitourinary: Negative for bladder incontinence, dysuria, frequency, hematuria, pelvic pain and urgency.   Musculoskeletal: Positive for back pain. Negative for joint pain, myalgias and neck pain.  Skin: Negative for rash.  Neurological: Negative for dizziness, tingling, sensory change, focal weakness, weakness, numbness, headaches and paresthesias.  Endo/Heme/Allergies: Negative for environmental allergies and polydipsia. Does not bruise/bleed easily.  Psychiatric/Behavioral: Negative for depression and suicidal ideas. The patient is not nervous/anxious and does not have insomnia.      Objective  Vitals:   05/05/17 1334  BP: 120/78  Pulse: 70  Weight: 185 lb (83.9 kg)  Height: 5\' 4"  (1.626 m)    Physical Exam  Constitutional: She is well-developed, well-nourished, and in no distress. No distress.  HENT:  Head: Normocephalic and atraumatic.  Right Ear: External ear normal.  Left Ear: External ear normal.  Nose: Nose normal.  Mouth/Throat: Oropharynx is clear and moist.  Eyes: Pupils are equal, round, and reactive to light. Conjunctivae and EOM are normal. Right eye exhibits no discharge. Left eye exhibits no discharge.  Neck: Normal range of motion. Neck supple. No JVD present. No thyromegaly present.  Cardiovascular: Normal rate, regular rhythm, normal heart sounds and intact distal pulses. Exam reveals no gallop and no friction rub.  No murmur heard. Pulmonary/Chest: Effort normal and breath sounds normal. She has no wheezes. She has no rales.  Abdominal: Soft. Bowel sounds are normal. She exhibits no mass. There is no tenderness. There is no guarding.  Musculoskeletal: Normal range of motion. She exhibits no edema.       Thoracic back: She exhibits spasm.  Lymphadenopathy:    She has no cervical adenopathy.  Neurological: She is alert. She has normal reflexes.  Skin: Skin is warm and dry. She is not diaphoretic.  Psychiatric: Mood and affect normal.  Nursing note and vitals reviewed.     Assessment & Plan  Problem List Items Addressed This Visit    None    Visit  Diagnoses    Thoracic disc disease       Relevant Medications   meloxicam (MOBIC) 15 MG tablet      Meds ordered this encounter  Medications  . meloxicam (MOBIC) 15 MG tablet    Sig: TAKE 1 TABLET(15 MG) BY MOUTH DAILY    Dispense:  90 tablet    Refill:  0    **Patient requests 90 days supply**      Dr. [facility]  05/05/17

## 2017-05-05 NOTE — Patient Instructions (Addendum)
Back Exercises The following exercises strengthen the muscles that help to support the back. They also help to keep the lower back flexible. Doing these exercises can help to prevent back pain or lessen existing pain. If you have back pain or discomfort, try doing these exercises 2-3 times each day or as told by your health care provider. When the pain goes away, do them once each day, but increase the number of times that you repeat the steps for each exercise (do more repetitions). If you do not have back pain or discomfort, do these exercises once each day or as told by your health care provider. Exercises Single Knee to Chest  Repeat these steps 3-5 times for each leg: Lie on your back on a firm bed or the floor with your legs extended. Bring one knee to your chest. Your other leg should stay extended and in contact with the floor. Hold your knee in place by grabbing your knee or thigh. Pull on your knee until you feel a gentle stretch in your lower back. Hold the stretch for 10-30 seconds. Slowly release and straighten your leg.  Pelvic Tilt  Repeat these steps 5-10 times: Lie on your back on a firm bed or the floor with your legs extended. Bend your knees so they are pointing toward the ceiling and your feet are flat on the floor. Tighten your lower abdominal muscles to press your lower back against the floor. This motion will tilt your pelvis so your tailbone points up toward the ceiling instead of pointing to your feet or the floor. With gentle tension and even breathing, hold this position for 5-10 seconds.  Cat-Cow  Repeat these steps until your lower back becomes more flexible: Get into a hands-and-knees position on a firm surface. Keep your hands under your shoulders, and keep your knees under your hips. You may place padding under your knees for comfort. Let your head hang down, and point your tailbone toward the floor so your lower back becomes rounded like the back of a  cat. Hold this position for 5 seconds. Slowly lift your head and point your tailbone up toward the ceiling so your back forms a sagging arch like the back of a cow. Hold this position for 5 seconds.  Press-Ups  Repeat these steps 5-10 times: Lie on your abdomen (face-down) on the floor. Place your palms near your head, about shoulder-width apart. While you keep your back as relaxed as possible and keep your hips on the floor, slowly straighten your arms to raise the top half of your body and lift your shoulders. Do not use your back muscles to raise your upper torso. You may adjust the placement of your hands to make yourself more comfortable. Hold this position for 5 seconds while you keep your back relaxed. Slowly return to lying flat on the floor.  Bridges  Repeat these steps 10 times: Lie on your back on a firm surface. Bend your knees so they are pointing toward the ceiling and your feet are flat on the floor. Tighten your buttocks muscles and lift your buttocks off of the floor until your waist is at almost the same height as your knees. You should feel the muscles working in your buttocks and the back of your thighs. If you do not feel these muscles, slide your feet 1-2 inches farther away from your buttocks. Hold this position for 3-5 seconds. Slowly lower your hips to the starting position, and allow your buttocks muscles to  relax completely.  If this exercise is too easy, try doing it with your arms crossed over your chest. Abdominal Crunches  Repeat these steps 5-10 times: Lie on your back on a firm bed or the floor with your legs extended. Bend your knees so they are pointing toward the ceiling and your feet are flat on the floor. Cross your arms over your chest. Tip your chin slightly toward your chest without bending your neck. Tighten your abdominal muscles and slowly raise your trunk (torso) high enough to lift your shoulder blades a tiny bit off of the floor. Avoid  raising your torso higher than that, because it can put too much stress on your low back and it does not help to strengthen your abdominal muscles. Slowly return to your starting position.  Back Lifts Repeat these steps 5-10 times: Lie on your abdomen (face-down) with your arms at your sides, and rest your forehead on the floor. Tighten the muscles in your legs and your buttocks. Slowly lift your chest off of the floor while you keep your hips pressed to the floor. Keep the back of your head in line with the curve in your back. Your eyes should be looking at the floor. Hold this position for 3-5 seconds. Slowly return to your starting position.  Contact a health care provider if: Your back pain or discomfort gets much worse when you do an exercise. Your back pain or discomfort does not lessen within 2 hours after you exercise. If you have any of these problems, stop doing these exercises right away. Do not do them again unless your health care provider says that you can. Get help right away if: You develop sudden, severe back pain. If this happens, stop doing the exercises right away. Do not do them again unless your health care provider says that you can. This information is not intended to replace advice given to you by your health care provider. Make sure you discuss any questions you have with your health care provider. Document Released: 02/28/2004 Document Revised: 05/30/2015 Document Reviewed: 03/16/2014 Elsevier Interactive Patient Education  2017 [employer] Herniated Disk A herniated disk, also called a ruptured disk or slipped disk, occurs when a disk in the spine bulges out too far. Between the bones in the spine (vertebrae), there are oval disks that are made of a soft, spongy center that is surrounded by a tough outer ring. The disks connect your vertebrae, help your spine move, and absorb shocks from your movement. When you have a herniated disk, the spongy center of the disk  bulges out or breaks through the outer ring. It can press on a nerve between the vertebrae and cause pain. This can occur anywhere in the back or neck area, but the lower back is most commonly affected. What are the causes? This condition may be caused by:  Age-related wear and tear. The spongy centers of spinal disks tend to shrink and dry out with age, which makes them more likely to herniate.  Sudden injury, such as a strain or sprain.  What increases the risk? Aging is the main risk factor for a herniated disk. Other risk factors include:  Being a man who is 72-21 years old.  Frequently doing activities that involve heavy lifting, bending, or twisting.  Frequently driving for long hours at a time.  Not getting enough exercise.  Being overweight.  Smoking.  Having a family history of back problems or herniated disks.  Being pregnant or giving birth.  Having poor nutrition.  Being tall.  What are the signs or symptoms? Symptoms may vary depending on where your herniated disk is located.  A herniated disk in the lower back may cause sharp pain in: ? Part of the arm, leg, hip, or buttocks. ? The back of the lower leg (calf). ? The lower back, spreading down through the leg into the foot (sciatica).  A herniated disk in the neck may cause dizziness and vertigo. It may also cause pain or weakness in: ? The neck. ? The shoulder blades. ? Upper arm, forearm, or fingers.  You may also have muscle weakness. It may be difficult to: ? Lift your leg or arm. ? Stand on your toes. ? Squeeze tightly with one of your hands.  Other symptoms may include: ? Numbness or tingling in the affected areas of the hands, arms, feet, or legs. ? Inability to control when you urinate or when you have bowel movements. This is a rare but serious sign of a severe herniated disk in the lower back.  How is this diagnosed? This condition may be diagnosed based on:  Your symptoms.  Your  medical history.  A physical exam. The exam may include: ? Straight-leg test. You will lie on your back while your health care provider lifts your leg, keeping your knee straight. If you feel pain, you likely have a herniated disk. ? Neurological tests. This includes checking for numbness, reflexes, muscle strength, and posture.  Imaging tests, such as: ? X-rays. ? MRI. ? CT scan. ? Electromyogram (EMG) to check the nerves that control muscles. This test may be used to determine which nerves are affected by your herniated disk.  How is this treated? Treatment for this condition may include:  A short period of rest. This is usually the first treatment. ? You may be on bed rest for up to 2 days, or you may be instructed to stay home and avoid physical activity. ? If you have a herniated disk in your lower back, avoid sitting as much as possible. Sitting increases pressure on the disk.  Medicines. These may include: ? NSAIDs to help reduce pain and swelling. ? Muscle relaxants to prevent sudden tightening of the back muscles (back spasms). ? Prescription pain medicines, if you have severe pain.  Steroid injections in the area of the herniated disk. This can help reduce pain and swelling.  Physical therapy to strengthen your back muscles.  In many cases, symptoms go away with treatment over a period of days or weeks. You will most likely be free of symptoms after 3-4 months. If other treatments do not help to relieve your symptoms, you may need surgery. Follow these instructions at home: Medicines  Take over-the-counter and prescription medicines only as told by your health care provider.  Do not drive or use heavy machinery while taking prescription pain medicine. Activity  Rest as directed.  After your rest period: ? Return to your normal activities and gradually begin exercising as told by your health care provider. Ask your health care provider what activities and exercises  are safe for you. ? Use good posture. ? Avoid movements that cause pain. ? Do not lift anything that is heavier than 10 lb (4.5 kg) until your health care provider says this is safe. ? Do not sit or stand for long periods of time without changing positions. ? Do not sit for long periods of time without getting up and moving around.  If physical therapy was  prescribed, do exercises as instructed.  Aim to strengthen muscles in your back and abdomen with exercises like crunches, swimming, or walking. General instructions  Do not use any products that contain nicotine or tobacco, such as cigarettes and e-cigarettes. These products can delay healing. If you need help quitting, ask your health care provider.  Do not wear high-heeled shoes.  Do not sleep on your belly.  If you are overweight, work with your health care provider to lose weight safely.  To prevent or treat constipation while you are taking prescription pain medicine, your health care provider may recommend that you: ? Drink enough fluid to keep your urine clear or pale yellow. ? Take over-the-counter or prescription medicines. ? Eat foods that are high in fiber, such as fresh fruits and vegetables, whole grains, and beans. ? Limit foods that are high in fat and processed sugars, such as fried and sweet foods.  Keep all follow-up visits as told by your health care provider. This is important. How is this prevented?  Maintain a healthy weight.  Try to avoid stressful situations.  Maintain physical fitness. Do at least 150 minutes of moderate-intensity exercise each week, such as brisk walking or water aerobics.  When lifting objects: ? Keep your feet at least shoulder-width apart and tighten your abdominal muscles. ? Keep your spine neutral as you bend your knees and hips. It is important to lift using the strength of your legs, not your back. Do not lock your knees straight out. ? Always ask for help to lift heavy or  awkward objects. Contact a health care provider if:  You have back pain or neck pain that does not get better after 6 weeks.  You have severe pain in your back, neck, legs, or arms.  You develop numbness, tingling, or weakness in any part of your body. Get help right away if:  You cannot move your arms or legs.  You cannot control when you urinate or have bowel movements.  You feel dizzy or you faint.  You have shortness of breath. This information is not intended to replace advice given to you by your health care provider. Make sure you discuss any questions you have with your health care provider. Document Released: 01/18/2000 Document Revised: 09/17/2015 Document Reviewed: 09/17/2015 Elsevier Interactive Patient Education  2017 [place] are compounds that affect the level of uric acid in your body. A low-purine diet is a diet that is low in purines. Eating a low-purine diet can prevent the level of uric acid in your body from getting too high and causing gout or kidney stones or both. What do I need to know about this diet?  Choose low-purine foods. Examples of low-purine foods are listed in the next section.  Drink plenty of fluids, especially water. Fluids can help remove uric acid from your body. Try to drink 8-16 cups (1.9-3.8 L) a day.  Limit foods high in fat, especially saturated fat, as fat makes it harder for the body to get rid of uric acid. Foods high in saturated fat include pizza, cheese, ice cream, whole milk, fried foods, and gravies. Choose foods that are lower in fat and lean sources of protein. Use olive oil when cooking as it contains healthy fats that are not high in saturated fat.  Limit alcohol. Alcohol interferes with the elimination of uric acid from your body. If you are having a gout attack, avoid all alcohol.  Keep in mind that different people's bodies  react differently to different foods. You will probably learn over time  which foods do or do not affect you. If you discover that a food tends to cause your gout to flare up, avoid eating that food. You can more freely enjoy foods that do not cause problems. If you have any questions about a food item, talk to your dietitian or health care provider. Which foods are low, moderate, and high in purines? The following is a list of foods that are low, moderate, and high in purines. You can eat any amount of the foods that are low in purines. You may be able to have small amounts of foods that are moderate in purines. Ask your health care provider how much of a food moderate in purines you can have. Avoid foods high in purines. Grains  Foods low in purines: Enriched white bread, pasta, rice, cake, cornbread, popcorn.  Foods moderate in purines: Whole-grain breads and cereals, wheat germ, bran, oatmeal. Uncooked oatmeal. Dry wheat bran or wheat germ.  Foods high in purines: Pancakes, [place] toast, biscuits, muffins. Vegetables  Foods low in purines: All vegetables, except those that are moderate in purines.  Foods moderate in purines: Asparagus, cauliflower, spinach, mushrooms, green peas. Fruits  All fruits are low in purines. Meats and other Protein Foods  Foods low in purines: Eggs, nuts, peanut butter.  Foods moderate in purines: 80-90% lean beef, lamb, veal, pork, poultry, fish, eggs, peanut butter, nuts. Crab, lobster, oysters, and shrimp. Cooked dried beans, peas, and lentils.  Foods high in purines: Anchovies, sardines, herring, mussels, tuna, codfish, scallops, trout, and haddock. Berniece Salines. Organ meats (such as liver or kidney). Tripe. Game meat. Goose. Sweetbreads. Dairy  All dairy foods are low in purines. Low-fat and fat-free dairy products are best because they are low in saturated fat. Beverages  Drinks low in purines: Water, carbonated beverages, tea, coffee, cocoa.  Drinks moderate in purines: Soft drinks and other drinks sweetened with  high-fructose corn syrup. Juices. To find whether a food or drink is sweetened with high-fructose corn syrup, look at the ingredients list.  Drinks high in purines: Alcoholic beverages (such as beer). Condiments  Foods low in purines: Salt, herbs, olives, pickles, relishes, vinegar.  Foods moderate in purines: Butter, margarine, oils, mayonnaise. Fats and Oils  Foods low in purines: All types, except gravies and sauces made with meat.  Foods high in purines: Gravies and sauces made with meat. Other Foods  Foods low in purines: Sugars, sweets, gelatin. Cake. Soups made without meat.  Foods moderate in purines: Meat-based or fish-based soups, broths, or bouillons. Foods and drinks sweetened with high-fructose corn syrup.  Foods high in purines: High-fat desserts (such as ice cream, cookies, cakes, pies, doughnuts, and chocolate). Contact your dietitian for more information on foods that are not listed here. This information is not intended to replace advice given to you by your health care provider. Make sure you discuss any questions you have with your health care provider. Document Released: 05/17/2010 Document Revised: 06/28/2015 Document Reviewed: 12/27/2012 Elsevier Interactive Patient Education  2017 [employer].

## 2017-05-06 DIAGNOSIS — D485 Neoplasm of uncertain behavior of skin: Secondary | ICD-10-CM | POA: Diagnosis not present

## 2017-05-06 DIAGNOSIS — L821 Other seborrheic keratosis: Secondary | ICD-10-CM | POA: Diagnosis not present

## 2017-05-06 DIAGNOSIS — Z859 Personal history of malignant neoplasm, unspecified: Secondary | ICD-10-CM | POA: Diagnosis not present

## 2017-05-06 DIAGNOSIS — L570 Actinic keratosis: Secondary | ICD-10-CM | POA: Diagnosis not present

## 2017-05-06 DIAGNOSIS — I781 Nevus, non-neoplastic: Secondary | ICD-10-CM | POA: Diagnosis not present

## 2017-05-06 DIAGNOSIS — L578 Other skin changes due to chronic exposure to nonionizing radiation: Secondary | ICD-10-CM | POA: Diagnosis not present

## 2017-05-06 DIAGNOSIS — D0371 Melanoma in situ of right lower limb, including hip: Secondary | ICD-10-CM | POA: Diagnosis not present

## 2017-05-27 DIAGNOSIS — C4371 Malignant melanoma of right lower limb, including hip: Secondary | ICD-10-CM | POA: Diagnosis not present

## 2017-05-27 DIAGNOSIS — D0371 Melanoma in situ of right lower limb, including hip: Secondary | ICD-10-CM | POA: Diagnosis not present

## 2017-06-30 ENCOUNTER — Other Ambulatory Visit: Payer: Self-pay | Admitting: Family Medicine

## 2017-06-30 DIAGNOSIS — Z1231 Encounter for screening mammogram for malignant neoplasm of breast: Secondary | ICD-10-CM

## 2017-07-23 ENCOUNTER — Ambulatory Visit
Admission: RE | Admit: 2017-07-23 | Discharge: 2017-07-23 | Disposition: A | Discharge status: Home / Self Care | Payer: Medicare Other | Source: Ambulatory Visit | Attending: Family Medicine | Admitting: Family Medicine

## 2017-07-23 DIAGNOSIS — Z1231 Encounter for screening mammogram for malignant neoplasm of breast: Secondary | ICD-10-CM | POA: Insufficient documentation

## 2017-08-05 ENCOUNTER — Ambulatory Visit (INDEPENDENT_AMBULATORY_CARE_PROVIDER_SITE_OTHER): Payer: Medicare Other

## 2017-08-05 VITALS — BP 104/64 | HR 78 | Temp 98.1°F | Resp 12 | Ht 64.0 in | Wt 179.0 lb

## 2017-08-05 DIAGNOSIS — Z0000 Encounter for general adult medical examination without abnormal findings: Secondary | ICD-10-CM | POA: Diagnosis not present

## 2017-08-05 NOTE — Progress Notes (Signed)
Subjective:   Kiara Becker is a 72 y.o. female who presents for Medicare Annual (Subsequent) preventive examination.  Review of Systems:  N/A Cardiac Risk Factors include: advanced age (>71men, >67 women);dyslipidemia;hypertension;obesity (BMI >30kg/m2)     Objective:     Vitals: BP 104/64 (BP Location: Right Arm, Patient Position: Sitting, Cuff Size: Normal)   Pulse 78   Temp 98.1 F (36.7 C) (Oral)   Resp 12   Ht 5\' 4"  (1.626 m)   Wt 179 lb (81.2 kg)   SpO2 95%   BMI 30.73 kg/m   Body mass index is 30.73 kg/m.  Advanced Directives 08/05/2017 08/04/2016 12/20/2015 10/23/2014 07/24/2014  Does Patient Have a Medical Advance Directive? No No No No No  Would patient like information on creating a medical advance directive? Yes (MAU/Ambulatory/Procedural Areas - Information given) Yes (MAU/Ambulatory/Procedural Areas - Information given) - No - patient declined information No - patient declined information    Tobacco Social History   Tobacco Use  Smoking Status Never Smoker  Smokeless Tobacco Never Used  Tobacco Comment   smoking cessation materials not required     Counseling given: No Comment: smoking cessation materials not required  Clinical Intake:  Pre-visit preparation completed: Yes  Pain : No/denies pain   BMI - recorded: 30.73 Nutritional Status: BMI > 30  Obese Nutritional Risks: None Diabetes: No  How often do you need to have someone help you when you read instructions, pamphlets, or other written materials from your doctor or pharmacy?: 1 - Never  Interpreter Needed?: No  Information entered by :: AEversole, LPN  Past Medical History:  Diagnosis Date  . Allergy   . Asthma   . Cancer ([facility]) 2001   melanoma  . Colon cancer ([facility]) 2009  . Hyperlipidemia   . Hypertension   . Ventral hernia    Past Surgical History:  Procedure Laterality Date  . CHOLECYSTECTOMY  11/05/2010   Dr. Pat Patrick  . COLECTOMY  10/13/2007   Laparoscopic assisting ascending  colectomy  . COLON SURGERY  2009   remove a mass  . COLONOSCOPY  03/2014   normal- cleared for 5 years- Dr Vira Agar  . GALLBLADDER SURGERY    . HERNIA REPAIR    . KNEE SURGERY Bilateral    menisacal repair and then replacement  . SKIN SURGERY     removal melanoma on arm and eye  . VAGINAL HYSTERECTOMY  1984   Family History  Problem Relation Age of Onset  . Heart disease Mother   . Liver cancer Mother   . Heart disease Father   . Heart disease Brother   . Drug abuse Maternal Grandfather    Social History   Socioeconomic History  . Marital status: Married    Spouse name: Not on file  . Number of children: 2  . Years of education: some college  . Highest education level: 12th grade  Occupational History  . Occupation: Retired  Scientific laboratory technician  . Financial resource strain: Not hard at all  . Food insecurity:    Worry: Never true    Inability: Never true  . Transportation needs:    Medical: No    Non-medical: No  Tobacco Use  . Smoking status: Never Smoker  . Smokeless tobacco: Never Used  . Tobacco comment: smoking cessation materials not required  Substance and Sexual Activity  . Alcohol use: No    Alcohol/week: 0.0 oz  . Drug use: No  . Sexual activity: Yes  Birth control/protection: None  Lifestyle  . Physical activity:    Days per week: 6 days    Minutes per session: 60 min  . Stress: Not at all  Relationships  . Social connections:    Talks on phone: Patient refused    Gets together: Patient refused    Attends religious service: Patient refused    Active member of club or organization: Patient refused    Attends meetings of clubs or organizations: Patient refused    Relationship status: Married  Other Topics Concern  . Not on file  Social History Narrative  . Not on file    Outpatient Encounter Medications as of 08/05/2017  Medication Sig  . albuterol (PROVENTIL HFA;VENTOLIN HFA) 108 (90 Base) MCG/ACT inhaler Inhale 2 puffs into the lungs every 6  (six) hours as needed for wheezing or shortness of breath.  . allopurinol (ZYLOPRIM) 100 MG tablet TAKE 1 TABLET(100 MG) BY MOUTH DAILY  . Calcium Carbonate-Vitamin D (CALTRATE 600+D PO) Take 1 tablet by mouth daily.  . cyclobenzaprine (FLEXERIL) 10 MG tablet Take 1 tablet (10 mg total) by mouth at bedtime.  . meclizine (ANTIVERT) 25 MG tablet Take 1 tablet (25 mg total) by mouth 3 (three) times daily as needed for dizziness.  . meloxicam (MOBIC) 15 MG tablet TAKE 1 TABLET(15 MG) BY MOUTH DAILY  . Red Yeast Rice 600 MG CAPS Take by mouth.  . torsemide (DEMADEX) 10 MG tablet Take 10 mg by mouth as needed (twice a week as needed for ankle swelling). Dr Nehemiah Massed  . triamterene-hydrochlorothiazide (MAXZIDE-25) 37.5-25 MG tablet Take 0.5 tablets by mouth as directed. 3 times qweek- Dr Nehemiah Massed  . cholecalciferol (VITAMIN D) 400 units TABS tablet Take 2,000 Units by mouth.  . Multiple Vitamins-Minerals (CENTRUM SILVER ULTRA WOMENS PO) Take 1 tablet by mouth daily.   Facility-Administered Encounter Medications as of 08/05/2017  Medication  . albuterol (PROVENTIL) (2.5 MG/3ML) 0.083% nebulizer solution 2.5 mg    Activities of Daily Living In your present state of health, do you have any difficulty performing the following activities: 08/05/2017  Hearing? N  Comment B hearing aids  Vision? N  Comment wears eyeglasses  Difficulty concentrating or making decisions? N  Walking or climbing stairs? N  Dressing or bathing? N  Doing errands, shopping? N  Preparing Food and eating ? N  Comment denies dentures  Using the Toilet? N  In the past six months, have you accidently leaked urine? N  Do you have problems with loss of bowel control? N  Managing your Medications? N  Managing your Finances? N  Housekeeping or managing your Housekeeping? N  Some recent data might be hidden    Patient Care Team: Juline Patch, MD as PCP - General (Family Medicine) Jannet Mantis, MD  (Dermatology) Corey Skains, MD as Consulting Physician (Cardiology)    Assessment:   This is a routine wellness examination for Kiara Becker.  Exercise Activities and Dietary recommendations Current Exercise Habits: Structured exercise class, Type of exercise: walking, Time (Minutes): 60, Frequency (Times/Week): 6, Weekly Exercise (Minutes/Week): 360, Intensity: Mild, Exercise limited by: None identified  Goals    . DIET - INCREASE WATER INTAKE     Recommend to drink at least 6-8 8oz glasses of water per day.       Fall Risk Fall Risk  08/05/2017 08/04/2016 12/20/2015 07/25/2015 05/01/2015  Falls in the past year? No No No No No  Risk for fall due to : Impaired vision - - - -  FALL RISK PREVENTION PERTAINING TO HOME: Is your home free of loose throw rugs in walkways, pet beds, electrical cords, etc? Yes Is there adequate lighting in your home to reduce risk of falls?  Yes Are there stairs in or around your home WITH handrails? Yes  ASSISTIVE DEVICES UTILIZED TO PREVENT FALLS: Use of a cane, walker or w/c? No Grab bars in the bathroom? No  Shower chair or a place to sit while bathing? Yes An elevated toilet seat or a handicapped toilet? Yes  Timed Get Up and Go Performed: Yes. Pt ambulated 10 feet within 8 sec. Gait slow, steady and without the use of an assistive device. No intervention required at this time. Fall risk prevention has been discussed.  Community Resource Referral:  Pt declined my offer to send [employer] Referral to Care Guide for installation of grab bars in the shower.  Depression Screen PHQ 2/9 Scores 08/05/2017 08/04/2016 12/20/2015 07/25/2015  PHQ - 2 Score 0 0 0 0  PHQ- 9 Score 0 - - -     Cognitive Function     6CIT Screen 08/05/2017 08/04/2016  What Year? 0 points 0 points  What month? 0 points 0 points  What time? 0 points 0 points  Count back from 20 0 points 0 points  Months in reverse 0 points 0 points  Repeat phrase 0 points 2 points  Total  Score 0 2    Immunization History  Administered Date(s) Administered  . Influenza,inj,Quad PF,6+ Mos 12/04/2014  . Influenza-Unspecified 12/16/2016  . Pneumococcal Conjugate-13 07/25/2015  . Pneumococcal Polysaccharide-23 06/24/2012  . Tdap 06/24/2012  . Zoster 06/04/2014    Qualifies for Shingles Vaccine? Yes. Zostavax completed 06/04/14. Due for Shingrix. Education has been provided regarding the importance of this vaccine. Pt has been advised to call her insurance company to determine her out of pocket expense. Advised she may also receive this vaccine at her local pharmacy or Health Dept. Verbalized acceptance and understanding.  Screening Tests Health Maintenance  Topic Date Due  . INFLUENZA VACCINE  09/03/2017  . MAMMOGRAM  07/24/2018  . COLONOSCOPY  03/06/2020  . TETANUS/TDAP  06/25/2022  . DEXA SCAN  Completed  . Hepatitis C Screening  Completed  . PNA vac Low Risk Adult  Completed    Cancer Screenings: Lung: Low Dose CT Chest recommended if Age 76-80 years, 30 pack-year currently smoking OR have quit w/in 15years. Patient does not qualify. Breast:  Up to date on Mammogram? Yes. Completed 07/23/17. Repeat every year   Up to date of Bone Density/Dexa? Yes. Completed 08/25/16. Osteoporotic screenings no longer required Colorectal: Completed 03/07/15. Repeat every 5 years  Additional Screenings: Hepatitis C Screening: Completed 08/11/16    Plan:  I have personally reviewed and addressed the Medicare Annual Wellness questionnaire and have noted the following in the patient's chart:  A. Medical and social history B. Use of alcohol, tobacco or illicit drugs  C. Current medications and supplements D. Functional ability and status E.  Nutritional status F.  Physical activity G. Advance directives H. List of other physicians I.  Hospitalizations, surgeries, and ER visits in previous 12 months J.  [place] such as hearing and vision if needed, cognitive and  depression L. Referrals and appointments  In addition, I have reviewed and discussed with patient certain preventive protocols, quality metrics, and best practice recommendations. A written personalized care plan for preventive services as well as general preventive health recommendations were provided to patient.  Signed,  Aleatha Borer, LPN Nurse Health Advisor  MD Recommendations: Zostavax completed 06/04/14. Due for Shingrix. Education has been provided regarding the importance of this vaccine. Pt has been advised to call her insurance company to determine her out of pocket expense. Advised she may also receive this vaccine at her local pharmacy or Health Dept. Verbalized acceptance and understanding.  Dr. Jacqlyn Larsen has moved and pt is requesting a referral to another Urologist who can monitor her R renal mass.

## 2017-08-05 NOTE — Patient Instructions (Signed)
Kiara Becker , Thank you for taking time to come for your Medicare Wellness Visit. I appreciate your ongoing commitment to your health goals. Please review the following plan we discussed and let me know if I can assist you in the future.   Screening recommendations/referrals: Colorectal Screening: Up to date Mammogram: Up to date Bone Density: Up to date  Vision and Dental Exams: Recommended annual ophthalmology exams for early detection of glaucoma and other disorders of the eye Recommended annual dental exams for proper oral hygiene  Vaccinations: Influenza vaccine: Up to date Pneumococcal vaccine: Up to date Tdap vaccine: Up to date Shingles vaccine: Please call your insurance company to determine your out of pocket expense for the Shingrix vaccine. You may also receive this vaccine at your local pharmacy or Health Dept.   Advanced directives: Advance directive discussed with you today. I have provided a copy for you to complete at home and have notarized. Once this is complete please bring a copy in to our office so we can scan it into your chart.  Goals: Recommend to drink at least 6-8 8oz glasses of water per day.  Next appointment: Please schedule your Annual Wellness Visit with your Nurse Health Advisor in one year.  Preventive Care 69 Years and Older, Female Preventive care refers to lifestyle choices and visits with your health care provider that can promote health and wellness. What does preventive care include?  A yearly physical exam. This is also called an annual well check.  Dental exams once or twice a year.  Routine eye exams. Ask your health care provider how often you should have your eyes checked.  Personal lifestyle choices, including:  Daily care of your teeth and gums.  Regular physical activity.  Eating a healthy diet.  Avoiding tobacco and drug use.  Limiting alcohol use.  Practicing safe sex.  Taking low-dose aspirin every day.  Taking  vitamin and mineral supplements as recommended by your health care provider. What happens during an annual well check? The services and screenings done by your health care provider during your annual well check will depend on your age, overall health, lifestyle risk factors, and family history of disease. Counseling  Your health care provider may ask you questions about your:  Alcohol use.  Tobacco use.  Drug use.  Emotional well-being.  Home and relationship well-being.  Sexual activity.  Eating habits.  History of falls.  Memory and ability to understand (cognition).  Work and work Statistician.  Reproductive health. Screening  You may have the following tests or measurements:  Height, weight, and BMI.  Blood pressure.  Lipid and cholesterol levels. These may be checked every 5 years, or more frequently if you are over 81 years old.  Skin check.  Lung cancer screening. You may have this screening every year starting at age 75 if you have a 30-pack-year history of smoking and currently smoke or have quit within the past 15 years.  Fecal occult blood test (FOBT) of the stool. You may have this test every year starting at age 70.  Flexible sigmoidoscopy or colonoscopy. You may have a sigmoidoscopy every 5 years or a colonoscopy every 10 years starting at age 77.  Hepatitis C blood test.  Hepatitis B blood test.  Sexually transmitted disease (STD) testing.  Diabetes screening. This is done by checking your blood sugar (glucose) after you have not eaten for a while (fasting). You may have this done every 1-3 years.  Bone density scan. This is  done to screen for osteoporosis. You may have this done starting at age 21.  Mammogram. This may be done every 1-2 years. Talk to your health care provider about how often you should have regular mammograms. Talk with your health care provider about your test results, treatment options, and if necessary, the need for more  tests. Vaccines  Your health care provider may recommend certain vaccines, such as:  Influenza vaccine. This is recommended every year.  Tetanus, diphtheria, and acellular pertussis (Tdap, Td) vaccine. You may need a Td booster every 10 years.  Zoster vaccine. You may need this after age 78.  Pneumococcal 13-valent conjugate (PCV13) vaccine. One dose is recommended after age 29.  Pneumococcal polysaccharide (PPSV23) vaccine. One dose is recommended after age 59. Talk to your health care provider about which screenings and vaccines you need and how often you need them. This information is not intended to replace advice given to you by your health care provider. Make sure you discuss any questions you have with your health care provider. Document Released: 02/16/2015 Document Revised: 10/10/2015 Document Reviewed: 11/21/2014 Elsevier Interactive Patient Education  2017 [place] Prevention in the Home Falls can cause injuries. They can happen to people of all ages. There are many things you can do to make your home safe and to help prevent falls. What can I do on the outside of my home?  Regularly fix the edges of walkways and driveways and fix any cracks.  Remove anything that might make you trip as you walk through a door, such as a raised step or threshold.  Trim any bushes or trees on the path to your home.  Use bright outdoor lighting.  Clear any walking paths of anything that might make someone trip, such as rocks or tools.  Regularly check to see if handrails are loose or broken. Make sure that both sides of any steps have handrails.  Any raised decks and porches should have guardrails on the edges.  Have any leaves, snow, or ice cleared regularly.  Use sand or salt on walking paths during winter.  Clean up any spills in your garage right away. This includes oil or grease spills. What can I do in the bathroom?  Use night lights.  Install grab bars by the  toilet and in the tub and shower. Do not use towel bars as grab bars.  Use non-skid mats or decals in the tub or shower.  If you need to sit down in the shower, use a plastic, non-slip stool.  Keep the floor dry. Clean up any water that spills on the floor as soon as it happens.  Remove soap buildup in the tub or shower regularly.  Attach bath mats securely with double-sided non-slip rug tape.  Do not have throw rugs and other things on the floor that can make you trip. What can I do in the bedroom?  Use night lights.  Make sure that you have a light by your bed that is easy to reach.  Do not use any sheets or blankets that are too big for your bed. They should not hang down onto the floor.  Have a firm chair that has side arms. You can use this for support while you get dressed.  Do not have throw rugs and other things on the floor that can make you trip. What can I do in the kitchen?  Clean up any spills right away.  Avoid walking on wet floors.  Keep  items that you use a lot in easy-to-reach places.  If you need to reach something above you, use a strong step stool that has a grab bar.  Keep electrical cords out of the way.  Do not use floor polish or wax that makes floors slippery. If you must use wax, use non-skid floor wax.  Do not have throw rugs and other things on the floor that can make you trip. What can I do with my stairs?  Do not leave any items on the stairs.  Make sure that there are handrails on both sides of the stairs and use them. Fix handrails that are broken or loose. Make sure that handrails are as long as the stairways.  Check any carpeting to make sure that it is firmly attached to the stairs. Fix any carpet that is loose or worn.  Avoid having throw rugs at the top or bottom of the stairs. If you do have throw rugs, attach them to the floor with carpet tape.  Make sure that you have a light switch at the top of the stairs and the bottom of  the stairs. If you do not have them, ask someone to add them for you. What else can I do to help prevent falls?  Wear shoes that:  Do not have high heels.  Have rubber bottoms.  Are comfortable and fit you well.  Are closed at the toe. Do not wear sandals.  If you use a stepladder:  Make sure that it is fully opened. Do not climb a closed stepladder.  Make sure that both sides of the stepladder are locked into place.  Ask someone to hold it for you, if possible.  Clearly mark and make sure that you can see:  Any grab bars or handrails.  First and last steps.  Where the edge of each step is.  Use tools that help you move around (mobility aids) if they are needed. These include:  Canes.  Walkers.  Scooters.  Crutches.  Turn on the lights when you go into a dark area. Replace any light bulbs as soon as they burn out.  Set up your furniture so you have a clear path. Avoid moving your furniture around.  If any of your floors are uneven, fix them.  If there are any pets around you, be aware of where they are.  Review your medicines with your doctor. Some medicines can make you feel dizzy. This can increase your chance of falling. Ask your doctor what other things that you can do to help prevent falls. This information is not intended to replace advice given to you by your health care provider. Make sure you discuss any questions you have with your health care provider. Document Released: 11/16/2008 Document Revised: 06/28/2015 Document Reviewed: 02/24/2014 Elsevier Interactive Patient Education  2017 [employer].

## 2017-08-07 ENCOUNTER — Other Ambulatory Visit: Payer: Self-pay

## 2017-08-07 DIAGNOSIS — N2889 Other specified disorders of kidney and ureter: Secondary | ICD-10-CM

## 2017-08-17 ENCOUNTER — Ambulatory Visit (INDEPENDENT_AMBULATORY_CARE_PROVIDER_SITE_OTHER): Payer: Medicare Other | Admitting: Family Medicine

## 2017-08-17 ENCOUNTER — Encounter: Payer: Self-pay | Admitting: Family Medicine

## 2017-08-17 VITALS — BP 120/80 | HR 68 | Ht 64.0 in | Wt 172.0 lb

## 2017-08-17 DIAGNOSIS — E782 Mixed hyperlipidemia: Secondary | ICD-10-CM | POA: Diagnosis not present

## 2017-08-17 DIAGNOSIS — R69 Illness, unspecified: Secondary | ICD-10-CM | POA: Diagnosis not present

## 2017-08-17 DIAGNOSIS — E790 Hyperuricemia without signs of inflammatory arthritis and tophaceous disease: Secondary | ICD-10-CM | POA: Diagnosis not present

## 2017-08-17 DIAGNOSIS — Z0000 Encounter for general adult medical examination without abnormal findings: Secondary | ICD-10-CM

## 2017-08-17 MED ORDER — ALLOPURINOL 100 MG PO TABS: ORAL_TABLET | ORAL | 3 refills | Status: DC

## 2017-08-17 NOTE — Progress Notes (Signed)
Name: Kiara Becker   MRN: [id number]    DOB: [birth year]   Date:08/17/2017       Progress Note  Subjective  Chief Complaint  Chief Complaint  Patient presents with  . Annual Exam    pt up to date on everything    Patient is a *17** year old female who presents for a comprehensive physical exam. The patient reports the following problems: none. Health maintenance has been reviewed all up to date.   Mixed hyperlipidemia Previously elevated TG,LDL, and decreased HDL. Recheck lipid panel .   Past Medical History:  Diagnosis Date  . Allergy   . Asthma   . Cancer ([place]) 2001   melanoma  . Colon cancer ([place]) 2009  . Hyperlipidemia   . Hypertension   . Ventral hernia     Past Surgical History:  Procedure Laterality Date  . CHOLECYSTECTOMY  11/05/2010   Dr. Pat Patrick  . COLECTOMY  10/13/2007   Laparoscopic assisting ascending colectomy  . COLON SURGERY  2009   remove a mass  . COLONOSCOPY  03/2014   normal- cleared for 5 years- Dr Vira Agar  . GALLBLADDER SURGERY    . HERNIA REPAIR    . KNEE SURGERY Bilateral    menisacal repair and then replacement  . SKIN SURGERY     removal melanoma on arm and eye  . VAGINAL HYSTERECTOMY  1984    Family History  Problem Relation Age of Onset  . Heart disease Mother   . Liver cancer Mother   . Heart disease Father   . Heart disease Brother   . Drug abuse Maternal Grandfather     Social History   Socioeconomic History  . Marital status: Married    Spouse name: Not on file  . Number of children: 2  . Years of education: some college  . Highest education level: 12th grade  Occupational History  . Occupation: Retired  Scientific laboratory technician  . Financial resource strain: Not hard at all  . Food insecurity:    Worry: Never true    Inability: Never true  . Transportation needs:    Medical: No    Non-medical: No  Tobacco Use  . Smoking status: Never Smoker  . Smokeless tobacco: Never Used  . Tobacco comment: smoking cessation materials not  required  Substance and Sexual Activity  . Alcohol use: No    Alcohol/week: 0.0 oz  . Drug use: No  . Sexual activity: Yes    Birth control/protection: None  Lifestyle  . Physical activity:    Days per week: 6 days    Minutes per session: 60 min  . Stress: Not at all  Relationships  . Social connections:    Talks on phone: Patient refused    Gets together: Patient refused    Attends religious service: Patient refused    Active member of club or organization: Patient refused    Attends meetings of clubs or organizations: Patient refused    Relationship status: Married  . Intimate partner violence:    Fear of current or ex partner: No    Emotionally abused: No    Physically abused: No    Forced sexual activity: No  Other Topics Concern  . Not on file  Social History Narrative  . Not on file    Allergies  Allergen Reactions  . Choline Fenofibrate Other (See Comments)    Roaring in ears  . Codeine Other (See Comments)    Unknown  . Codeine Sulfate  Other (See Comments)  . Ezetimibe Other (See Comments)    Roaring in ears  . Fenofibrate Other (See Comments)    Roaring in ears  . Fenofibric Acid Other (See Comments)    Roaring in ears  . Gemfibrozil Other (See Comments)    Outpatient Medications Prior to Visit  Medication Sig Dispense Refill  . albuterol (PROVENTIL HFA;VENTOLIN HFA) 108 (90 Base) MCG/ACT inhaler Inhale 2 puffs into the lungs every 6 (six) hours as needed for wheezing or shortness of breath. 1 Inhaler 11  . Calcium Carbonate-Vitamin D (CALTRATE 600+D PO) Take 1 tablet by mouth daily.    . meclizine (ANTIVERT) 25 MG tablet Take 1 tablet (25 mg total) by mouth 3 (three) times daily as needed for dizziness. 30 tablet 11  . meloxicam (MOBIC) 15 MG tablet TAKE 1 TABLET(15 MG) BY MOUTH DAILY 90 tablet 0  . Red Yeast Rice 600 MG CAPS Take by mouth.    . torsemide (DEMADEX) 10 MG tablet Take 10 mg by mouth as needed (twice a week as needed for ankle swelling).  Dr Nehemiah Massed    . triamterene-hydrochlorothiazide (MAXZIDE-25) 37.5-25 MG tablet Take 0.5 tablets by mouth as directed. 3 times qweek- Dr Nehemiah Massed    . allopurinol (ZYLOPRIM) 100 MG tablet TAKE 1 TABLET(100 MG) BY MOUTH DAILY 90 tablet 1  . cholecalciferol (VITAMIN D) 400 units TABS tablet Take 2,000 Units by mouth.    . cyclobenzaprine (FLEXERIL) 10 MG tablet Take 1 tablet (10 mg total) by mouth at bedtime. (Patient not taking: Reported on 08/17/2017) 30 tablet 0  . Multiple Vitamins-Minerals (CENTRUM SILVER ULTRA WOMENS PO) Take 1 tablet by mouth daily.     Facility-Administered Medications Prior to Visit  Medication Dose Route Frequency Provider Last Rate Last Dose  . albuterol (PROVENTIL) (2.5 MG/3ML) 0.083% nebulizer solution 2.5 mg  2.5 mg Nebulization Once Otilio Miu C, MD        ROS   Objective  Vitals:   08/17/17 0854  BP: 120/80  Pulse: 68  Weight: 172 lb (78 kg)  Height: 5\' 4"  (1.626 m)    Physical Exam    Assessment & Plan  Problem List Items Addressed This Visit      Other   Mixed hyperlipidemia    Previously elevated TG,LDL, and decreased HDL. Recheck lipid panel .      Relevant Orders   Lipid panel    Other Visit Diagnoses    Annual physical exam    -  Primary   No subjective/objective concerns noted.    Hyperuricemia       Chronic stable. Continue allopurinol 100 mg daily.   Relevant Medications   allopurinol (ZYLOPRIM) 100 MG tablet   Taking medication for chronic disease       Monitor electrytes and creatinine while on NSAID   Relevant Orders   Renal Function Panel      Meds ordered this encounter  Medications  . allopurinol (ZYLOPRIM) 100 MG tablet    Sig: TAKE 1 TABLET(100 MG) BY MOUTH DAILY    Dispense:  90 tablet    Refill:  3    **Patient requests 90 days supply**      Dr. [facility]  08/17/17

## 2017-08-17 NOTE — Progress Notes (Signed)
Name: Kiara Becker   MRN: [id number]    DOB: 06-06-1945   Date:08/17/2017       Progress Note  Subjective  Chief Complaint  Chief Complaint  Patient presents with  . Annual Exam    pt up to date on everything    Patient is a 72 year old female who presents for a comprehensive physical exam. The patient reports the following problems: varicose veins. Health maintenance has been reviewed up to date.   Mixed hyperlipidemia Previously elevated TG,LDL, and decreased HDL. Recheck lipid panel .   Past Medical History:  Diagnosis Date  . Allergy   . Asthma   . Cancer ([place]) 2001   melanoma  . Colon cancer ([place]) 2009  . Hyperlipidemia   . Hypertension   . Ventral hernia     Past Surgical History:  Procedure Laterality Date  . CHOLECYSTECTOMY  11/05/2010   Dr. Pat Patrick  . COLECTOMY  10/13/2007   Laparoscopic assisting ascending colectomy  . COLON SURGERY  2009   remove a mass  . COLONOSCOPY  03/2014   normal- cleared for 5 years- Dr Vira Agar  . GALLBLADDER SURGERY    . HERNIA REPAIR    . KNEE SURGERY Bilateral    menisacal repair and then replacement  . SKIN SURGERY     removal melanoma on arm and eye  . VAGINAL HYSTERECTOMY  1984    Family History  Problem Relation Age of Onset  . Heart disease Mother   . Liver cancer Mother   . Heart disease Father   . Heart disease Brother   . Drug abuse Maternal Grandfather     Social History   Socioeconomic History  . Marital status: Married    Spouse name: Not on file  . Number of children: 2  . Years of education: some college  . Highest education level: 12th grade  Occupational History  . Occupation: Retired  Scientific laboratory technician  . Financial resource strain: Not hard at all  . Food insecurity:    Worry: Never true    Inability: Never true  . Transportation needs:    Medical: No    Non-medical: No  Tobacco Use  . Smoking status: Never Smoker  . Smokeless tobacco: Never Used  . Tobacco comment: smoking cessation materials  not required  Substance and Sexual Activity  . Alcohol use: No    Alcohol/week: 0.0 oz  . Drug use: No  . Sexual activity: Yes    Birth control/protection: None  Lifestyle  . Physical activity:    Days per week: 6 days    Minutes per session: 60 min  . Stress: Not at all  Relationships  . Social connections:    Talks on phone: Patient refused    Gets together: Patient refused    Attends religious service: Patient refused    Active member of club or organization: Patient refused    Attends meetings of clubs or organizations: Patient refused    Relationship status: Married  . Intimate partner violence:    Fear of current or ex partner: No    Emotionally abused: No    Physically abused: No    Forced sexual activity: No  Other Topics Concern  . Not on file  Social History Narrative  . Not on file    Allergies  Allergen Reactions  . Choline Fenofibrate Other (See Comments)    Roaring in ears  . Codeine Other (See Comments)    Unknown  . Codeine Sulfate  Other (See Comments)  . Ezetimibe Other (See Comments)    Roaring in ears  . Fenofibrate Other (See Comments)    Roaring in ears  . Fenofibric Acid Other (See Comments)    Roaring in ears  . Gemfibrozil Other (See Comments)    Outpatient Medications Prior to Visit  Medication Sig Dispense Refill  . albuterol (PROVENTIL HFA;VENTOLIN HFA) 108 (90 Base) MCG/ACT inhaler Inhale 2 puffs into the lungs every 6 (six) hours as needed for wheezing or shortness of breath. 1 Inhaler 11  . Calcium Carbonate-Vitamin D (CALTRATE 600+D PO) Take 1 tablet by mouth daily.    . meclizine (ANTIVERT) 25 MG tablet Take 1 tablet (25 mg total) by mouth 3 (three) times daily as needed for dizziness. 30 tablet 11  . meloxicam (MOBIC) 15 MG tablet TAKE 1 TABLET(15 MG) BY MOUTH DAILY 90 tablet 0  . Red Yeast Rice 600 MG CAPS Take by mouth.    . torsemide (DEMADEX) 10 MG tablet Take 10 mg by mouth as needed (twice a week as needed for ankle  swelling). Dr Nehemiah Massed    . triamterene-hydrochlorothiazide (MAXZIDE-25) 37.5-25 MG tablet Take 0.5 tablets by mouth as directed. 3 times qweek- Dr Nehemiah Massed    . allopurinol (ZYLOPRIM) 100 MG tablet TAKE 1 TABLET(100 MG) BY MOUTH DAILY 90 tablet 1  . cholecalciferol (VITAMIN D) 400 units TABS tablet Take 2,000 Units by mouth.    . cyclobenzaprine (FLEXERIL) 10 MG tablet Take 1 tablet (10 mg total) by mouth at bedtime. (Patient not taking: Reported on 08/17/2017) 30 tablet 0  . Multiple Vitamins-Minerals (CENTRUM SILVER ULTRA WOMENS PO) Take 1 tablet by mouth daily.     Facility-Administered Medications Prior to Visit  Medication Dose Route Frequency Provider Last Rate Last Dose  . albuterol (PROVENTIL) (2.5 MG/3ML) 0.083% nebulizer solution 2.5 mg  2.5 mg Nebulization Once Juline Patch, MD        Review of Systems  Constitutional: Negative for chills, fever, malaise/fatigue and weight loss.  HENT: Negative for ear discharge, ear pain and sore throat.   Eyes: Negative for blurred vision.  Respiratory: Negative for cough, sputum production, shortness of breath and wheezing.   Cardiovascular: Negative for chest pain, palpitations and leg swelling.  Gastrointestinal: Negative for abdominal pain, blood in stool, constipation, diarrhea, heartburn, melena and nausea.  Genitourinary: Negative for dysuria, frequency, hematuria and urgency.  Musculoskeletal: Negative for back pain, joint pain, myalgias and neck pain.  Skin: Negative for rash.  Neurological: Negative for dizziness, tingling, sensory change, focal weakness and headaches.  Endo/Heme/Allergies: Negative for environmental allergies and polydipsia. Does not bruise/bleed easily.  Psychiatric/Behavioral: Negative for depression and suicidal ideas. The patient is not nervous/anxious and does not have insomnia.      Objective  Vitals:   08/17/17 0854  BP: 120/80  Pulse: 68  Weight: 172 lb (78 kg)  Height: 5\' 4"  (1.626 m)     Physical Exam  Constitutional: She is oriented to person, place, and time. She appears well-developed and well-nourished.  HENT:  Head: Normocephalic.  Right Ear: External ear normal.  Left Ear: External ear normal.  Mouth/Throat: Oropharynx is clear and moist.  Eyes: Pupils are equal, round, and reactive to light. Conjunctivae and EOM are normal. Lids are everted and swept, no foreign bodies found. Left eye exhibits no hordeolum. No foreign body present in the left eye. Right conjunctiva is not injected. Left conjunctiva is not injected. No scleral icterus.  Neck: Normal range of motion. Neck  supple. No JVD present. No tracheal deviation present. No thyromegaly present.  Cardiovascular: Normal rate, regular rhythm, normal heart sounds and intact distal pulses. Exam reveals no gallop and no friction rub.  No murmur heard. Pulmonary/Chest: Effort normal and breath sounds normal. No respiratory distress. She has no wheezes. She has no rales.  Abdominal: Soft. Bowel sounds are normal. She exhibits no mass. There is no hepatosplenomegaly. There is no tenderness. There is no rebound and no guarding.  Musculoskeletal: Normal range of motion. She exhibits no edema or tenderness.  Lymphadenopathy:    She has no cervical adenopathy.  Neurological: She is alert and oriented to person, place, and time. She has normal strength. She displays normal reflexes. No cranial nerve deficit.  Skin: Skin is warm. No rash noted.  Psychiatric: She has a normal mood and affect. Her mood appears not anxious. She does not exhibit a depressed mood.  Nursing note and vitals reviewed.     Assessment & Plan  Problem List Items Addressed This Visit      Other   Mixed hyperlipidemia    Previously elevated TG,LDL, and decreased HDL. Recheck lipid panel .      Relevant Orders   Lipid panel    Other Visit Diagnoses    Annual physical exam    -  Primary   No subjective/objective concerns noted.     Hyperuricemia       Chronic stable. Continue allopurinol 100 mg daily.   Relevant Medications   allopurinol (ZYLOPRIM) 100 MG tablet   Taking medication for chronic disease       Monitor electrytes and creatinine while on NSAID   Relevant Orders   Renal Function Panel      Meds ordered this encounter  Medications  . allopurinol (ZYLOPRIM) 100 MG tablet    Sig: TAKE 1 TABLET(100 MG) BY MOUTH DAILY    Dispense:  90 tablet    Refill:  3    **Patient requests 90 days supply**  Kiara Becker is a 72 y.o. female who presents today for her Complete Annual Exam. She feels well. She reports exercising some. She reports she is sleeping well.  Immunizations are reviewed and recommendations provided.   Age appropriate screening tests are discussed. Counseling given for risk factor reduction interventions. Health risks of being over weight were discussed and patient was counseled on weight loss options and exercise.  Dr. [facility]  08/17/17

## 2017-08-17 NOTE — Assessment & Plan Note (Signed)
Previously elevated TG,LDL, and decreased HDL. Recheck lipid panel .

## 2017-08-18 LAB — RENAL FUNCTION PANEL
ALBUMIN: 5.3 g/dL — AB (ref 3.5–4.8)
BUN/Creatinine Ratio: 25 (ref 12–28)
BUN: 26 mg/dL (ref 8–27)
CHLORIDE: 97 mmol/L (ref 96–106)
CO2: 24 mmol/L (ref 20–29)
Calcium: 10.4 mg/dL — ABNORMAL HIGH (ref 8.7–10.3)
Creatinine, Ser: 1.03 mg/dL — ABNORMAL HIGH (ref 0.57–1.00)
GFR, EST AFRICAN AMERICAN: 63 mL/min/{1.73_m2} (ref >59)
GFR, EST NON AFRICAN AMERICAN: 54 mL/min/{1.73_m2} — AB (ref >59)
GLUCOSE: 102 mg/dL — AB (ref 65–99)
PHOSPHORUS: 4.6 mg/dL — AB (ref 2.5–4.5)
POTASSIUM: 3.6 mmol/L (ref 3.5–5.2)
SODIUM: 141 mmol/L (ref 134–144)

## 2017-08-18 LAB — LIPID PANEL
CHOL/HDL RATIO: 5 ratio — AB (ref 0.0–4.4)
Cholesterol, Total: 175 mg/dL (ref 100–199)
HDL: 35 mg/dL — AB (ref >39)
LDL Calculated: 79 mg/dL (ref 0–99)
TRIGLYCERIDES: 306 mg/dL — AB (ref 0–149)
VLDL Cholesterol Cal: 61 mg/dL — ABNORMAL HIGH (ref 5–40)

## 2017-08-25 ENCOUNTER — Other Ambulatory Visit: Payer: Self-pay | Admitting: Family Medicine

## 2017-08-25 DIAGNOSIS — M519 Unspecified thoracic, thoracolumbar and lumbosacral intervertebral disc disorder: Secondary | ICD-10-CM

## 2017-09-07 ENCOUNTER — Ambulatory Visit: Payer: Medicare Other | Admitting: Family Medicine

## 2017-09-07 ENCOUNTER — Encounter: Payer: Self-pay | Admitting: Family Medicine

## 2017-09-07 VITALS — BP 130/70 | HR 72 | Ht 64.0 in | Wt 174.0 lb

## 2017-09-07 DIAGNOSIS — N3090 Cystitis, unspecified without hematuria: Secondary | ICD-10-CM

## 2017-09-07 DIAGNOSIS — E782 Mixed hyperlipidemia: Secondary | ICD-10-CM | POA: Diagnosis not present

## 2017-09-07 LAB — POCT URINALYSIS DIPSTICK
Bilirubin, UA: NEGATIVE
GLUCOSE UA: NEGATIVE
Ketones, UA: NEGATIVE
NITRITE UA: NEGATIVE
PH UA: 8 (ref 5.0–8.0)
PROTEIN UA: POSITIVE — AB
Spec Grav, UA: <=1.005 — AB (ref 1.010–1.025)
Urobilinogen, UA: 0.2 E.U./dL

## 2017-09-07 MED ORDER — CEPHALEXIN 500 MG PO CAPS: 500.0000 mg | ORAL_CAPSULE | Freq: Three times a day (TID) | ORAL | 0 refills | Status: DC

## 2017-09-07 NOTE — Progress Notes (Signed)
Name: Kiara Becker   MRN: [id number]    DOB: 14-Nov-1945   Date:09/07/2017       Progress Note  Subjective  Chief Complaint  Chief Complaint  Patient presents with  . Urinary Frequency    pressure in lower abdomen- feels better after having a BM    Urinary Frequency   This is a new problem. The current episode started in the past 7 days. The problem occurs every urination. The problem has been gradually worsening. The quality of the pain is described as aching (suprapubic). The pain is at a severity of 7/10. The pain is moderate. There has been no fever. Associated symptoms include frequency and urgency. Pertinent negatives include no chills, discharge, flank pain, hematuria, hesitancy, nausea, sweats or vomiting. Treatments tried: positional. The treatment provided mild relief. Her past medical history is significant for kidney stones. There is no history of recurrent UTIs.    No problem-specific Assessment & Plan notes found for this encounter.   Past Medical History:  Diagnosis Date  . Allergy   . Asthma   . Cancer ([place]) 2001   melanoma  . Colon cancer ([place]) 2009  . Hyperlipidemia   . Hypertension   . Ventral hernia     Past Surgical History:  Procedure Laterality Date  . CHOLECYSTECTOMY  11/05/2010   Dr. Pat Patrick  . COLECTOMY  10/13/2007   Laparoscopic assisting ascending colectomy  . COLON SURGERY  2009   remove a mass  . COLONOSCOPY  03/2014   normal- cleared for 5 years- Dr Vira Agar  . GALLBLADDER SURGERY    . HERNIA REPAIR    . KNEE SURGERY Bilateral    menisacal repair and then replacement  . SKIN SURGERY     removal melanoma on arm and eye  . VAGINAL HYSTERECTOMY  1984    Family History  Problem Relation Age of Onset  . Heart disease Mother   . Liver cancer Mother   . Heart disease Father   . Heart disease Brother   . Drug abuse Maternal Grandfather     Social History   Socioeconomic History  . Marital status: Married    Spouse name: Not on file  .  Number of children: 2  . Years of education: some college  . Highest education level: 12th grade  Occupational History  . Occupation: Retired  Scientific laboratory technician  . Financial resource strain: Not hard at all  . Food insecurity:    Worry: Never true    Inability: Never true  . Transportation needs:    Medical: No    Non-medical: No  Tobacco Use  . Smoking status: Never Smoker  . Smokeless tobacco: Never Used  . Tobacco comment: smoking cessation materials not required  Substance and Sexual Activity  . Alcohol use: No    Alcohol/week: 0.0 oz  . Drug use: No  . Sexual activity: Yes    Birth control/protection: None  Lifestyle  . Physical activity:    Days per week: 6 days    Minutes per session: 60 min  . Stress: Not at all  Relationships  . Social connections:    Talks on phone: Patient refused    Gets together: Patient refused    Attends religious service: Patient refused    Active member of club or organization: Patient refused    Attends meetings of clubs or organizations: Patient refused    Relationship status: Married  . Intimate partner violence:    Fear of current or ex  partner: No    Emotionally abused: No    Physically abused: No    Forced sexual activity: No  Other Topics Concern  . Not on file  Social History Narrative  . Not on file    Allergies  Allergen Reactions  . Choline Fenofibrate Other (See Comments)    Roaring in ears  . Codeine Other (See Comments)    Unknown  . Codeine Sulfate Other (See Comments)  . Ezetimibe Other (See Comments)    Roaring in ears  . Fenofibrate Other (See Comments)    Roaring in ears  . Fenofibric Acid Other (See Comments)    Roaring in ears  . Gemfibrozil Other (See Comments)    Outpatient Medications Prior to Visit  Medication Sig Dispense Refill  . albuterol (PROVENTIL HFA;VENTOLIN HFA) 108 (90 Base) MCG/ACT inhaler Inhale 2 puffs into the lungs every 6 (six) hours as needed for wheezing or shortness of breath. 1  Inhaler 11  . allopurinol (ZYLOPRIM) 100 MG tablet TAKE 1 TABLET(100 MG) BY MOUTH DAILY 90 tablet 3  . Calcium Carbonate-Vitamin D (CALTRATE 600+D PO) Take 1 tablet by mouth daily.    . meclizine (ANTIVERT) 25 MG tablet Take 1 tablet (25 mg total) by mouth 3 (three) times daily as needed for dizziness. 30 tablet 11  . meloxicam (MOBIC) 15 MG tablet TAKE 1 TABLET BY MOUTH DAILY 90 tablet 0  . Red Yeast Rice 600 MG CAPS Take by mouth.    . torsemide (DEMADEX) 10 MG tablet Take 10 mg by mouth as needed (twice a week as needed for ankle swelling). Dr Nehemiah Massed    . triamterene-hydrochlorothiazide (MAXZIDE-25) 37.5-25 MG tablet Take 0.5 tablets by mouth as directed. 3 times qweek- Dr Nehemiah Massed    . cyclobenzaprine (FLEXERIL) 10 MG tablet TAKE 1 TABLET(10 MG) BY MOUTH AT BEDTIME (Patient not taking: Reported on 09/07/2017) 30 tablet 0   Facility-Administered Medications Prior to Visit  Medication Dose Route Frequency Provider Last Rate Last Dose  . albuterol (PROVENTIL) (2.5 MG/3ML) 0.083% nebulizer solution 2.5 mg  2.5 mg Nebulization Once Juline Patch, MD        Review of Systems  Constitutional: Negative for chills, fever, malaise/fatigue and weight loss.  HENT: Negative for ear discharge, ear pain and sore throat.   Eyes: Negative for blurred vision.  Respiratory: Negative for cough, sputum production, shortness of breath and wheezing.   Cardiovascular: Negative for chest pain, palpitations and leg swelling.  Gastrointestinal: Negative for abdominal pain, blood in stool, constipation, diarrhea, heartburn, melena, nausea and vomiting.  Genitourinary: Positive for frequency and urgency. Negative for dysuria, flank pain, hematuria and hesitancy.  Musculoskeletal: Negative for back pain, joint pain, myalgias and neck pain.  Skin: Negative for rash.  Neurological: Negative for dizziness, tingling, sensory change, focal weakness and headaches.  Endo/Heme/Allergies: Negative for environmental  allergies and polydipsia. Does not bruise/bleed easily.  Psychiatric/Behavioral: Negative for depression and suicidal ideas. The patient is not nervous/anxious and does not have insomnia.      Objective  Vitals:   09/07/17 1456  BP: 130/70  Pulse: 72  Weight: 174 lb (78.9 kg)  Height: 5\' 4"  (1.626 m)    Physical Exam  Constitutional: She is oriented to person, place, and time. She appears well-developed and well-nourished.  HENT:  Head: Normocephalic.  Right Ear: External ear normal.  Left Ear: External ear normal.  Mouth/Throat: Oropharynx is clear and moist.  Eyes: Pupils are equal, round, and reactive to light. Conjunctivae and EOM  are normal. Lids are everted and swept, no foreign bodies found. Left eye exhibits no hordeolum. No foreign body present in the left eye. Right conjunctiva is not injected. Left conjunctiva is not injected. No scleral icterus.  Neck: Normal range of motion. Neck supple. No JVD present. No tracheal deviation present. No thyromegaly present.  Cardiovascular: Normal rate, regular rhythm, normal heart sounds and intact distal pulses. Exam reveals no gallop and no friction rub.  No murmur heard. Pulmonary/Chest: Effort normal and breath sounds normal. No respiratory distress. She has no wheezes. She has no rales.  Abdominal: Soft. Bowel sounds are normal. She exhibits no mass. There is no hepatosplenomegaly. There is tenderness in the suprapubic area. There is no rebound, no guarding and no CVA tenderness.  Musculoskeletal: Normal range of motion. She exhibits no edema or tenderness.  Lymphadenopathy:    She has no cervical adenopathy.  Neurological: She is alert and oriented to person, place, and time. She has normal strength. She displays normal reflexes. No cranial nerve deficit.  Skin: Skin is warm. No rash noted.  Psychiatric: She has a normal mood and affect. Her mood appears not anxious. She does not exhibit a depressed mood.  Nursing note and vitals  reviewed.     Assessment & Plan  Problem List Items Addressed This Visit    None    Visit Diagnoses    Cystitis    -  Primary   treat with Keflex   Relevant Medications   cephALEXin (KEFLEX) 500 MG capsule   Other Relevant Orders   POCT Urinalysis Dipstick (Completed)      Meds ordered this encounter  Medications  . cephALEXin (KEFLEX) 500 MG capsule    Sig: Take 1 capsule (500 mg total) by mouth 3 (three) times daily.    Dispense:  9 capsule    Refill:  0      Dr. Otilio Miu [facility]  09/07/17

## 2017-09-09 DIAGNOSIS — I493 Ventricular premature depolarization: Secondary | ICD-10-CM | POA: Diagnosis not present

## 2017-09-09 DIAGNOSIS — I700 Atherosclerosis of aorta: Secondary | ICD-10-CM | POA: Diagnosis not present

## 2017-09-09 DIAGNOSIS — I10 Essential (primary) hypertension: Secondary | ICD-10-CM | POA: Diagnosis not present

## 2017-09-09 DIAGNOSIS — E782 Mixed hyperlipidemia: Secondary | ICD-10-CM | POA: Diagnosis not present

## 2017-09-09 DIAGNOSIS — R600 Localized edema: Secondary | ICD-10-CM | POA: Diagnosis not present

## 2017-09-14 DIAGNOSIS — N329 Bladder disorder, unspecified: Secondary | ICD-10-CM | POA: Diagnosis not present

## 2017-09-14 DIAGNOSIS — N2889 Other specified disorders of kidney and ureter: Secondary | ICD-10-CM | POA: Diagnosis not present

## 2017-09-18 ENCOUNTER — Ambulatory Visit: Payer: Self-pay | Admitting: Urology

## 2017-09-24 ENCOUNTER — Other Ambulatory Visit: Payer: Self-pay | Admitting: Family Medicine

## 2017-09-24 DIAGNOSIS — N2889 Other specified disorders of kidney and ureter: Secondary | ICD-10-CM | POA: Diagnosis not present

## 2017-09-24 DIAGNOSIS — M519 Unspecified thoracic, thoracolumbar and lumbosacral intervertebral disc disorder: Secondary | ICD-10-CM

## 2017-10-09 DIAGNOSIS — M65332 Trigger finger, left middle finger: Secondary | ICD-10-CM | POA: Diagnosis not present

## 2017-10-26 DIAGNOSIS — M65332 Trigger finger, left middle finger: Secondary | ICD-10-CM | POA: Diagnosis not present

## 2017-10-30 ENCOUNTER — Ambulatory Visit (INDEPENDENT_AMBULATORY_CARE_PROVIDER_SITE_OTHER): Payer: Medicare Other

## 2017-10-30 DIAGNOSIS — Z23 Encounter for immunization: Secondary | ICD-10-CM | POA: Diagnosis not present

## 2017-11-25 DIAGNOSIS — L570 Actinic keratosis: Secondary | ICD-10-CM | POA: Diagnosis not present

## 2017-11-25 DIAGNOSIS — Z86018 Personal history of other benign neoplasm: Secondary | ICD-10-CM | POA: Diagnosis not present

## 2017-11-25 DIAGNOSIS — Z859 Personal history of malignant neoplasm, unspecified: Secondary | ICD-10-CM | POA: Diagnosis not present

## 2017-11-25 DIAGNOSIS — Z85820 Personal history of malignant melanoma of skin: Secondary | ICD-10-CM | POA: Diagnosis not present

## 2017-11-25 DIAGNOSIS — Z1283 Encounter for screening for malignant neoplasm of skin: Secondary | ICD-10-CM | POA: Diagnosis not present

## 2017-12-23 DIAGNOSIS — H43812 Vitreous degeneration, left eye: Secondary | ICD-10-CM | POA: Diagnosis not present

## 2017-12-23 DIAGNOSIS — H2513 Age-related nuclear cataract, bilateral: Secondary | ICD-10-CM | POA: Diagnosis not present

## 2017-12-28 ENCOUNTER — Ambulatory Visit: Payer: Medicare Other | Admitting: Family Medicine

## 2017-12-28 ENCOUNTER — Encounter: Payer: Self-pay | Admitting: Family Medicine

## 2017-12-28 VITALS — BP 130/70 | HR 78 | Ht 64.0 in | Wt 180.0 lb

## 2017-12-28 DIAGNOSIS — E7439 Other disorders of intestinal carbohydrate absorption: Secondary | ICD-10-CM

## 2017-12-28 DIAGNOSIS — I8002 Phlebitis and thrombophlebitis of superficial vessels of left lower extremity: Secondary | ICD-10-CM | POA: Diagnosis not present

## 2017-12-28 NOTE — Progress Notes (Signed)
Date:  12/28/2017   Name:  Kiara Becker   DOB:  [birth year]   MRN:  [id number]   Chief Complaint: Leg Pain (left leg has a sore place on it- came up after wearing husband's socks) Leg Pain   The incident occurred 2 days ago (Saturday). There was no injury mechanism. The pain is present in the left leg. The quality of the pain is described as aching. The pain is moderate. The pain has been constant since onset. Pertinent negatives include no inability to bear weight, loss of motion, loss of sensation, muscle weakness, numbness or tingling. She reports no foreign bodies present. She has tried elevation for the symptoms. The treatment provided no relief.     Review of Systems  Constitutional: Negative.  Negative for chills, fatigue, fever and unexpected weight change.  HENT: Negative for congestion, ear discharge, ear pain, rhinorrhea, sinus pressure, sneezing and sore throat.   Eyes: Negative for photophobia, pain, discharge, redness and itching.  Respiratory: Negative for cough, shortness of breath, wheezing and stridor.   Gastrointestinal: Negative for abdominal pain, blood in stool, constipation, diarrhea, nausea and vomiting.  Endocrine: Negative for cold intolerance, heat intolerance, polydipsia, polyphagia and polyuria.  Genitourinary: Negative for dysuria, flank pain, frequency, hematuria, menstrual problem, pelvic pain, urgency, vaginal bleeding and vaginal discharge.  Musculoskeletal: Negative for arthralgias, back pain and myalgias.  Skin: Negative for rash.  Allergic/Immunologic: Negative for environmental allergies and food allergies.  Neurological: Negative for dizziness, tingling, weakness, light-headedness, numbness and headaches.  Hematological: Negative for adenopathy. Does not bruise/bleed easily.  Psychiatric/Behavioral: Negative for dysphoric mood. The patient is not nervous/anxious.     Patient Active Problem List   Diagnosis Date Noted  . Reactive airway disease  08/11/2016  . Benign paroxysmal positional vertigo 08/11/2016  . Moderate asthma with acute exacerbation 11/05/2015  . Bronchiolitis 08/14/2014  . Medicare annual wellness visit, subsequent 07/24/2014  . Adiposity 07/24/2014  . Awareness of heartbeats 07/14/2014  . Beat, premature ventricular 07/14/2014  . Essential (primary) hypertension 06/16/2014  . Mixed hyperlipidemia 06/16/2014  . Frank hematuria 09/06/2013  . Kidney lump 09/06/2013  . Urge incontinence 09/06/2013    Allergies  Allergen Reactions  . Choline Fenofibrate Other (See Comments)    Roaring in ears  . Codeine Other (See Comments)    Unknown  . Codeine Sulfate Other (See Comments)  . Ezetimibe Other (See Comments)    Roaring in ears  . Fenofibrate Other (See Comments)    Roaring in ears  . Fenofibric Acid Other (See Comments)    Roaring in ears  . Gemfibrozil Other (See Comments)    Past Surgical History:  Procedure Laterality Date  . CHOLECYSTECTOMY  11/05/2010   Dr. Pat Becker  . COLECTOMY  10/13/2007   Laparoscopic assisting ascending colectomy  . COLON SURGERY  2009   remove a mass  . COLONOSCOPY  03/2014   normal- cleared for 5 years- Dr Kiara Becker  . GALLBLADDER SURGERY    . HERNIA REPAIR    . KNEE SURGERY Bilateral    menisacal repair and then replacement  . SKIN SURGERY     removal melanoma on arm and eye  . VAGINAL HYSTERECTOMY  1984    Social History   Tobacco Use  . Smoking status: Never Smoker  . Smokeless tobacco: Never Used  . Tobacco comment: smoking cessation materials not required  Substance Use Topics  . Alcohol use: No    Alcohol/week: 0.0 standard drinks  . Drug use:  No     Medication list has been reviewed and updated.  Current Meds  Medication Sig  . albuterol (PROVENTIL HFA;VENTOLIN HFA) 108 (90 Base) MCG/ACT inhaler Inhale 2 puffs into the lungs every 6 (six) hours as needed for wheezing or shortness of breath.  . allopurinol (ZYLOPRIM) 100 MG tablet TAKE 1 TABLET(100 MG)  BY MOUTH DAILY  . Calcium Carbonate-Vitamin D (CALTRATE 600+D PO) Take 1 tablet by mouth daily.  . cyclobenzaprine (FLEXERIL) 10 MG tablet TAKE 1 TABLET(10 MG) BY MOUTH AT BEDTIME  . meclizine (ANTIVERT) 25 MG tablet Take 1 tablet (25 mg total) by mouth 3 (three) times daily as needed for dizziness.  . meloxicam (MOBIC) 15 MG tablet TAKE 1 TABLET BY MOUTH DAILY  . Red Yeast Rice 600 MG CAPS Take by mouth.  . torsemide (DEMADEX) 10 MG tablet Take 10 mg by mouth as needed (twice a week as needed for ankle swelling). Dr Kiara Becker  . triamterene-hydrochlorothiazide (MAXZIDE-25) 37.5-25 MG tablet Take 0.5 tablets by mouth as directed. 3 times qweek- Dr Kiara Becker   Current Facility-Administered Medications for the 12/28/17 encounter (Office Visit) with Juline Patch, MD  Medication  . albuterol (PROVENTIL) (2.5 MG/3ML) 0.083% nebulizer solution 2.5 mg    PHQ 2/9 Scores 08/05/2017 08/04/2016 12/20/2015 07/25/2015  PHQ - 2 Score 0 0 0 0  PHQ- 9 Score 0 - - -    Physical Exam  Constitutional: No distress.  HENT:  Head: Normocephalic and atraumatic.  Right Ear: External ear normal.  Left Ear: External ear normal.  Nose: Nose normal.  Mouth/Throat: Oropharynx is clear and moist.  Eyes: Pupils are equal, round, and reactive to light. Conjunctivae and EOM are normal. Right eye exhibits no discharge. Left eye exhibits no discharge.  Neck: Normal range of motion. Neck supple. No JVD present. No thyromegaly present.  Cardiovascular: Normal rate, regular rhythm, S1 normal, S2 normal, normal heart sounds and intact distal pulses. Exam reveals no gallop, no S3, no S4, no distant heart sounds and no friction rub.  No murmur heard. Superficial varicose veins tenderness with erythema medial aspect left lower les  Pulmonary/Chest: Effort normal and breath sounds normal.  Abdominal: Soft. Bowel sounds are normal. She exhibits no mass. There is no tenderness. There is no guarding.  Musculoskeletal: Normal range  of motion. She exhibits no edema.  Lymphadenopathy:    She has no cervical adenopathy.  Neurological: She is alert. She has normal reflexes.  Skin: Skin is warm and dry. She is not diaphoretic.  Nursing note and vitals reviewed.   BP 130/70   Pulse 78   Ht 5\' 4"  (1.626 m)   Wt 180 lb (81.6 kg)   BMI 30.90 kg/m   Assessment and Plan:  1. Thrombophlebitis of superficial veins of left lower extremity Referral to vein and vascular - Ambulatory referral to Vascular Surgery  2. Glucose intolerance Draw an A1C due to glucose intolerance in the past - Hemoglobin A1c   Dr. [facility]  12/28/2017

## 2017-12-28 NOTE — Patient Instructions (Signed)
Deep Vein Thrombosis Deep vein thrombosis (DVT) is a condition in which a blood clot forms in a deep vein, such as a lower leg, thigh, or arm vein. A clot is blood that has thickened into a gel or solid. This condition is dangerous. It can lead to serious and even life-threatening complications if the clot travels to the lungs and causes a blockage (pulmonary embolism). It can also damage veins in the leg. This can result in leg pain, swelling, discoloration, and sores (post-thrombotic syndrome). What are the causes? This condition may be caused by:  A slowdown of blood flow.  Damage to a vein.  A condition that makes blood clot more easily.  What increases the risk? The following factors may make you more likely to develop this condition:  Being overweight.  Being elderly, especially over age 60.  Sitting or lying down for more than four hours.  Lack of physical activity (sedentary lifestyle).  Being pregnant, giving birth, or having recently given birth.  Taking medicines that contain estrogen.  Smoking.  A history of any of the following: ? Blood clots or blood clotting disease. ? Peripheral vascular disease. ? Inflammatory bowel disease. ? Cancer. ? Heart disease. ? Genetic conditions that affect how blood clots. ? Neurological diseases that affect the legs (leg paresis). ? Injury. ? Major or lengthy surgery. ? A central line placed inside a large vein.  What are the signs or symptoms? Symptoms of this condition include:  Swelling, pain, or tenderness in an arm or leg.  Warmth, redness, or discoloration in an arm or leg.  If the clot is in your leg, symptoms may be more noticeable or worse when you stand or walk. Some people do not have any symptoms. How is this diagnosed? This condition is diagnosed with:  A medical history.  A physical exam.  Tests, such as: ? Blood tests. These are done to see how your blood clots. ? Imaging tests. These are done to  check for clots. Tests may include:  Ultrasound.  CT scan.  MRI.  X-ray.  Venogram. For this test, X-rays are taken after a dye is injected into a vein.  How is this treated? Treatment for this condition depends on the cause, your risk for bleeding or developing more clots, and any medical conditions you have. Treatment may include:  Taking blood thinners (also called anticoagulants). These medicines may be taken by mouth, injected under the skin, or injected through an IV tube (catheter). These medicines prevent clots from forming.  Injecting medicine that dissolves blood clots into the affected vein (catheter-directed thrombolysis).  Having surgery. Surgery may be done to: ? Remove the clot. ? Place a filter in a large vein to catch blood clots before they reach the lungs.  Some treatments may be continued for up to six months. Follow these instructions at home: If you are taking an oral blood thinner:  Take the medicine exactly as told by your health care provider. Some blood thinners need to be taken at the same time every day. Do not skip a dose.  Ask your health care provider about what foods and drugs interact with the medicine.  Ask about possible side effects. General instructions  Blood thinners can cause easy bruising and difficulty stopping bleeding. Because of this, if you are taking or were given a blood thinner: ? Hold pressure over cuts for longer than usual. ? Tell your dentist and other health care providers that you are taking blood thinners before   having any procedures that can cause bleeding. ? Avoid contact sports.  Take over-the-counter and prescription medicines only as told by your health care provider.  Return to your normal activities as told by your health care provider. Ask your health care provider what activities are safe for you.  Wear compression stockings if recommended by your health care provider.  Keep all follow-up visits as told by  your health care provider. This is important. How is this prevented? To lower your risk of developing this condition again:  For 30 or more minutes every day, do an activity that: ? Involves moving your arms and legs. ? Increases your heart rate.  When traveling for longer than four hours: ? Exercise your arms and legs every hour. ? Drink plenty of water. ? Avoid drinking alcohol.  Avoid sitting or lying for a long time without moving your legs.  Stay a healthy weight.  If you are a woman who is older than age 35, avoid unnecessary use of medicines that contain estrogen.  Do not use any products that contain nicotine or tobacco, such as cigarettes and e-cigarettes. This is especially important if you take estrogen medicines. If you need help quitting, ask your health care provider.  Contact a health care provider if:  You miss a dose of your blood thinner.  You have nausea, vomiting, or diarrhea that lasts for more than one day.  Your menstrual period is heavier than usual.  You have unusual bruising. Get help right away if:  You have new or increased pain, swelling, or redness in an arm or leg.  You have numbness or tingling in an arm or leg.  You have shortness of breath.  You have chest pain.  You have a rapid or irregular heartbeat.  You feel light-headed or dizzy.  You cough up blood.  There is blood in your vomit, stool, or urine.  You have a serious fall or accident, or you hit your head.  You have a severe headache or confusion.  You have a cut that will not stop bleeding. These symptoms may represent a serious problem that is an emergency. Do not wait to see if the symptoms will go away. Get medical help right away. Call your local emergency services (911 in the [place] Do not drive yourself to the hospital. Summary  DVT is a condition in which a blood clot forms in a deep vein, such as a lower leg, thigh, or arm vein.  Symptoms can include swelling,  warmth, pain, and redness in your leg or arm.  Treatment may include taking blood thinners, injecting medicine that dissolves blood clots,wearing compression stockings, or surgery.  If you are prescribed blood thinners, take them exactly as told. This information is not intended to replace advice given to you by your health care provider. Make sure you discuss any questions you have with your health care provider. Document Released: 01/20/2005 Document Revised: 02/23/2016 Document Reviewed: 02/23/2016 Elsevier Interactive Patient Education  2018 [employer]  

## 2017-12-29 LAB — HEMOGLOBIN A1C
ESTIMATED AVERAGE GLUCOSE: 117 mg/dL
Hgb A1c MFr Bld: 5.7 % — ABNORMAL HIGH (ref 4.8–5.6)

## 2018-01-07 ENCOUNTER — Encounter (INDEPENDENT_AMBULATORY_CARE_PROVIDER_SITE_OTHER): Payer: Self-pay | Admitting: Nurse Practitioner

## 2018-01-07 ENCOUNTER — Ambulatory Visit (INDEPENDENT_AMBULATORY_CARE_PROVIDER_SITE_OTHER): Payer: Medicare Other | Admitting: Nurse Practitioner

## 2018-01-07 VITALS — BP 147/81 | HR 70 | Resp 16 | Ht 64.0 in | Wt 182.0 lb

## 2018-01-07 DIAGNOSIS — I10 Essential (primary) hypertension: Secondary | ICD-10-CM

## 2018-01-07 DIAGNOSIS — E782 Mixed hyperlipidemia: Secondary | ICD-10-CM | POA: Diagnosis not present

## 2018-01-07 DIAGNOSIS — I83893 Varicose veins of bilateral lower extremities with other complications: Secondary | ICD-10-CM | POA: Diagnosis not present

## 2018-01-07 NOTE — Progress Notes (Signed)
Subjective:    Patient ID: Kiara Becker, female    DOB: 01/20/1946, 72 y.o.   MRN: [id number] Chief Complaint  Patient presents with  . New Patient (Initial Visit)    ref Ronnald Ramp for thrombophlebitis of superficial veins of left le    HPI  Kiara Becker is a 72 y.o. female that is referred by Dr. Ronnald Ramp following an episode of superficial thrombophlebitis.  The patient describes having a procedure that sounds somewhat like sclerotherapy, however she is unsure if that is what it is.  She is also unsure of what leg it was on.  She states that this was approximately 20 to 22 years ago. The patient relates burning and stinging which worsened steadily throughout the course of the day, particularly with standing. The patient also notes an aching and throbbing pain over the varicosities, particularly with prolonged dependent positions. The symptoms are significantly improved with elevation.  The patient also notes that during hot weather the symptoms are greatly intensified. The patient states the pain from the varicose veins interferes with work, daily exercise, shopping and household maintenance. At this point, the symptoms are persistent and severe enough that they're having a negative impact on lifestyle and are interfering with daily activities.  There is no history of DVT, PE or superficial thrombophlebitis. There is no history of ulceration or hemorrhage. The patient denies a significant family history of varicose veins.  The patient has not worn graduated compression in the past. At the present time the patient has not been using over-the-counter analgesics. There is no history of prior surgical intervention.   Past Medical History:  Diagnosis Date  . Allergy   . Asthma   . Cancer ([place]) 2001   melanoma  . Colon cancer ([place]) 2009  . Hyperlipidemia   . Hypertension   . Ventral hernia     Past Surgical History:  Procedure Laterality Date  . CHOLECYSTECTOMY  11/05/2010   Dr. Pat Patrick  .  COLECTOMY  10/13/2007   Laparoscopic assisting ascending colectomy  . COLON SURGERY  2009   remove a mass  . COLONOSCOPY  03/2014   normal- cleared for 5 years- Dr Vira Agar  . GALLBLADDER SURGERY    . HERNIA REPAIR    . KNEE SURGERY Bilateral    menisacal repair and then replacement  . SKIN SURGERY     removal melanoma on arm and eye  . VAGINAL HYSTERECTOMY  1984    Social History   Socioeconomic History  . Marital status: Married    Spouse name: Not on file  . Number of children: 2  . Years of education: some college  . Highest education level: 12th grade  Occupational History  . Occupation: Retired  Scientific laboratory technician  . Financial resource strain: Not hard at all  . Food insecurity:    Worry: Never true    Inability: Never true  . Transportation needs:    Medical: No    Non-medical: No  Tobacco Use  . Smoking status: Never Smoker  . Smokeless tobacco: Never Used  . Tobacco comment: smoking cessation materials not required  Substance and Sexual Activity  . Alcohol use: No    Alcohol/week: 0.0 standard drinks  . Drug use: No  . Sexual activity: Yes    Birth control/protection: None  Lifestyle  . Physical activity:    Days per week: 6 days    Minutes per session: 60 min  . Stress: Not at all  Relationships  . Social  connections:    Talks on phone: Patient refused    Gets together: Patient refused    Attends religious service: Patient refused    Active member of club or organization: Patient refused    Attends meetings of clubs or organizations: Patient refused    Relationship status: Married  . Intimate partner violence:    Fear of current or ex partner: No    Emotionally abused: No    Physically abused: No    Forced sexual activity: No  Other Topics Concern  . Not on file  Social History Narrative  . Not on file    Family History  Problem Relation Age of Onset  . Heart disease Mother   . Liver cancer Mother   . Heart disease Father   . Heart disease  Brother   . Drug abuse Maternal Grandfather     Allergies  Allergen Reactions  . Choline Fenofibrate Other (See Comments)    Roaring in ears  . Codeine Other (See Comments)    Unknown  . Codeine Sulfate Other (See Comments)  . Ezetimibe Other (See Comments)    Roaring in ears  . Fenofibrate Other (See Comments)    Roaring in ears  . Fenofibric Acid Other (See Comments)    Roaring in ears  . Gemfibrozil Other (See Comments)     Review of Systems   Review of Systems: Negative Unless Checked Constitutional: [] Weight loss  [] Fever  [] Chills Cardiac: [] Chest pain   []  Atrial Fibrillation  [] Palpitations   [] Shortness of breath when laying flat   [] Shortness of breath with exertion. Vascular:  [] Pain in legs with walking   [] Pain in legs with standing  [] History of DVT   [x] Phlebitis   [x] Swelling in legs   [x] Varicose veins   [] Non-healing ulcers Pulmonary:   [] Uses home oxygen   [] Productive cough   [] Hemoptysis   [] Wheeze  [] COPD   [x] Asthma Neurologic:  [x] Dizziness   [] Seizures   [] History of stroke   [] History of TIA  [] Aphasia   [] Vissual changes   [] Weakness or numbness in arm   [] Weakness or numbness in leg Musculoskeletal:   [] Joint swelling   [] Joint pain   [] Low back pain  []  History of Knee Replacement Hematologic:  [] Easy bruising  [] Easy bleeding   [] Hypercoagulable state   [] Anemic Gastrointestinal:  [] Diarrhea   [] Vomiting  [] Gastroesophageal reflux/heartburn   [] Difficulty swallowing. Genitourinary:  [] Chronic kidney disease   [] Difficult urination  [] Anuric   [] Blood in urine Skin:  [x] Rashes   [] Ulcers  Psychological:  [] History of anxiety   []  History of major depression  []  Memory Difficulties     Objective:   Physical Exam  BP (!) 147/81 (BP Location: Right Arm)   Pulse 70   Resp 16   Ht 5\' 4"  (1.626 m)   Wt 182 lb (82.6 kg)   BMI 31.24 kg/m   Gen: WD/WN, NAD Head: [place]/AT, No temporalis wasting.  Ear/Nose/Throat: Hearing grossly intact, nares w/o  erythema or drainage Eyes: PER, EOMI, sclera nonicteric.  Neck: Supple, no masses.  No JVD.  Pulmonary:  Good air movement, no use of accessory muscles.  Cardiac: RRR Vascular: 2+ soft edema, scattered spider varicosities bilaterally, large varicose vein approximately 3 cm extending from the level of the groin down to the level of the knee Vessel Right Left  Radial Palpable Palpable  Dorsalis Pedis Palpable Palpable  Posterior Tibial Palpable Palpable   Gastrointestinal: soft, non-distended. No guarding/no peritoneal signs.  Musculoskeletal: M/S  5/5 throughout.  No deformity or atrophy.  Neurologic: Pain and light touch intact in extremities.  Symmetrical.  Speech is fluent. Motor exam as listed above. Psychiatric: Judgment intact, Mood & affect appropriate for pt's clinical situation. Dermatologic:  Mild stasis dermatitis. No Ulcers Noted.  No changes consistent with cellulitis. Lymph : No Cervical lymphadenopathy, no lichenification or skin changes of chronic lymphedema.      Assessment & Plan:   1. Varicose veins of bilateral lower extremities with other complications  Recommend:  The patient has large symptomatic varicose veins that are painful and associated with swelling.  I have had a long discussion with the patient regarding  varicose veins and why they cause symptoms.  Patient will begin wearing graduated compression stockings class 1 on a daily basis, beginning first thing in the morning and removing them in the evening. The patient is instructed specifically not to sleep in the stockings.    The patient  will also begin using over-the-counter analgesics such as Motrin 600 mg po TID to help control the symptoms.    In addition, behavioral modification including elevation during the day will be initiated.    Pending the results of these changes the  patient will be reevaluated in three months.   An  ultrasound of the venous system will be obtained.   Further plans will be  based on the ultrasound results and whether conservative therapies are successful at eliminating the pain and swelling.  - VAS [place] LOWER EXTREMITY VENOUS REFLUX; Future  2. Mixed hyperlipidemia Continue statin as ordered and reviewed, no changes at this time   3. Essential (primary) hypertension Continue antihypertensive medications as already ordered, these medications have been reviewed and there are no changes at this time.    Current Outpatient Medications on File Prior to Visit  Medication Sig Dispense Refill  . albuterol (PROVENTIL HFA;VENTOLIN HFA) 108 (90 Base) MCG/ACT inhaler Inhale 2 puffs into the lungs every 6 (six) hours as needed for wheezing or shortness of breath. 1 Inhaler 11  . allopurinol (ZYLOPRIM) 100 MG tablet TAKE 1 TABLET(100 MG) BY MOUTH DAILY 90 tablet 3  . Calcium Carbonate-Vitamin D (CALTRATE 600+D PO) Take 1 tablet by mouth daily.    . cyclobenzaprine (FLEXERIL) 10 MG tablet TAKE 1 TABLET(10 MG) BY MOUTH AT BEDTIME 30 tablet 0  . meclizine (ANTIVERT) 25 MG tablet Take 1 tablet (25 mg total) by mouth 3 (three) times daily as needed for dizziness. 30 tablet 11  . meloxicam (MOBIC) 15 MG tablet TAKE 1 TABLET BY MOUTH DAILY 90 tablet 0  . Red Yeast Rice 600 MG CAPS Take by mouth.    . torsemide (DEMADEX) 10 MG tablet Take 10 mg by mouth as needed (twice a week as needed for ankle swelling). Dr Nehemiah Massed    . triamterene-hydrochlorothiazide (MAXZIDE-25) 37.5-25 MG tablet Take 0.5 tablets by mouth as directed. 3 times qweek- Dr Nehemiah Massed     Current Facility-Administered Medications on File Prior to Visit  Medication Dose Route Frequency Provider Last Rate Last Dose  . albuterol (PROVENTIL) (2.5 MG/3ML) 0.083% nebulizer solution 2.5 mg  2.5 mg Nebulization Once Juline Patch, MD        There are no Patient Instructions on file for this visit. No follow-ups on file.   Kris Hartmann, NP  This note was completed with Sales executive.  Any errors are purely  unintentional.

## 2018-02-01 ENCOUNTER — Ambulatory Visit: Payer: Medicare Other | Admitting: Family Medicine

## 2018-02-01 ENCOUNTER — Encounter: Payer: Self-pay | Admitting: Family Medicine

## 2018-02-01 VITALS — BP 120/70 | HR 76 | Ht 64.0 in | Wt 181.0 lb

## 2018-02-01 DIAGNOSIS — H8309 Labyrinthitis, unspecified ear: Secondary | ICD-10-CM

## 2018-02-01 DIAGNOSIS — J0100 Acute maxillary sinusitis, unspecified: Secondary | ICD-10-CM | POA: Diagnosis not present

## 2018-02-01 DIAGNOSIS — J4520 Mild intermittent asthma, uncomplicated: Secondary | ICD-10-CM | POA: Diagnosis not present

## 2018-02-01 MED ORDER — ALBUTEROL SULFATE HFA 108 (90 BASE) MCG/ACT IN AERS
2.0000 | INHALATION_SPRAY | Freq: Four times a day (QID) | RESPIRATORY_TRACT | 11 refills | Status: DC | PRN
Start: 2018-02-01 — End: 2019-05-17

## 2018-02-01 MED ORDER — AZITHROMYCIN 250 MG PO TABS: ORAL_TABLET | ORAL | 0 refills | Status: DC

## 2018-02-01 MED ORDER — MECLIZINE HCL 25 MG PO TABS: 25.0000 mg | ORAL_TABLET | Freq: Three times a day (TID) | ORAL | 11 refills | Status: DC | PRN

## 2018-02-01 NOTE — Progress Notes (Signed)
Date:  02/01/2018   Name:  Kiara Becker   DOB:  03/29/1945   MRN:  [id number]   Chief Complaint: Sinusitis (loss of voice, sore throat, ear ache- worse in R) ear. Room spinning- taking meclizine for dizziness. ) and Asthma (needs refill on albuterol)  Sinusitis  This is a new problem. The current episode started in the past 7 days (Friday). The problem is unchanged. There has been no fever. The fever has been present for 3 to 4 days. The pain is mild. Associated symptoms include congestion, coughing, ear pain, a hoarse voice, shortness of breath, sinus pressure and a sore throat. Pertinent negatives include no chills, diaphoresis, headaches, neck pain, sneezing or swollen glands. Treatments tried: dayquil/nyquil. The treatment provided mild relief.  Asthma  She complains of cough, hoarse voice, shortness of breath and wheezing. There is no chest tightness, difficulty breathing, frequent throat clearing, hemoptysis or sputum production. This is a recurrent problem. The current episode started more than 1 year ago. The problem occurs intermittently. The cough is non-productive. Associated symptoms include ear pain and a sore throat. Pertinent negatives include no appetite change, chest pain, dyspnea on exertion, ear congestion, fever, headaches, heartburn, malaise/fatigue, myalgias, nasal congestion, orthopnea, PND, postnasal drip, rhinorrhea, sneezing, sweats, trouble swallowing or weight loss. Her past medical history is significant for asthma.    Review of Systems  Constitutional: Negative.  Negative for appetite change, chills, diaphoresis, fatigue, fever, malaise/fatigue, unexpected weight change and weight loss.  HENT: Positive for congestion, ear pain, hoarse voice, sinus pressure and sore throat. Negative for ear discharge, postnasal drip, rhinorrhea, sneezing and trouble swallowing.   Eyes: Negative for photophobia, pain, discharge, redness and itching.  Respiratory: Positive for cough,  shortness of breath and wheezing. Negative for hemoptysis, sputum production and stridor.   Cardiovascular: Negative for chest pain, dyspnea on exertion and PND.  Gastrointestinal: Negative for abdominal pain, blood in stool, constipation, diarrhea, heartburn, nausea and vomiting.  Endocrine: Negative for cold intolerance, heat intolerance, polydipsia, polyphagia and polyuria.  Genitourinary: Negative for dysuria, flank pain, frequency, hematuria, menstrual problem, pelvic pain, urgency, vaginal bleeding and vaginal discharge.  Musculoskeletal: Negative for arthralgias, back pain, myalgias and neck pain.  Skin: Negative for rash.  Allergic/Immunologic: Negative for environmental allergies and food allergies.  Neurological: Negative for dizziness, weakness, light-headedness, numbness and headaches.  Hematological: Negative for adenopathy. Does not bruise/bleed easily.  Psychiatric/Behavioral: Negative for dysphoric mood. The patient is not nervous/anxious.     Patient Active Problem List   Diagnosis Date Noted  . Varicose veins of bilateral lower extremities with other complications 09/38/1829  . Reactive airway disease 08/11/2016  . Benign paroxysmal positional vertigo 08/11/2016  . Moderate asthma with acute exacerbation 11/05/2015  . Bronchiolitis 08/14/2014  . Medicare annual wellness visit, subsequent 07/24/2014  . Adiposity 07/24/2014  . Awareness of heartbeats 07/14/2014  . Beat, premature ventricular 07/14/2014  . Essential (primary) hypertension 06/16/2014  . Mixed hyperlipidemia 06/16/2014  . Frank hematuria 09/06/2013  . Kidney lump 09/06/2013  . Urge incontinence 09/06/2013    Allergies  Allergen Reactions  . Choline Fenofibrate Other (See Comments)    Roaring in ears  . Codeine Other (See Comments)    Unknown  . Codeine Sulfate Other (See Comments)  . Ezetimibe Other (See Comments)    Roaring in ears  . Fenofibrate Other (See Comments)    Roaring in ears  .  Fenofibric Acid Other (See Comments)    Roaring in ears  .  Gemfibrozil Other (See Comments)    Past Surgical History:  Procedure Laterality Date  . CHOLECYSTECTOMY  11/05/2010   Dr. Pat Patrick  . COLECTOMY  10/13/2007   Laparoscopic assisting ascending colectomy  . COLON SURGERY  2009   remove a mass  . COLONOSCOPY  03/2014   normal- cleared for 5 years- Dr Vira Agar  . GALLBLADDER SURGERY    . HERNIA REPAIR    . KNEE SURGERY Bilateral    menisacal repair and then replacement  . SKIN SURGERY     removal melanoma on arm and eye  . VAGINAL HYSTERECTOMY  1984    Social History   Tobacco Use  . Smoking status: Never Smoker  . Smokeless tobacco: Never Used  . Tobacco comment: smoking cessation materials not required  Substance Use Topics  . Alcohol use: No    Alcohol/week: 0.0 standard drinks  . Drug use: No     Medication list has been reviewed and updated.  Current Meds  Medication Sig  . albuterol (PROVENTIL HFA;VENTOLIN HFA) 108 (90 Base) MCG/ACT inhaler Inhale 2 puffs into the lungs every 6 (six) hours as needed for wheezing or shortness of breath.  . allopurinol (ZYLOPRIM) 100 MG tablet TAKE 1 TABLET(100 MG) BY MOUTH DAILY  . Calcium Carbonate-Vitamin D (CALTRATE 600+D PO) Take 1 tablet by mouth daily.  . cyclobenzaprine (FLEXERIL) 10 MG tablet TAKE 1 TABLET(10 MG) BY MOUTH AT BEDTIME  . meclizine (ANTIVERT) 25 MG tablet Take 1 tablet (25 mg total) by mouth 3 (three) times daily as needed for dizziness.  . meloxicam (MOBIC) 15 MG tablet TAKE 1 TABLET BY MOUTH DAILY  . Red Yeast Rice 600 MG CAPS Take by mouth.  . torsemide (DEMADEX) 10 MG tablet Take 10 mg by mouth as needed (twice a week as needed for ankle swelling). Dr Nehemiah Massed  . triamterene-hydrochlorothiazide (MAXZIDE-25) 37.5-25 MG tablet Take 0.5 tablets by mouth as directed. 3 times qweek- Dr Nehemiah Massed   Current Facility-Administered Medications for the 02/01/18 encounter (Office Visit) with Juline Patch, MD    Medication  . albuterol (PROVENTIL) (2.5 MG/3ML) 0.083% nebulizer solution 2.5 mg    PHQ 2/9 Scores 08/05/2017 08/04/2016 12/20/2015 07/25/2015  PHQ - 2 Score 0 0 0 0  PHQ- 9 Score 0 - - -    Physical Exam Vitals signs and nursing note reviewed.  Constitutional:      General: She is not in acute distress.    Appearance: She is not diaphoretic.  HENT:     Head: Normocephalic and atraumatic.     Right Ear: Ear canal and external ear normal. Decreased hearing noted. Tympanic membrane is retracted.     Left Ear: Ear canal and external ear normal. Decreased hearing noted. Tympanic membrane is retracted.     Nose: Nose normal.     Right Turbinates: Not enlarged.     Left Turbinates: Not enlarged.     Right Sinus: No maxillary sinus tenderness or frontal sinus tenderness.     Left Sinus: No maxillary sinus tenderness or frontal sinus tenderness.     Mouth/Throat:     Lips: Pink.     Pharynx: No pharyngeal swelling, oropharyngeal exudate or posterior oropharyngeal erythema.  Eyes:     General:        Right eye: No discharge.        Left eye: No discharge.     Conjunctiva/sclera: Conjunctivae normal.     Pupils: Pupils are equal, round, and reactive to light.  Neck:  Musculoskeletal: Normal range of motion and neck supple.     Thyroid: No thyromegaly.     Vascular: No JVD.  Cardiovascular:     Rate and Rhythm: Normal rate and regular rhythm.     Heart sounds: Normal heart sounds. No murmur. No friction rub. No gallop.   Pulmonary:     Effort: Pulmonary effort is normal.     Breath sounds: Normal breath sounds.  Abdominal:     General: Bowel sounds are normal.     Palpations: Abdomen is soft. There is no mass.     Tenderness: There is no abdominal tenderness. There is no guarding.  Musculoskeletal: Normal range of motion.  Lymphadenopathy:     Cervical: No cervical adenopathy.  Skin:    General: Skin is warm and dry.  Neurological:     Mental Status: She is alert.     Deep  Tendon Reflexes: Reflexes are normal and symmetric.     BP 120/70   Pulse 76   Ht 5\' 4"  (1.626 m)   Wt 181 lb (82.1 kg)   BMI 31.07 kg/m   Assessment and Plan:  1. Reactive airway disease, mild intermittent, uncomplicated Mild intermittent controlled continue albuterol inhaler 2 puffs every 6 hours as needed wheezing. - albuterol (PROVENTIL HFA;VENTOLIN HFA) 108 (90 Base) MCG/ACT inhaler; Inhale 2 puffs into the lungs every 6 (six) hours as needed for wheezing or shortness of breath.  Dispense: 1 Inhaler; Refill: 11  2. Labyrinthitis, unspecified laterality And has vertiginous symptoms with her upper respiratory circumstance.  Will do meclizine 25 mg as needed as needed vertigo - meclizine (ANTIVERT) 25 MG tablet; Take 1 tablet (25 mg total) by mouth 3 (three) times daily as needed for dizziness.  Dispense: 30 tablet; Refill: 11  3. Acute maxillary sinusitis, recurrence not specified Acute.  Will initiate azithromycin 250 mg 2 today then 1 a day for 4 days. - azithromycin (ZITHROMAX) 250 MG tablet; 2 today then 1 a day for 4 days  Dispense: 6 tablet; Refill: 0

## 2018-02-11 ENCOUNTER — Other Ambulatory Visit: Payer: Self-pay

## 2018-02-11 DIAGNOSIS — J0100 Acute maxillary sinusitis, unspecified: Secondary | ICD-10-CM

## 2018-02-11 MED ORDER — AMOXICILLIN-POT CLAVULANATE 875-125 MG PO TABS: 1.0000 | ORAL_TABLET | Freq: Two times a day (BID) | ORAL | 0 refills | Status: DC

## 2018-02-11 NOTE — Progress Notes (Unsigned)
Pt not completely well- sent in Augmentin

## 2018-04-09 ENCOUNTER — Other Ambulatory Visit: Payer: Self-pay

## 2018-04-09 ENCOUNTER — Encounter (INDEPENDENT_AMBULATORY_CARE_PROVIDER_SITE_OTHER): Payer: Self-pay | Admitting: Nurse Practitioner

## 2018-04-09 ENCOUNTER — Ambulatory Visit (INDEPENDENT_AMBULATORY_CARE_PROVIDER_SITE_OTHER): Payer: Medicare Other

## 2018-04-09 ENCOUNTER — Encounter (INDEPENDENT_AMBULATORY_CARE_PROVIDER_SITE_OTHER): Payer: Self-pay

## 2018-04-09 ENCOUNTER — Ambulatory Visit (INDEPENDENT_AMBULATORY_CARE_PROVIDER_SITE_OTHER): Payer: Medicare Other | Admitting: Nurse Practitioner

## 2018-04-09 VITALS — BP 135/78 | HR 71 | Resp 16 | Ht 64.0 in | Wt 184.0 lb

## 2018-04-09 DIAGNOSIS — E782 Mixed hyperlipidemia: Secondary | ICD-10-CM | POA: Diagnosis not present

## 2018-04-09 DIAGNOSIS — I83893 Varicose veins of bilateral lower extremities with other complications: Secondary | ICD-10-CM

## 2018-04-09 DIAGNOSIS — I10 Essential (primary) hypertension: Secondary | ICD-10-CM

## 2018-04-09 DIAGNOSIS — Z79899 Other long term (current) drug therapy: Secondary | ICD-10-CM | POA: Diagnosis not present

## 2018-04-09 NOTE — Progress Notes (Signed)
SUBJECTIVE:  Patient ID: Kiara Becker, female    DOB: 12-17-1945, 73 y.o.   MRN: [id number] Chief Complaint  Patient presents with  . Follow-up    ultrasound    HPI  Kiara Becker is a 73 y.o. female The patient returns for followup evaluation 3 months after the initial visit. The patient continues to have pain in the lower extremities with dependency. The pain is lessened with elevation. Graduated compression stockings, Class I (20-30 mmHg), have been worn but the stockings do not eliminate the leg pain. Over-the-counter analgesics do not improve the symptoms. The degree of discomfort continues to interfere with daily activities. The patient notes the pain in the legs is causing problems with daily exercise, at the workplace and even with household activities and maintenance such as standing in the kitchen preparing meals and doing dishes.   Venous ultrasound shows normal deep venous system, no evidence of acute or chronic DVT.  Superficial reflux is present in the right great saphenous vein.  The left lower extremity has reflux in the common femoral vein and accessory saphenous vein from its knee.  Past Medical History:  Diagnosis Date  . Allergy   . Asthma   . Cancer ([place]) 2001   melanoma  . Colon cancer ([place]) 2009  . Hyperlipidemia   . Hypertension   . Ventral hernia     Past Surgical History:  Procedure Laterality Date  . CHOLECYSTECTOMY  11/05/2010   Dr. Pat Patrick  . COLECTOMY  10/13/2007   Laparoscopic assisting ascending colectomy  . COLON SURGERY  2009   remove a mass  . COLONOSCOPY  03/2014   normal- cleared for 5 years- Dr Vira Agar  . GALLBLADDER SURGERY    . HERNIA REPAIR    . KNEE SURGERY Bilateral    menisacal repair and then replacement  . SKIN SURGERY     removal melanoma on arm and eye  . VAGINAL HYSTERECTOMY  1984    Social History   Socioeconomic History  . Marital status: Married    Spouse name: Not on file  . Number of children: 2  . Years of  education: some college  . Highest education level: 12th grade  Occupational History  . Occupation: Retired  Scientific laboratory technician  . Financial resource strain: Not hard at all  . Food insecurity:    Worry: Never true    Inability: Never true  . Transportation needs:    Medical: No    Non-medical: No  Tobacco Use  . Smoking status: Never Smoker  . Smokeless tobacco: Never Used  . Tobacco comment: smoking cessation materials not required  Substance and Sexual Activity  . Alcohol use: No    Alcohol/week: 0.0 standard drinks  . Drug use: No  . Sexual activity: Yes    Birth control/protection: None  Lifestyle  . Physical activity:    Days per week: 6 days    Minutes per session: 60 min  . Stress: Not at all  Relationships  . Social connections:    Talks on phone: Patient refused    Gets together: Patient refused    Attends religious service: Patient refused    Active member of club or organization: Patient refused    Attends meetings of clubs or organizations: Patient refused    Relationship status: Married  . Intimate partner violence:    Fear of current or ex partner: No    Emotionally abused: No    Physically abused: No    Forced  sexual activity: No  Other Topics Concern  . Not on file  Social History Narrative  . Not on file    Family History  Problem Relation Age of Onset  . Heart disease Mother   . Liver cancer Mother   . Heart disease Father   . Heart disease Brother   . Drug abuse Maternal Grandfather     Allergies  Allergen Reactions  . Choline Fenofibrate Other (See Comments)    Roaring in ears  . Codeine Other (See Comments)    Unknown  . Codeine Sulfate Other (See Comments)  . Ezetimibe Other (See Comments)    Roaring in ears  . Fenofibrate Other (See Comments)    Roaring in ears  . Fenofibric Acid Other (See Comments)    Roaring in ears  . Gemfibrozil Other (See Comments)     Review of Systems   Review of Systems: Negative Unless  Checked Constitutional: [] Weight loss  [] Fever  [] Chills Cardiac: [] Chest pain   []  Atrial Fibrillation  [] Palpitations   [] Shortness of breath when laying flat   [] Shortness of breath with exertion. [] Shortness of breath at rest Vascular:  [] Pain in legs with walking   [x] Pain in legs with standing [] Pain in legs when laying flat   [] Claudication    [] Pain in feet when laying flat    [] History of DVT   [] Phlebitis   [x] Swelling in legs   [x] Varicose veins   [] Non-healing ulcers Pulmonary:   [] Uses home oxygen   [] Productive cough   [] Hemoptysis   [] Wheeze  [] COPD   [x] Asthma Neurologic:  [x] Dizziness   [] Seizures  [] Blackouts [] History of stroke   [] History of TIA  [] Aphasia   [] Temporary Blindness   [] Weakness or numbness in arm   [] Weakness or numbness in leg Musculoskeletal:   [] Joint swelling   [] Joint pain   [] Low back pain  []  History of Knee Replacement [] Arthritis [] back Surgeries  []  Spinal Stenosis    Hematologic:  [] Easy bruising  [] Easy bleeding   [] Hypercoagulable state   [] Anemic Gastrointestinal:  [] Diarrhea   [] Vomiting  [] Gastroesophageal reflux/heartburn   [] Difficulty swallowing. [] Abdominal pain Genitourinary:  [] Chronic kidney disease   [] Difficult urination  [] Anuric   [] Blood in urine [] Frequent urination  [] Burning with urination   [] Hematuria Skin:  [x] Rashes   [] Ulcers [] Wounds Psychological:  [] History of anxiety   []  History of major depression  []  Memory Difficulties      OBJECTIVE:   Physical Exam  BP 135/78   Pulse 71   Resp 16   Ht 5\' 4"  (1.626 m)   Wt 184 lb (83.5 kg)   BMI 31.58 kg/m   Gen: WD/WN, NAD Head: [place]/AT, No temporalis wasting.  Ear/Nose/Throat: Hearing grossly intact, nares w/o erythema or drainage Eyes: PER, EOMI, sclera nonicteric.  Neck: Supple, no masses.  No JVD.  Pulmonary:  Good air movement, no use of accessory muscles.  Cardiac: RRR Vascular: 2+ soft edema, scattered spider varicosities bilaterally, large varicose vein  approximately 3 cm extending from level of the groin down to the level of the knee.  Vessel Right Left  Radial Palpable Palpable  Dorsalis Pedis Palpable Palpable  Posterior Tibial Palpable Palpable   Gastrointestinal: soft, non-distended. No guarding/no peritoneal signs.  Musculoskeletal: M/S 5/5 throughout.  No deformity or atrophy.  Neurologic: Pain and light touch intact in extremities.  Symmetrical.  Speech is fluent. Motor exam as listed above. Psychiatric: Judgment intact, Mood & affect appropriate for pt's clinical situation. Dermatologic: Mild stasis  dermatitis no Ulcers Noted.  No changes consistent with cellulitis. Lymph : No Cervical lymphadenopathy, no lichenification or skin changes of chronic lymphedema.       ASSESSMENT AND PLAN:  1. Varicose veins of bilateral lower extremities with other complications Recommend  I have reviewed my previous  discussion with the patient regarding  varicose veins and why they cause symptoms. Patient will continue  wearing graduated compression stockings class 1 on a daily basis, beginning first thing in the morning and removing them in the evening.    In addition, behavioral modification including elevation during the day was again discussed and this will continue.  The patient has utilized over the counter pain medications and has been exercising.  However, at this time conservative therapy has not alleviated the patient's symptoms of leg pain and swelling  Recommend: laser ablation of the  left accessory saphenous vein to eliminate the symptoms of pain and swelling of the lower extremities caused by the severe superficial venous reflux disease. If the anatomy is not suited to allow laser ablation, we should proceed with foam sclerotherapy instead.   2. Mixed hyperlipidemia Continue statin as ordered and reviewed, no changes at this time   3. Essential (primary) hypertension Continue antihypertensive medications as already ordered,  these medications have been reviewed and there are no changes at this time.    Current Outpatient Medications on File Prior to Visit  Medication Sig Dispense Refill  . albuterol (PROVENTIL HFA;VENTOLIN HFA) 108 (90 Base) MCG/ACT inhaler Inhale 2 puffs into the lungs every 6 (six) hours as needed for wheezing or shortness of breath. 1 Inhaler 11  . allopurinol (ZYLOPRIM) 100 MG tablet TAKE 1 TABLET(100 MG) BY MOUTH DAILY 90 tablet 3  . cyclobenzaprine (FLEXERIL) 10 MG tablet TAKE 1 TABLET(10 MG) BY MOUTH AT BEDTIME 30 tablet 0  . meclizine (ANTIVERT) 25 MG tablet Take 1 tablet (25 mg total) by mouth 3 (three) times daily as needed for dizziness. 30 tablet 11  . meloxicam (MOBIC) 15 MG tablet TAKE 1 TABLET BY MOUTH DAILY 90 tablet 0  . Red Yeast Rice 600 MG CAPS Take by mouth.    . torsemide (DEMADEX) 10 MG tablet Take 10 mg by mouth as needed (twice a week as needed for ankle swelling). Dr Nehemiah Massed    . triamterene-hydrochlorothiazide (MAXZIDE-25) 37.5-25 MG tablet Take 0.5 tablets by mouth as directed. 3 times qweek- Dr Nehemiah Massed    . amoxicillin-clavulanate (AUGMENTIN) 875-125 MG tablet Take 1 tablet by mouth 2 (two) times daily. (Patient not taking: Reported on 04/09/2018) 20 tablet 0  . Calcium Carbonate-Vitamin D (CALTRATE 600+D PO) Take 1 tablet by mouth daily.     Current Facility-Administered Medications on File Prior to Visit  Medication Dose Route Frequency Provider Last Rate Last Dose  . albuterol (PROVENTIL) (2.5 MG/3ML) 0.083% nebulizer solution 2.5 mg  2.5 mg Nebulization Once Juline Patch, MD        There are no Patient Instructions on file for this visit. No follow-ups on file.   Kris Hartmann, NP  This note was completed with Sales executive.  Any errors are purely unintentional.

## 2018-04-13 ENCOUNTER — Encounter (INDEPENDENT_AMBULATORY_CARE_PROVIDER_SITE_OTHER): Payer: Self-pay

## 2018-04-13 ENCOUNTER — Encounter (INDEPENDENT_AMBULATORY_CARE_PROVIDER_SITE_OTHER): Payer: Self-pay | Admitting: Nurse Practitioner

## 2018-06-02 ENCOUNTER — Ambulatory Visit
Admission: RE | Admit: 2018-06-02 | Discharge: 2018-06-02 | Disposition: A | Discharge status: Home / Self Care | Payer: Medicare Other | Source: Ambulatory Visit | Attending: Family Medicine | Admitting: Family Medicine

## 2018-06-02 ENCOUNTER — Ambulatory Visit
Admission: RE | Admit: 2018-06-02 | Discharge: 2018-06-02 | Disposition: A | Discharge status: Home / Self Care | Payer: Medicare Other | Attending: Family Medicine | Admitting: Family Medicine

## 2018-06-02 ENCOUNTER — Other Ambulatory Visit: Payer: Self-pay

## 2018-06-02 ENCOUNTER — Ambulatory Visit (INDEPENDENT_AMBULATORY_CARE_PROVIDER_SITE_OTHER): Payer: Medicare Other | Admitting: Family Medicine

## 2018-06-02 ENCOUNTER — Encounter: Payer: Self-pay | Admitting: Family Medicine

## 2018-06-02 VITALS — BP 132/78 | HR 71 | Ht 64.0 in | Wt 168.0 lb

## 2018-06-02 DIAGNOSIS — M5412 Radiculopathy, cervical region: Secondary | ICD-10-CM

## 2018-06-02 DIAGNOSIS — G5601 Carpal tunnel syndrome, right upper limb: Secondary | ICD-10-CM

## 2018-06-02 DIAGNOSIS — M542 Cervicalgia: Secondary | ICD-10-CM | POA: Diagnosis not present

## 2018-06-02 MED ORDER — PREDNISONE 10 MG PO TABS: ORAL_TABLET | ORAL | 1 refills | Status: DC

## 2018-06-02 NOTE — Progress Notes (Signed)
Date:  06/02/2018   Name:  Kiara Becker   DOB:  1945-03-20   MRN:  [id number]   Chief Complaint: Back Pain (Two visits ago was having bad back pain. New problem is her hands her tingling and numb. Wrist and hand. Numbness starts in thumb. Mostly right sided. )  Neck Pain   This is a new problem. The current episode started 1 to 4 weeks ago (3 weeks). The problem has been unchanged. The pain is associated with a sleep position (position with sewing/ lying down). The pain is present in the right side (lower posterior). The quality of the pain is described as aching. The pain is at a severity of 4/10. The pain is moderate. Exacerbated by: positional when sewing. The pain is same all the time. Associated symptoms include tingling and weakness. Pertinent negatives include no chest pain, fever, headaches, leg pain, numbness, pain with swallowing, paresis, photophobia, syncope, trouble swallowing, visual change or weight loss. Associated symptoms comments: Arm pain. She has tried acetaminophen and NSAIDs (aspercreme) for the symptoms. The treatment provided moderate relief.  Back Pain  This is a chronic problem. The current episode started more than 1 year ago. The problem has been waxing and waning since onset. The pain is present in the thoracic spine (high). Radiates to: right arm. The pain is at a severity of 4/10. The pain is mild. Associated symptoms include tingling and weakness. Pertinent negatives include no abdominal pain, chest pain, dysuria, fever, headaches, leg pain, numbness, paresis, pelvic pain or weight loss. She has tried NSAIDs (helps) for the symptoms.    Review of Systems  Constitutional: Negative.  Negative for chills, fatigue, fever, unexpected weight change and weight loss.  HENT: Negative for congestion, ear discharge, ear pain, rhinorrhea, sinus pressure, sneezing, sore throat and trouble swallowing.   Eyes: Negative for photophobia, pain, discharge, redness and itching.   Respiratory: Negative for cough, shortness of breath, wheezing and stridor.   Cardiovascular: Negative for chest pain and syncope.  Gastrointestinal: Negative for abdominal pain, blood in stool, constipation, diarrhea, nausea and vomiting.  Endocrine: Negative for cold intolerance, heat intolerance, polydipsia, polyphagia and polyuria.  Genitourinary: Negative for dysuria, flank pain, frequency, hematuria, menstrual problem, pelvic pain, urgency, vaginal bleeding and vaginal discharge.  Musculoskeletal: Positive for back pain and neck pain. Negative for arthralgias and myalgias.  Skin: Negative for rash.  Allergic/Immunologic: Negative for environmental allergies and food allergies.  Neurological: Positive for tingling and weakness. Negative for dizziness, light-headedness, numbness and headaches.  Hematological: Negative for adenopathy. Does not bruise/bleed easily.  Psychiatric/Behavioral: Negative for dysphoric mood. The patient is not nervous/anxious.     Patient Active Problem List   Diagnosis Date Noted  . Varicose veins of bilateral lower extremities with other complications 93/81/8299  . Reactive airway disease 08/11/2016  . Benign paroxysmal positional vertigo 08/11/2016  . Moderate asthma with acute exacerbation 11/05/2015  . Bronchiolitis 08/14/2014  . Medicare annual wellness visit, subsequent 07/24/2014  . Adiposity 07/24/2014  . Awareness of heartbeats 07/14/2014  . Beat, premature ventricular 07/14/2014  . Essential (primary) hypertension 06/16/2014  . Mixed hyperlipidemia 06/16/2014  . Frank hematuria 09/06/2013  . Kidney lump 09/06/2013  . Urge incontinence 09/06/2013    Allergies  Allergen Reactions  . Choline Fenofibrate Other (See Comments)    Roaring in ears  . Codeine Other (See Comments)    Unknown  . Codeine Sulfate Other (See Comments)  . Ezetimibe Other (See Comments)    Roaring in  ears  . Fenofibrate Other (See Comments)    Roaring in ears  .  Fenofibric Acid Other (See Comments)    Roaring in ears  . Gemfibrozil Other (See Comments)    Past Surgical History:  Procedure Laterality Date  . CHOLECYSTECTOMY  11/05/2010   Dr. Pat Patrick  . COLECTOMY  10/13/2007   Laparoscopic assisting ascending colectomy  . COLON SURGERY  2009   remove a mass  . COLONOSCOPY  03/2014   normal- cleared for 5 years- Dr Vira Agar  . GALLBLADDER SURGERY    . HERNIA REPAIR    . KNEE SURGERY Bilateral    menisacal repair and then replacement  . SKIN SURGERY     removal melanoma on arm and eye  . VAGINAL HYSTERECTOMY  1984    Social History   Tobacco Use  . Smoking status: Never Smoker  . Smokeless tobacco: Never Used  . Tobacco comment: smoking cessation materials not required  Substance Use Topics  . Alcohol use: No    Alcohol/week: 0.0 standard drinks  . Drug use: No     Medication list has been reviewed and updated.  Current Meds  Medication Sig  . albuterol (PROVENTIL HFA;VENTOLIN HFA) 108 (90 Base) MCG/ACT inhaler Inhale 2 puffs into the lungs every 6 (six) hours as needed for wheezing or shortness of breath.  . allopurinol (ZYLOPRIM) 100 MG tablet TAKE 1 TABLET(100 MG) BY MOUTH DAILY  . Calcium Carbonate-Vitamin D (CALTRATE 600+D PO) Take 1 tablet by mouth daily.  . cyclobenzaprine (FLEXERIL) 10 MG tablet TAKE 1 TABLET(10 MG) BY MOUTH AT BEDTIME  . meclizine (ANTIVERT) 25 MG tablet Take 1 tablet (25 mg total) by mouth 3 (three) times daily as needed for dizziness.  . meloxicam (MOBIC) 15 MG tablet TAKE 1 TABLET BY MOUTH DAILY  . Red Yeast Rice 600 MG CAPS Take by mouth.  . torsemide (DEMADEX) 10 MG tablet Take 10 mg by mouth as needed (twice a week as needed for ankle swelling). Dr Nehemiah Massed  . triamterene-hydrochlorothiazide (MAXZIDE-25) 37.5-25 MG tablet Take 0.5 tablets by mouth as directed. 3 times qweek- Dr Nehemiah Massed   Current Facility-Administered Medications for the 06/02/18 encounter (Office Visit) with Juline Patch, MD   Medication  . albuterol (PROVENTIL) (2.5 MG/3ML) 0.083% nebulizer solution 2.5 mg    PHQ 2/9 Scores 06/02/2018 08/05/2017 08/04/2016 12/20/2015  PHQ - 2 Score 1 0 0 0  PHQ- 9 Score - 0 - -    BP Readings from Last 3 Encounters:  06/02/18 132/78  04/09/18 135/78  02/01/18 120/70    Physical Exam Vitals signs and nursing note reviewed.  Constitutional:      General: She is not in acute distress.    Appearance: She is not diaphoretic.  HENT:     Head: Normocephalic and atraumatic.     Right Ear: Tympanic membrane, ear canal and external ear normal.     Left Ear: Tympanic membrane, ear canal and external ear normal.     Nose: Nose normal.  Eyes:     General:        Right eye: No discharge.        Left eye: No discharge.     Conjunctiva/sclera: Conjunctivae normal.     Pupils: Pupils are equal, round, and reactive to light.  Neck:     Musculoskeletal: Normal range of motion and neck supple.     Thyroid: No thyromegaly.     Vascular: No JVD.  Cardiovascular:     Rate  and Rhythm: Normal rate and regular rhythm.     Heart sounds: Normal heart sounds. No murmur. No friction rub. No gallop.   Pulmonary:     Effort: Pulmonary effort is normal.     Breath sounds: Normal breath sounds.  Abdominal:     General: Bowel sounds are normal.     Palpations: Abdomen is soft. There is no mass.     Tenderness: There is no abdominal tenderness. There is no guarding.  Musculoskeletal: Normal range of motion.     Right wrist: She exhibits normal range of motion, no tenderness, no bony tenderness, no swelling, no effusion and no crepitus.     Cervical back: She exhibits spasm. She exhibits no tenderness and no bony tenderness.     Comments: Positive phelen / tinel/trapezius spasm  Lymphadenopathy:     Cervical: No cervical adenopathy.  Skin:    General: Skin is warm and dry.  Neurological:     Mental Status: She is alert.     Sensory: Sensation is intact.     Deep Tendon Reflexes: Reflexes  are normal and symmetric.     Reflex Scores:      Tricep reflexes are 2+ on the right side and 2+ on the left side.      Bicep reflexes are 2+ on the right side and 2+ on the left side.      Brachioradialis reflexes are 2+ on the right side and 2+ on the left side.    Wt Readings from Last 3 Encounters:  06/02/18 168 lb (76.2 kg)  04/09/18 184 lb (83.5 kg)  02/01/18 181 lb (82.1 kg)    BP 132/78   Pulse 71   Ht 5\' 4"  (1.626 m)   Wt 168 lb (76.2 kg)   SpO2 97%   BMI 28.84 kg/m   Assessment and Plan:  1. Cervical radiculopathy Acute.  Persistent.  Patient has meloxicam that she will resume 10 mg once a day.  We will also put her on a tapering dose of prednisone.  Will obtain a cervical spine x-ray to assess degree of degeneration. - predniSONE (DELTASONE) 10 MG tablet; Taper 4,4,4,4,3,3,3,3,2,2,2,2,1,1,1,1  Dispense: 45 tablet; Refill: 1 - DG Cervical Spine Complete; Future  2. Carpal tunnel syndrome of right wrist Patient also has discomfort in her right hand.  Exam is positive for Phalen and Tinel sign.  Will begin tapering dose of prednisone as well as meloxicam.  And is been instructed to use a cock-up splint when she sleeps.  And perhaps to curtail activities may which may exacerbate condition.  Such as sewing - predniSONE (DELTASONE) 10 MG tablet; Taper 4,4,4,4,3,3,3,3,2,2,2,2,1,1,1,1  Dispense: 45 tablet; Refill: 1 - DG Cervical Spine Complete; Future

## 2018-06-15 ENCOUNTER — Encounter (INDEPENDENT_AMBULATORY_CARE_PROVIDER_SITE_OTHER): Payer: Self-pay

## 2018-06-17 ENCOUNTER — Other Ambulatory Visit (INDEPENDENT_AMBULATORY_CARE_PROVIDER_SITE_OTHER): Payer: Self-pay | Admitting: Nurse Practitioner

## 2018-06-17 MED ORDER — DOXYCYCLINE HYCLATE 100 MG PO CAPS: 100.0000 mg | ORAL_CAPSULE | Freq: Two times a day (BID) | ORAL | 0 refills | Status: DC

## 2018-06-17 NOTE — Progress Notes (Unsigned)
doxy 

## 2018-06-22 ENCOUNTER — Encounter (INDEPENDENT_AMBULATORY_CARE_PROVIDER_SITE_OTHER): Payer: Self-pay

## 2018-06-25 ENCOUNTER — Other Ambulatory Visit (INDEPENDENT_AMBULATORY_CARE_PROVIDER_SITE_OTHER): Payer: Medicare Other | Admitting: Vascular Surgery

## 2018-06-25 ENCOUNTER — Encounter (INDEPENDENT_AMBULATORY_CARE_PROVIDER_SITE_OTHER): Payer: Self-pay | Admitting: Vascular Surgery

## 2018-06-25 ENCOUNTER — Ambulatory Visit (INDEPENDENT_AMBULATORY_CARE_PROVIDER_SITE_OTHER): Payer: Medicare Other | Admitting: Vascular Surgery

## 2018-06-25 ENCOUNTER — Other Ambulatory Visit: Payer: Self-pay

## 2018-06-25 VITALS — BP 130/80 | HR 87 | Resp 16 | Ht 64.0 in | Wt 185.0 lb

## 2018-06-25 DIAGNOSIS — I83893 Varicose veins of bilateral lower extremities with other complications: Secondary | ICD-10-CM | POA: Diagnosis not present

## 2018-06-25 NOTE — Progress Notes (Signed)
Kiara Becker is a 73 y.o. female who presents with symptomatic venous reflux  Past Medical History:  Diagnosis Date  . Allergy   . Asthma   . Cancer ([place]) 2001   melanoma  . Colon cancer ([place]) 2009  . Hyperlipidemia   . Hypertension   . Ventral hernia     Past Surgical History:  Procedure Laterality Date  . CHOLECYSTECTOMY  11/05/2010   Dr. Pat Patrick  . COLECTOMY  10/13/2007   Laparoscopic assisting ascending colectomy  . COLON SURGERY  2009   remove a mass  . COLONOSCOPY  03/2014   normal- cleared for 5 years- Dr Vira Agar  . GALLBLADDER SURGERY    . HERNIA REPAIR    . KNEE SURGERY Bilateral    menisacal repair and then replacement  . SKIN SURGERY     removal melanoma on arm and eye  . VAGINAL HYSTERECTOMY  1984     Current Outpatient Medications:  .  albuterol (PROVENTIL HFA;VENTOLIN HFA) 108 (90 Base) MCG/ACT inhaler, Inhale 2 puffs into the lungs every 6 (six) hours as needed for wheezing or shortness of breath., Disp: 1 Inhaler, Rfl: 11 .  allopurinol (ZYLOPRIM) 100 MG tablet, TAKE 1 TABLET(100 MG) BY MOUTH DAILY, Disp: 90 tablet, Rfl: 3 .  Calcium Carbonate-Vitamin D (CALTRATE 600+D PO), Take 1 tablet by mouth daily., Disp: , Rfl:  .  cyclobenzaprine (FLEXERIL) 10 MG tablet, TAKE 1 TABLET(10 MG) BY MOUTH AT BEDTIME, Disp: 30 tablet, Rfl: 0 .  doxycycline (VIBRAMYCIN) 100 MG capsule, Take 1 capsule (100 mg total) by mouth 2 (two) times daily., Disp: 10 capsule, Rfl: 0 .  meclizine (ANTIVERT) 25 MG tablet, Take 1 tablet (25 mg total) by mouth 3 (three) times daily as needed for dizziness., Disp: 30 tablet, Rfl: 11 .  meloxicam (MOBIC) 15 MG tablet, TAKE 1 TABLET BY MOUTH DAILY, Disp: 90 tablet, Rfl: 0 .  predniSONE (DELTASONE) 10 MG tablet, Taper 4,4,4,4,3,3,3,3,2,2,2,2,1,1,1,1, Disp: 45 tablet, Rfl: 1 .  Red Yeast Rice 600 MG CAPS, Take by mouth., Disp: , Rfl:  .  torsemide (DEMADEX) 10 MG tablet, Take 10 mg by mouth as needed (twice a week as needed for ankle swelling). Dr  Nehemiah Massed, Disp: , Rfl:  .  triamterene-hydrochlorothiazide (MAXZIDE-25) 37.5-25 MG tablet, Take 0.5 tablets by mouth as directed. 3 times qweek- Dr Nehemiah Massed, Disp: , Rfl:   Current Facility-Administered Medications:  .  albuterol (PROVENTIL) (2.5 MG/3ML) 0.083% nebulizer solution 2.5 mg, 2.5 mg, Nebulization, Once, Juline Patch, MD  Allergies  Allergen Reactions  . Choline Fenofibrate Other (See Comments)    Roaring in ears  . Codeine Other (See Comments)    Unknown  . Codeine Sulfate Other (See Comments)  . Ezetimibe Other (See Comments)    Roaring in ears  . Fenofibrate Other (See Comments)    Roaring in ears  . Fenofibric Acid Other (See Comments)    Roaring in ears  . Gemfibrozil Other (See Comments)     Varicose veins of bilateral lower extremities with other complications     PLAN: The patient's left lower extremity was sterilely prepped and draped. The ultrasound machine was used to visualize the saphenous vein throughout its course. A segment in the just above the knee was selected for access. The superficial saphenous vein was accessed without difficulty using ultrasound guidance with a micropuncture needle. A 0.018 wire was then placed beyond the saphenofemoral junction and the needle was removed. The 65 cm sheath was then placed over the  wire and the wire and dilator were removed. The laser fiber was then placed through the sheath and its tip was placed approximately 4 centimeters below the saphenofemoral junction. Tumescent anesthesia was then created with a dilute lidocaine solution. Laser energy was then delivered with constant withdrawal of the sheath and laser fiber. Approximately 1239 joules of energy were delivered over a length of 27 centimeters using a 1470 Hz VenaCure machine at 7 W. Sterile dressings were placed. The patient tolerated the procedure well without obvious complications.   Follow-up in 1 week with post-laser duplex.

## 2018-06-29 ENCOUNTER — Ambulatory Visit (INDEPENDENT_AMBULATORY_CARE_PROVIDER_SITE_OTHER): Payer: Medicare Other

## 2018-06-29 ENCOUNTER — Other Ambulatory Visit: Payer: Self-pay

## 2018-06-29 ENCOUNTER — Encounter (INDEPENDENT_AMBULATORY_CARE_PROVIDER_SITE_OTHER): Payer: Self-pay

## 2018-06-29 DIAGNOSIS — I83893 Varicose veins of bilateral lower extremities with other complications: Secondary | ICD-10-CM

## 2018-06-29 DIAGNOSIS — Z9889 Other specified postprocedural states: Secondary | ICD-10-CM

## 2018-07-23 ENCOUNTER — Ambulatory Visit (INDEPENDENT_AMBULATORY_CARE_PROVIDER_SITE_OTHER): Payer: Medicare Other | Admitting: Nurse Practitioner

## 2018-07-23 ENCOUNTER — Other Ambulatory Visit: Payer: Self-pay

## 2018-07-23 ENCOUNTER — Encounter (INDEPENDENT_AMBULATORY_CARE_PROVIDER_SITE_OTHER): Payer: Self-pay | Admitting: Nurse Practitioner

## 2018-07-23 ENCOUNTER — Encounter (INDEPENDENT_AMBULATORY_CARE_PROVIDER_SITE_OTHER): Payer: Medicare Other

## 2018-07-23 ENCOUNTER — Encounter (INDEPENDENT_AMBULATORY_CARE_PROVIDER_SITE_OTHER): Payer: Self-pay

## 2018-07-23 VITALS — BP 136/75 | HR 83 | Resp 16 | Wt 186.6 lb

## 2018-07-23 DIAGNOSIS — Z79899 Other long term (current) drug therapy: Secondary | ICD-10-CM

## 2018-07-23 DIAGNOSIS — I83813 Varicose veins of bilateral lower extremities with pain: Secondary | ICD-10-CM | POA: Diagnosis not present

## 2018-07-23 DIAGNOSIS — I83893 Varicose veins of bilateral lower extremities with other complications: Secondary | ICD-10-CM

## 2018-07-23 DIAGNOSIS — Z9889 Other specified postprocedural states: Secondary | ICD-10-CM | POA: Diagnosis not present

## 2018-07-23 DIAGNOSIS — I10 Essential (primary) hypertension: Secondary | ICD-10-CM | POA: Diagnosis not present

## 2018-07-23 DIAGNOSIS — E782 Mixed hyperlipidemia: Secondary | ICD-10-CM

## 2018-07-29 DIAGNOSIS — I700 Atherosclerosis of aorta: Secondary | ICD-10-CM | POA: Diagnosis not present

## 2018-07-29 DIAGNOSIS — G5601 Carpal tunnel syndrome, right upper limb: Secondary | ICD-10-CM | POA: Diagnosis not present

## 2018-08-02 ENCOUNTER — Other Ambulatory Visit: Payer: Self-pay | Admitting: Family Medicine

## 2018-08-02 DIAGNOSIS — Z1231 Encounter for screening mammogram for malignant neoplasm of breast: Secondary | ICD-10-CM

## 2018-08-03 ENCOUNTER — Encounter (INDEPENDENT_AMBULATORY_CARE_PROVIDER_SITE_OTHER): Payer: Self-pay | Admitting: Nurse Practitioner

## 2018-08-03 NOTE — Progress Notes (Signed)
SUBJECTIVE:  Patient ID: [name], female    DOB: [birth year], 73 y.o.   MRN: [id number] Chief Complaint  Patient presents with  . Follow-up    post laser follow up    HPI  Kiara Becker is a 73 y.o. female The patient returns to the office for followup status post laser ablation of the left great saphenous vein on 06/25/2018. The patient notes multiple residual varicosities bilaterally which continued to hurt with dependent positions and remained tender to palpation. The patient's swelling is unchanged from preoperative status. The patient continues to wear graduated compression stockings on a daily basis but these are not eliminating the pain and discomfort. The patient continues to use over-the-counter anti-inflammatory medications to treat the pain and related symptoms but this has not given the patient relief. The patient notes the pain in the lower extremities is causing problems with daily exercise, problems at work and even with household activities such as preparing meals and doing dishes.  The patient is otherwise done well and there have been no complications related to the laser procedure or interval changes in the patient's overall   Venous ultrasound post laser shows successful laser ablation of the left great saphenous vein, no DVT identified.  The patient has not had intervention to the right lower extremity and it continues to give her multiple issues as described above. Past Medical History:  Diagnosis Date  . Allergy   . Asthma   . Cancer ([place]) 2001   melanoma  . Colon cancer ([place]) 2009  . Hyperlipidemia   . Hypertension   . Ventral hernia     Past Surgical History:  Procedure Laterality Date  . CHOLECYSTECTOMY  11/05/2010   Dr. Pat Patrick  . COLECTOMY  10/13/2007   Laparoscopic assisting ascending colectomy  . COLON SURGERY  2009   remove a mass  . COLONOSCOPY  03/2014   normal- cleared for 5 years- Dr Vira Agar  . GALLBLADDER SURGERY    . HERNIA REPAIR    .  KNEE SURGERY Bilateral    menisacal repair and then replacement  . SKIN SURGERY     removal melanoma on arm and eye  . VAGINAL HYSTERECTOMY  1984    Social History   Socioeconomic History  . Marital status: Married    Spouse name: Not on file  . Number of children: 2  . Years of education: some college  . Highest education level: 12th grade  Occupational History  . Occupation: Retired  Scientific laboratory technician  . Financial resource strain: Not hard at all  . Food insecurity    Worry: Never true    Inability: Never true  . Transportation needs    Medical: No    Non-medical: No  Tobacco Use  . Smoking status: Never Smoker  . Smokeless tobacco: Never Used  . Tobacco comment: smoking cessation materials not required  Substance and Sexual Activity  . Alcohol use: No    Alcohol/week: 0.0 standard drinks  . Drug use: No  . Sexual activity: Yes    Birth control/protection: None  Lifestyle  . Physical activity    Days per week: 6 days    Minutes per session: 60 min  . Stress: Not at all  Relationships  . Social Herbalist on phone: Patient refused    Gets together: Patient refused    Attends religious service: Patient refused    Active member of club or organization: Patient refused    Attends meetings  of clubs or organizations: Patient refused    Relationship status: Married  . Intimate partner violence    Fear of current or ex partner: No    Emotionally abused: No    Physically abused: No    Forced sexual activity: No  Other Topics Concern  . Not on file  Social History Narrative  . Not on file    Family History  Problem Relation Age of Onset  . Heart disease Mother   . Liver cancer Mother   . Heart disease Father   . Heart disease Brother   . Drug abuse Maternal Grandfather     Allergies  Allergen Reactions  . Choline Fenofibrate Other (See Comments)    Roaring in ears  . Codeine Other (See Comments)    Unknown  . Codeine Sulfate Other (See Comments)   . Ezetimibe Other (See Comments)    Roaring in ears  . Fenofibrate Other (See Comments)    Roaring in ears  . Fenofibric Acid Other (See Comments)    Roaring in ears  . Gemfibrozil Other (See Comments)     Review of Systems   Review of Systems: Negative Unless Checked Constitutional: [] Weight loss  [] Fever  [] Chills Cardiac: [] Chest pain   []  Atrial Fibrillation  [] Palpitations   [] Shortness of breath when laying flat   [] Shortness of breath with exertion. [] Shortness of breath at rest Vascular:  [] Pain in legs with walking   [] Pain in legs with standing [] Pain in legs when laying flat   [] Claudication    [] Pain in feet when laying flat    [] History of DVT   [] Phlebitis   [x] Swelling in legs   [x] Varicose veins   [] Non-healing ulcers Pulmonary:   [] Uses home oxygen   [] Productive cough   [] Hemoptysis   [] Wheeze  [] COPD   [] Asthma Neurologic:  [] Dizziness   [] Seizures  [] Blackouts [] History of stroke   [] History of TIA  [] Aphasia   [] Temporary Blindness   [] Weakness or numbness in arm   [] Weakness or numbness in leg Musculoskeletal:   [] Joint swelling   [] Joint pain   [] Low back pain  []  History of Knee Replacement [] Arthritis [] back Surgeries  []  Spinal Stenosis    Hematologic:  [] Easy bruising  [] Easy bleeding   [] Hypercoagulable state   [] Anemic Gastrointestinal:  [] Diarrhea   [] Vomiting  [] Gastroesophageal reflux/heartburn   [] Difficulty swallowing. [] Abdominal pain Genitourinary:  [] Chronic kidney disease   [] Difficult urination  [] Anuric   [] Blood in urine [] Frequent urination  [] Burning with urination   [] Hematuria Skin:  [] Rashes   [] Ulcers [] Wounds Psychological:  [] History of anxiety   []  History of major depression  []  Memory Difficulties      OBJECTIVE:   Physical Exam  BP 136/75 (BP Location: Right Arm)   Pulse 83   Resp 16   Wt 186 lb 9.6 oz (84.6 kg)   BMI 32.03 kg/m   Gen: WD/WN, NAD Head: [place]/AT, No temporalis wasting.  Ear/Nose/Throat: Hearing grossly intact,  nares w/o erythema or drainage Eyes: PER, EOMI, sclera nonicteric.  Neck: Supple, no masses.  No JVD.  Pulmonary:  Good air movement, no use of accessory muscles.  Cardiac: RRR Vascular: scattered varicosities present bilaterally.  Mild venous stasis changes to the legs bilaterally.  2+ soft pitting edema  Vessel Right Left  Radial Palpable Palpable  Dorsalis Pedis Palpable Palpable  Posterior Tibial Palpable Palpable   Gastrointestinal: soft, non-distended. No guarding/no peritoneal signs.  Musculoskeletal: M/S 5/5 throughout.  No deformity or atrophy.  Neurologic: Pain and light touch intact in extremities.  Symmetrical.  Speech is fluent. Motor exam as listed above. Psychiatric: Judgment intact, Mood & affect appropriate for pt's clinical situation. Dermatologic: No Venous rashes. No Ulcers Noted.  No changes consistent with cellulitis. Lymph : No Cervical lymphadenopathy, no lichenification or skin changes of chronic lymphedema.       ASSESSMENT AND PLAN:  1. Varicose veins of bilateral lower extremities with other complications Recommend  I have reviewed my previous  discussion with the patient regarding  varicose veins and why they cause symptoms. Patient will continue  wearing graduated compression stockings class 1 on a daily basis, beginning first thing in the morning and removing them in the evening.    In addition, behavioral modification including elevation during the day was again discussed and this will continue.  The patient has utilized over the counter pain medications and has been exercising.  However, at this time conservative therapy has not alleviated the patient's symptoms of leg pain and swelling  Recommend: laser ablation of the right  great saphenous veins to eliminate the symptoms of pain and swelling of the lower extremities caused by the severe superficial venous reflux disease.   2. Mixed hyperlipidemia Continue statin as ordered and reviewed, no  changes at this time   3. Essential (primary) hypertension Continue antihypertensive medications as already ordered, these medications have been reviewed and there are no changes at this time.    Current Outpatient Medications on File Prior to Visit  Medication Sig Dispense Refill  . albuterol (PROVENTIL HFA;VENTOLIN HFA) 108 (90 Base) MCG/ACT inhaler Inhale 2 puffs into the lungs every 6 (six) hours as needed for wheezing or shortness of breath. 1 Inhaler 11  . allopurinol (ZYLOPRIM) 100 MG tablet TAKE 1 TABLET(100 MG) BY MOUTH DAILY 90 tablet 3  . Calcium Carbonate-Vitamin D (CALTRATE 600+D PO) Take 1 tablet by mouth daily.    . cyclobenzaprine (FLEXERIL) 10 MG tablet TAKE 1 TABLET(10 MG) BY MOUTH AT BEDTIME 30 tablet 0  . meclizine (ANTIVERT) 25 MG tablet Take 1 tablet (25 mg total) by mouth 3 (three) times daily as needed for dizziness. 30 tablet 11  . meloxicam (MOBIC) 15 MG tablet TAKE 1 TABLET BY MOUTH DAILY 90 tablet 0  . predniSONE (DELTASONE) 10 MG tablet Taper 4,4,4,4,3,3,3,3,2,2,2,2,1,1,1,1 45 tablet 1  . Red Yeast Rice 600 MG CAPS Take by mouth.    . torsemide (DEMADEX) 10 MG tablet Take 10 mg by mouth as needed (twice a week as needed for ankle swelling). Dr Nehemiah Massed    . triamterene-hydrochlorothiazide (MAXZIDE-25) 37.5-25 MG tablet Take 0.5 tablets by mouth as directed. 3 times qweek- Dr Nehemiah Massed    . doxycycline (VIBRAMYCIN) 100 MG capsule Take 1 capsule (100 mg total) by mouth 2 (two) times daily. (Patient not taking: Reported on 07/23/2018) 10 capsule 0   Current Facility-Administered Medications on File Prior to Visit  Medication Dose Route Frequency Provider Last Rate Last Dose  . albuterol (PROVENTIL) (2.5 MG/3ML) 0.083% nebulizer solution 2.5 mg  2.5 mg Nebulization Once Juline Patch, MD        There are no Patient Instructions on file for this visit. No follow-ups on file.   Kris Hartmann, NP  This note was completed with Sales executive.  Any errors are  purely unintentional.

## 2018-08-30 ENCOUNTER — Ambulatory Visit (INDEPENDENT_AMBULATORY_CARE_PROVIDER_SITE_OTHER): Payer: Medicare Other

## 2018-08-30 VITALS — BP 130/94 | HR 69 | Temp 98.2°F | Ht 64.0 in | Wt 180.6 lb

## 2018-08-30 DIAGNOSIS — Z0000 Encounter for general adult medical examination without abnormal findings: Secondary | ICD-10-CM

## 2018-08-30 NOTE — Progress Notes (Signed)
Subjective:   Kiara Becker is a 73 y.o. female who presents for Medicare Annual (Subsequent) preventive examination.  Virtual Visit via Telephone Note  I connected with Kiara Becker on 08/30/18 at 11:20 AM EDT by telephone and verified that I am speaking with the correct person using two identifiers.   Medicare Annual Wellness visit completed telephonically due to Covid-19 pandemic.   Location: Patient: home Provider: office   I discussed the limitations, risks, security and privacy concerns of performing an evaluation and management service by telephone and the availability of in person appointments. The patient expressed understanding and agreed to proceed.  Some vital signs may be absent or patient reported.   Clemetine Marker, LPN  Review of Systems:   Cardiac Risk Factors include: advanced age (>39men, >61 women);obesity (BMI >30kg/m2);hypertension     Objective:     Vitals: BP (!) 130/94   Pulse 69   Temp 98.2 F (36.8 C)   Ht 5\' 4"  (1.626 m)   Wt 180 lb 9.6 oz (81.9 kg)   BMI 31.00 kg/m   Body mass index is 31 kg/m.  Advanced Directives 08/30/2018 08/05/2017 08/04/2016 12/20/2015 10/23/2014 07/24/2014  Does Patient Have a Medical Advance Directive? No No No No No No  Would patient like information on creating a medical advance directive? No - Patient declined Yes (MAU/Ambulatory/Procedural Areas - Information given) Yes (MAU/Ambulatory/Procedural Areas - Information given) - No - patient declined information No - patient declined information    Tobacco Social History   Tobacco Use  Smoking Status Never Smoker  Smokeless Tobacco Never Used  Tobacco Comment   smoking cessation materials not required     Counseling given: Not Answered Comment: smoking cessation materials not required   Clinical Intake:  Pre-visit preparation completed: Yes  Pain : No/denies pain     BMI - recorded: 31 Nutritional Status: BMI > 30  Obese Nutritional Risks: None  Diabetes: No  How often do you need to have someone help you when you read instructions, pamphlets, or other written materials from your doctor or pharmacy?: 1 - Never  Interpreter Needed?: No  Information entered by :: Clemetine Marker LPN  Past Medical History:  Diagnosis Date  . Allergy   . Asthma   . Cancer ([place]) 2001   melanoma  . Colon cancer ([place]) 2009  . Hyperlipidemia   . Hypertension   . Ventral hernia    Past Surgical History:  Procedure Laterality Date  . CHOLECYSTECTOMY  11/05/2010   Dr. Pat Patrick  . COLECTOMY  10/13/2007   Laparoscopic assisting ascending colectomy  . COLON SURGERY  2009   remove a mass  . COLONOSCOPY  03/2014   normal- cleared for 5 years- Dr Vira Agar  . GALLBLADDER SURGERY    . HERNIA REPAIR    . KNEE SURGERY Bilateral    menisacal repair and then replacement  . SKIN SURGERY     removal melanoma on arm and eye  . VAGINAL HYSTERECTOMY  1984   Family History  Problem Relation Age of Onset  . Heart disease Mother   . Liver cancer Mother   . Heart disease Father   . Heart disease Brother   . Drug abuse Maternal Grandfather    Social History   Socioeconomic History  . Marital status: Married    Spouse name: Not on file  . Number of children: 2  . Years of education: some college  . Highest education level: 12th grade  Occupational History  .  Occupation: Retired  Scientific laboratory technician  . Financial resource strain: Not hard at all  . Food insecurity    Worry: Never true    Inability: Never true  . Transportation needs    Medical: No    Non-medical: No  Tobacco Use  . Smoking status: Never Smoker  . Smokeless tobacco: Never Used  . Tobacco comment: smoking cessation materials not required  Substance and Sexual Activity  . Alcohol use: No    Alcohol/week: 0.0 standard drinks  . Drug use: No  . Sexual activity: Yes    Birth control/protection: None  Lifestyle  . Physical activity    Days per week: 6 days    Minutes per session: 60 min  .  Stress: Not at all  Relationships  . Social connections    Talks on phone: More than three times a week    Gets together: Three times a week    Attends religious service: More than 4 times per year    Active member of club or organization: No    Attends meetings of clubs or organizations: Never    Relationship status: Married  Other Topics Concern  . Not on file  Social History Narrative  . Not on file    Outpatient Encounter Medications as of 08/30/2018  Medication Sig  . albuterol (PROVENTIL HFA;VENTOLIN HFA) 108 (90 Base) MCG/ACT inhaler Inhale 2 puffs into the lungs every 6 (six) hours as needed for wheezing or shortness of breath.  . allopurinol (ZYLOPRIM) 100 MG tablet TAKE 1 TABLET(100 MG) BY MOUTH DAILY  . Calcium Carbonate-Vitamin D (CALTRATE 600+D PO) Take 1 tablet by mouth daily.  . meclizine (ANTIVERT) 25 MG tablet Take 1 tablet (25 mg total) by mouth 3 (three) times daily as needed for dizziness.  . meloxicam (MOBIC) 15 MG tablet TAKE 1 TABLET BY MOUTH DAILY  . Red Yeast Rice 600 MG CAPS Take by mouth.  . torsemide (DEMADEX) 10 MG tablet Take 10 mg by mouth as needed (twice a week as needed for ankle swelling). Dr Nehemiah Massed  . triamterene-hydrochlorothiazide (MAXZIDE-25) 37.5-25 MG tablet Take 0.5 tablets by mouth as directed. 3 times qweek- Dr Nehemiah Massed  . [DISCONTINUED] cyclobenzaprine (FLEXERIL) 10 MG tablet TAKE 1 TABLET(10 MG) BY MOUTH AT BEDTIME  . [DISCONTINUED] doxycycline (VIBRAMYCIN) 100 MG capsule Take 1 capsule (100 mg total) by mouth 2 (two) times daily. (Patient not taking: Reported on 07/23/2018)  . [DISCONTINUED] predniSONE (DELTASONE) 10 MG tablet Taper 4,4,4,4,3,3,3,3,2,2,2,2,1,1,1,1   Facility-Administered Encounter Medications as of 08/30/2018  Medication  . albuterol (PROVENTIL) (2.5 MG/3ML) 0.083% nebulizer solution 2.5 mg    Activities of Daily Living In your present state of health, do you have any difficulty performing the following activities:  08/30/2018  Hearing? Y  Comment wears hearing aids  Vision? N  Difficulty concentrating or making decisions? N  Walking or climbing stairs? N  Dressing or bathing? N  Doing errands, shopping? N  Preparing Food and eating ? N  Using the Toilet? N  In the past six months, have you accidently leaked urine? Y  Comment sees urologist Dr. Thurmond Butts at [facility] you have problems with loss of bowel control? N  Managing your Medications? N  Managing your Finances? N  Housekeeping or managing your Housekeeping? N  Some recent data might be hidden    Patient Care Team: Juline Patch, MD as PCP - General (Family Medicine) Jannet Mantis, MD (Dermatology) Corey Skains, MD as Consulting Physician (Cardiology)  Assessment:   This is a routine wellness examination for [place].  Exercise Activities and Dietary recommendations Current Exercise Habits: Home exercise routine, Type of exercise: walking, Time (Minutes): 45, Frequency (Times/Week): 6, Weekly Exercise (Minutes/Week): 270, Intensity: Mild, Exercise limited by: respiratory conditions(s)  Goals    . DIET - INCREASE WATER INTAKE     Recommend to drink at least 6-8 8oz glasses of water per day.       Fall Risk Fall Risk  08/30/2018 06/02/2018 08/05/2017 08/04/2016 12/20/2015  Falls in the past year? 0 0 No No No  Number falls in past yr: 0 0 - - -  Injury with Fall? 0 0 - - -  Risk for fall due to : - - Impaired vision - -  Follow up Falls prevention discussed Falls evaluation completed - - -   FALL RISK PREVENTION PERTAINING TO THE HOME:  Any stairs in or around the home? Yes  If so, do they handrails? No  - 1 step into sunken room  Home free of loose throw rugs in walkways, pet beds, electrical cords, etc? Yes  Adequate lighting in your home to reduce risk of falls? Yes   ASSISTIVE DEVICES UTILIZED TO PREVENT FALLS:  Life alert? No  Use of a cane, walker or w/c? No  Grab bars in the bathroom? No  Shower chair or  bench in shower? Yes  Elevated toilet seat or a handicapped toilet? No   DME ORDERS:  DME order needed?  No   TIMED UP AND GO:  Was the test performed? No . Telephonic visit  Education: Fall risk prevention has been discussed.  Intervention(s) required? No    Depression Screen PHQ 2/9 Scores 08/30/2018 06/02/2018 08/05/2017 08/04/2016  PHQ - 2 Score 0 1 0 0  PHQ- 9 Score - - 0 -     Cognitive Function     6CIT Screen 08/30/2018 08/05/2017 08/04/2016  What Year? 0 points 0 points 0 points  What month? 0 points 0 points 0 points  What time? 0 points 0 points 0 points  Count back from 20 0 points 0 points 0 points  Months in reverse 0 points 0 points 0 points  Repeat phrase 0 points 0 points 2 points  Total Score 0 0 2    Immunization History  Administered Date(s) Administered  . Influenza, High Dose Seasonal PF 10/30/2017  . Influenza,inj,Quad PF,6+ Mos 12/04/2014  . Influenza-Unspecified 12/16/2016  . Pneumococcal Conjugate-13 07/25/2015  . Pneumococcal Polysaccharide-23 06/24/2012  . Tdap 06/24/2012  . Zoster 06/04/2014    Qualifies for Shingles Vaccine? Yes  Zostavax completed 2016. Due for Shingrix. Education has been provided regarding the importance of this vaccine. Pt has been advised to call insurance company to determine out of pocket expense. Advised may also receive vaccine at local pharmacy or Health Dept. Verbalized acceptance and understanding.  Tdap: Up to date  Flu Vaccine: Up to date  Pneumococcal Vaccine: Up to date    Screening Tests Health Maintenance  Topic Date Due  . MAMMOGRAM  07/24/2018  . INFLUENZA VACCINE  09/04/2018  . COLONOSCOPY  03/06/2020  . TETANUS/TDAP  06/25/2022  . DEXA SCAN  Completed  . Hepatitis C Screening  Completed  . PNA vac Low Risk Adult  Completed    Cancer Screenings:  Colorectal Screening: Completed 03/07/15. Repeat every 5 years;   Mammogram: Completed 07/23/17. Repeat every year. Scheduled for 09/07/18.   Bone  Density: Completed 08/25/16. Results reflect NORMAL. Repeat every 2  years.   Lung Cancer Screening: (Low Dose CT Chest recommended if Age 48-80 years, 30 pack-year currently smoking OR have quit w/in 15years.) does not qualify.   Additional Screening:  Hepatitis C Screening: does qualify; Completed 08/11/16  Vision Screening: Recommended annual ophthalmology exams for early detection of glaucoma and other disorders of the eye. Is the patient up to date with their annual eye exam?  Yes  Who is the provider or what is the name of the office in which the pt attends annual eye exams? [facility]  Dental Screening: Recommended annual dental exams for proper oral hygiene  Community Resource Referral:  CRR required this visit?  No      Plan:     I have personally reviewed and addressed the Medicare Annual Wellness questionnaire and have noted the following in the patient's chart:  A. Medical and social history B. Use of alcohol, tobacco or illicit drugs  C. Current medications and supplements D. Functional ability and status E.  Nutritional status F.  Physical activity G. Advance directives H. List of other physicians I.  Hospitalizations, surgeries, and ER visits in previous 12 months J.  [place] such as hearing and vision if needed, cognitive and depression L. Referrals and appointments   In addition, I have reviewed and discussed with patient certain preventive protocols, quality metrics, and best practice recommendations. A written personalized care plan for preventive services as well as general preventive health recommendations were provided to patient.   Signed,  Clemetine Marker, LPN Nurse Health Advisor   Nurse Notes: pt doing well and appreciative of visit today.

## 2018-08-30 NOTE — Patient Instructions (Signed)
Kiara Becker , Thank you for taking time to come for your Medicare Wellness Visit. I appreciate your ongoing commitment to your health goals. Please review the following plan we discussed and let me know if I can assist you in the future.   Screening recommendations/referrals: Colonoscopy: done 03/07/15. Repeat in 2023. Mammogram: done 07/23/17. Scheduled for 09/07/18. Bone Density: done 08/25/16 Recommended yearly ophthalmology/optometry visit for glaucoma screening and checkup Recommended yearly dental visit for hygiene and checkup  Vaccinations: Influenza vaccine: done 10/30/17 Pneumococcal vaccine: done 07/25/15 Tdap vaccine: done 06/24/12 Shingles vaccine: Shingrix discussed. Please contact your pharmacy for coverage information.   Advanced directives: Please bring a copy of your health care power of attorney and living will to the office at your convenience once you have completed those documents.   Conditions/risks identified: Keep up the great work!  Next appointment: Please follow up in one year for your Medicare Annual Wellness visit.     Preventive Care 73 Years and Older, Female Preventive care refers to lifestyle choices and visits with your health care provider that can promote health and wellness. What does preventive care include?  A yearly physical exam. This is also called an annual well check.  Dental exams once or twice a year.  Routine eye exams. Ask your health care provider how often you should have your eyes checked.  Personal lifestyle choices, including:  Daily care of your teeth and gums.  Regular physical activity.  Eating a healthy diet.  Avoiding tobacco and drug use.  Limiting alcohol use.  Practicing safe sex.  Taking low-dose aspirin every day.  Taking vitamin and mineral supplements as recommended by your health care provider. What happens during an annual well check? The services and screenings done by your health care provider during your  annual well check will depend on your age, overall health, lifestyle risk factors, and family history of disease. Counseling  Your health care provider may ask you questions about your:  Alcohol use.  Tobacco use.  Drug use.  Emotional well-being.  Home and relationship well-being.  Sexual activity.  Eating habits.  History of falls.  Memory and ability to understand (cognition).  Work and work Statistician.  Reproductive health. Screening  You may have the following tests or measurements:  Height, weight, and BMI.  Blood pressure.  Lipid and cholesterol levels. These may be checked every 5 years, or more frequently if you are over 5 years old.  Skin check.  Lung cancer screening. You may have this screening every year starting at age 19 if you have a 30-pack-year history of smoking and currently smoke or have quit within the past 15 years.  Fecal occult blood test (FOBT) of the stool. You may have this test every year starting at age 65.  Flexible sigmoidoscopy or colonoscopy. You may have a sigmoidoscopy every 5 years or a colonoscopy every 10 years starting at age 52.  Hepatitis C blood test.  Hepatitis B blood test.  Sexually transmitted disease (STD) testing.  Diabetes screening. This is done by checking your blood sugar (glucose) after you have not eaten for a while (fasting). You may have this done every 1-3 years.  Bone density scan. This is done to screen for osteoporosis. You may have this done starting at age 23.  Mammogram. This may be done every 1-2 years. Talk to your health care provider about how often you should have regular mammograms. Talk with your health care provider about your test results, treatment options, and  if necessary, the need for more tests. Vaccines  Your health care provider may recommend certain vaccines, such as:  Influenza vaccine. This is recommended every year.  Tetanus, diphtheria, and acellular pertussis (Tdap, Td)  vaccine. You may need a Td booster every 10 years.  Zoster vaccine. You may need this after age 78.  Pneumococcal 13-valent conjugate (PCV13) vaccine. One dose is recommended after age 68.  Pneumococcal polysaccharide (PPSV23) vaccine. One dose is recommended after age 40. Talk to your health care provider about which screenings and vaccines you need and how often you need them. This information is not intended to replace advice given to you by your health care provider. Make sure you discuss any questions you have with your health care provider. Document Released: 02/16/2015 Document Revised: 10/10/2015 Document Reviewed: 11/21/2014 Elsevier Interactive Patient Education  2017 [employer] Prevention in the Home Falls can cause injuries. They can happen to people of all ages. There are many things you can do to make your home safe and to help prevent falls. What can I do on the outside of my home?  Regularly fix the edges of walkways and driveways and fix any cracks.  Remove anything that might make you trip as you walk through a door, such as a raised step or threshold.  Trim any bushes or trees on the path to your home.  Use bright outdoor lighting.  Clear any walking paths of anything that might make someone trip, such as rocks or tools.  Regularly check to see if handrails are loose or broken. Make sure that both sides of any steps have handrails.  Any raised decks and porches should have guardrails on the edges.  Have any leaves, snow, or ice cleared regularly.  Use sand or salt on walking paths during winter.  Clean up any spills in your garage right away. This includes oil or grease spills. What can I do in the bathroom?  Use night lights.  Install grab bars by the toilet and in the tub and shower. Do not use towel bars as grab bars.  Use non-skid mats or decals in the tub or shower.  If you need to sit down in the shower, use a plastic, non-slip stool.   Keep the floor dry. Clean up any water that spills on the floor as soon as it happens.  Remove soap buildup in the tub or shower regularly.  Attach bath mats securely with double-sided non-slip rug tape.  Do not have throw rugs and other things on the floor that can make you trip. What can I do in the bedroom?  Use night lights.  Make sure that you have a light by your bed that is easy to reach.  Do not use any sheets or blankets that are too big for your bed. They should not hang down onto the floor.  Have a firm chair that has side arms. You can use this for support while you get dressed.  Do not have throw rugs and other things on the floor that can make you trip. What can I do in the kitchen?  Clean up any spills right away.  Avoid walking on wet floors.  Keep items that you use a lot in easy-to-reach places.  If you need to reach something above you, use a strong step stool that has a grab bar.  Keep electrical cords out of the way.  Do not use floor polish or wax that makes floors slippery. If you  must use wax, use non-skid floor wax.  Do not have throw rugs and other things on the floor that can make you trip. What can I do with my stairs?  Do not leave any items on the stairs.  Make sure that there are handrails on both sides of the stairs and use them. Fix handrails that are broken or loose. Make sure that handrails are as long as the stairways.  Check any carpeting to make sure that it is firmly attached to the stairs. Fix any carpet that is loose or worn.  Avoid having throw rugs at the top or bottom of the stairs. If you do have throw rugs, attach them to the floor with carpet tape.  Make sure that you have a light switch at the top of the stairs and the bottom of the stairs. If you do not have them, ask someone to add them for you. What else can I do to help prevent falls?  Wear shoes that:  Do not have high heels.  Have rubber bottoms.  Are  comfortable and fit you well.  Are closed at the toe. Do not wear sandals.  If you use a stepladder:  Make sure that it is fully opened. Do not climb a closed stepladder.  Make sure that both sides of the stepladder are locked into place.  Ask someone to hold it for you, if possible.  Clearly mark and make sure that you can see:  Any grab bars or handrails.  First and last steps.  Where the edge of each step is.  Use tools that help you move around (mobility aids) if they are needed. These include:  Canes.  Walkers.  Scooters.  Crutches.  Turn on the lights when you go into a dark area. Replace any light bulbs as soon as they burn out.  Set up your furniture so you have a clear path. Avoid moving your furniture around.  If any of your floors are uneven, fix them.  If there are any pets around you, be aware of where they are.  Review your medicines with your doctor. Some medicines can make you feel dizzy. This can increase your chance of falling. Ask your doctor what other things that you can do to help prevent falls. This information is not intended to replace advice given to you by your health care provider. Make sure you discuss any questions you have with your health care provider. Document Released: 11/16/2008 Document Revised: 06/28/2015 Document Reviewed: 02/24/2014 Elsevier Interactive Patient Education  2017 [employer].

## 2018-09-01 DIAGNOSIS — E782 Mixed hyperlipidemia: Secondary | ICD-10-CM | POA: Diagnosis not present

## 2018-09-07 ENCOUNTER — Ambulatory Visit
Admission: RE | Admit: 2018-09-07 | Discharge: 2018-09-07 | Disposition: A | Discharge status: Home / Self Care | Payer: Medicare Other | Source: Ambulatory Visit | Attending: Family Medicine | Admitting: Family Medicine

## 2018-09-07 DIAGNOSIS — Z1231 Encounter for screening mammogram for malignant neoplasm of breast: Secondary | ICD-10-CM | POA: Diagnosis not present

## 2018-09-08 DIAGNOSIS — I10 Essential (primary) hypertension: Secondary | ICD-10-CM | POA: Diagnosis not present

## 2018-09-08 DIAGNOSIS — E782 Mixed hyperlipidemia: Secondary | ICD-10-CM | POA: Diagnosis not present

## 2018-09-08 DIAGNOSIS — I493 Ventricular premature depolarization: Secondary | ICD-10-CM | POA: Diagnosis not present

## 2018-09-11 ENCOUNTER — Encounter (INDEPENDENT_AMBULATORY_CARE_PROVIDER_SITE_OTHER): Payer: Self-pay

## 2018-09-13 ENCOUNTER — Other Ambulatory Visit (INDEPENDENT_AMBULATORY_CARE_PROVIDER_SITE_OTHER): Payer: Self-pay | Admitting: Nurse Practitioner

## 2018-09-13 MED ORDER — DOXYCYCLINE HYCLATE 100 MG PO CAPS: 100.0000 mg | ORAL_CAPSULE | Freq: Two times a day (BID) | ORAL | 0 refills | Status: DC

## 2018-09-17 ENCOUNTER — Ambulatory Visit (INDEPENDENT_AMBULATORY_CARE_PROVIDER_SITE_OTHER): Payer: Medicare Other | Admitting: Vascular Surgery

## 2018-09-17 ENCOUNTER — Encounter (INDEPENDENT_AMBULATORY_CARE_PROVIDER_SITE_OTHER): Payer: Self-pay | Admitting: Vascular Surgery

## 2018-09-17 ENCOUNTER — Other Ambulatory Visit: Payer: Self-pay

## 2018-09-17 VITALS — BP 133/83 | HR 73 | Resp 12 | Ht 64.0 in | Wt 186.0 lb

## 2018-09-17 DIAGNOSIS — I83891 Varicose veins of right lower extremities with other complications: Secondary | ICD-10-CM | POA: Diagnosis not present

## 2018-09-17 DIAGNOSIS — I83893 Varicose veins of bilateral lower extremities with other complications: Secondary | ICD-10-CM

## 2018-09-17 NOTE — Progress Notes (Signed)
Kiara Becker is a 73 y.o. female who presents with symptomatic venous reflux  Past Medical History:  Diagnosis Date  . Allergy   . Asthma   . Cancer ([place]) 2001   melanoma  . Colon cancer ([place]) 2009  . Hyperlipidemia   . Hypertension   . Ventral hernia     Past Surgical History:  Procedure Laterality Date  . CHOLECYSTECTOMY  11/05/2010   Dr. Pat Patrick  . COLECTOMY  10/13/2007   Laparoscopic assisting ascending colectomy  . COLON SURGERY  2009   remove a mass  . COLONOSCOPY  03/2014   normal- cleared for 5 years- Dr Vira Agar  . GALLBLADDER SURGERY    . HERNIA REPAIR    . KNEE SURGERY Bilateral    menisacal repair and then replacement  . SKIN SURGERY     removal melanoma on arm and eye  . VAGINAL HYSTERECTOMY  1984     Current Outpatient Medications:  .  lovastatin (MEVACOR) 20 MG tablet, Take by mouth., Disp: , Rfl:  .  albuterol (PROVENTIL HFA;VENTOLIN HFA) 108 (90 Base) MCG/ACT inhaler, Inhale 2 puffs into the lungs every 6 (six) hours as needed for wheezing or shortness of breath., Disp: 1 Inhaler, Rfl: 11 .  allopurinol (ZYLOPRIM) 100 MG tablet, TAKE 1 TABLET(100 MG) BY MOUTH DAILY, Disp: 90 tablet, Rfl: 3 .  Calcium Carbonate-Vitamin D (CALTRATE 600+D PO), Take 1 tablet by mouth daily., Disp: , Rfl:  .  doxycycline (VIBRAMYCIN) 100 MG capsule, Take 1 capsule (100 mg total) by mouth 2 (two) times daily., Disp: 10 capsule, Rfl: 0 .  meclizine (ANTIVERT) 25 MG tablet, Take 1 tablet (25 mg total) by mouth 3 (three) times daily as needed for dizziness., Disp: 30 tablet, Rfl: 11 .  meloxicam (MOBIC) 15 MG tablet, TAKE 1 TABLET BY MOUTH DAILY, Disp: 90 tablet, Rfl: 0 .  Red Yeast Rice 600 MG CAPS, Take by mouth., Disp: , Rfl:  .  torsemide (DEMADEX) 10 MG tablet, Take 10 mg by mouth as needed (twice a week as needed for ankle swelling). Dr Nehemiah Massed, Disp: , Rfl:  .  triamterene-hydrochlorothiazide (MAXZIDE-25) 37.5-25 MG tablet, Take 0.5 tablets by mouth as directed. 3 times qweek-  Dr Nehemiah Massed, Disp: , Rfl:   Current Facility-Administered Medications:  .  albuterol (PROVENTIL) (2.5 MG/3ML) 0.083% nebulizer solution 2.5 mg, 2.5 mg, Nebulization, Once, Juline Patch, MD  Allergies  Allergen Reactions  . Choline Fenofibrate Other (See Comments)    Roaring in ears  . Codeine Other (See Comments)    Unknown  . Codeine Sulfate Other (See Comments)  . Ezetimibe Other (See Comments)    Roaring in ears  . Fenofibrate Other (See Comments)    Roaring in ears  . Fenofibric Acid Other (See Comments)    Roaring in ears  . Gemfibrozil Other (See Comments)     Varicose veins of bilateral lower extremities with other complications    PLAN: The patient's right lower extremity was sterilely prepped and draped. The ultrasound machine was used to visualize the saphenous vein throughout its course. A segment just below the knee was selected for access. The saphenous vein was accessed without difficulty using ultrasound guidance with a micropuncture needle. A 0.018 wire was then placed beyond the saphenofemoral junction and the needle was removed. The 65 cm sheath was then placed over the wire and the wire and dilator were removed. The laser fiber was then placed through the sheath and its tip was placed approximately 4-5  centimeters below the saphenofemoral junction. Tumescent anesthesia was then created with a dilute lidocaine solution. Laser energy was then delivered with constant withdrawal of the sheath and laser fiber. Approximately 1152 joules of energy were delivered over a length of 30 centimeters using a 1470 Hz VenaCure machine at 7 W. Sterile dressings were placed. The patient tolerated the procedure well without obvious complications.   Follow-up in 1 week with post-laser duplex.   Also, the patient is already undergone successful laser ablation of the left great saphenous vein.  She is bothered by residual varicosities on that leg and can be scheduled for sclerotherapy  and foam sclerotherapy on the left leg in the coming weeks.

## 2018-09-20 ENCOUNTER — Other Ambulatory Visit: Payer: Self-pay

## 2018-09-20 ENCOUNTER — Ambulatory Visit (INDEPENDENT_AMBULATORY_CARE_PROVIDER_SITE_OTHER): Payer: Medicare Other

## 2018-09-20 DIAGNOSIS — I83893 Varicose veins of bilateral lower extremities with other complications: Secondary | ICD-10-CM

## 2018-09-20 DIAGNOSIS — I83891 Varicose veins of right lower extremities with other complications: Secondary | ICD-10-CM

## 2018-09-27 DIAGNOSIS — N329 Bladder disorder, unspecified: Secondary | ICD-10-CM | POA: Diagnosis not present

## 2018-09-27 DIAGNOSIS — N2889 Other specified disorders of kidney and ureter: Secondary | ICD-10-CM | POA: Diagnosis not present

## 2018-09-29 ENCOUNTER — Encounter (INDEPENDENT_AMBULATORY_CARE_PROVIDER_SITE_OTHER): Payer: Self-pay

## 2018-09-29 ENCOUNTER — Telehealth (INDEPENDENT_AMBULATORY_CARE_PROVIDER_SITE_OTHER): Payer: Self-pay | Admitting: Nurse Practitioner

## 2018-09-29 NOTE — Telephone Encounter (Signed)
[name], I just received a call from your office to schedule a procedure on my left leg.  I told the nice lady that I needed to think about that.  Could you please tell me what Dr. Lucky Cowboy would be doing on my left leg since he has already worked on it?  I am just confused!  I thought I was only to have one procedure on each leg and that has been done. So sorry to bother you with this. Kiara Becker(07/10/1945)  Called patient and spoke with her about what she was contacted to be scheduled for. She was confused and thought we were trying to schedule her for another laser apt. I advised patient that we contacted her to schedule sclerotherapy for her leg. Patient verbalized understanding and wishes to schedule apt now. I advised Lupita Raider is the one in our office to schedule these.  AS, CMA

## 2018-10-04 DIAGNOSIS — N2889 Other specified disorders of kidney and ureter: Secondary | ICD-10-CM | POA: Diagnosis not present

## 2018-10-15 ENCOUNTER — Other Ambulatory Visit: Payer: Self-pay

## 2018-10-15 ENCOUNTER — Encounter (INDEPENDENT_AMBULATORY_CARE_PROVIDER_SITE_OTHER): Payer: Self-pay | Admitting: Nurse Practitioner

## 2018-10-15 ENCOUNTER — Ambulatory Visit (INDEPENDENT_AMBULATORY_CARE_PROVIDER_SITE_OTHER): Payer: Medicare Other | Admitting: Nurse Practitioner

## 2018-10-15 VITALS — BP 138/64 | HR 78 | Resp 12 | Ht 64.0 in | Wt 184.0 lb

## 2018-10-15 DIAGNOSIS — I83893 Varicose veins of bilateral lower extremities with other complications: Secondary | ICD-10-CM

## 2018-10-15 DIAGNOSIS — E782 Mixed hyperlipidemia: Secondary | ICD-10-CM | POA: Diagnosis not present

## 2018-10-15 DIAGNOSIS — I10 Essential (primary) hypertension: Secondary | ICD-10-CM

## 2018-10-15 NOTE — Progress Notes (Signed)
SUBJECTIVE:  Patient ID: Kiara Becker, female    DOB: 02/20/1945, 73 y.o.   MRN: [id number] Chief Complaint  Patient presents with   Follow-up    HPI  Kiara Becker is a 73 y.o. female The patient returns to the office for followup status post laser ablation of the right saphenous vein on 09/17/2018. She had her left great saphenous vein done on 06/25/2018. The patient note significant improvement in the lower extremity pain but not resolution of the symptoms. The patient notes multiple residual varicosities bilaterally which continued to hurt with dependent positions and remained tender to palpation. The patient's swelling is minimally from preoperative status. The patient continues to wear graduated compression stockings on a daily basis but these are not eliminating the pain and discomfort. The patient continues to use over-the-counter anti-inflammatory medications to treat the pain and related symptoms but this has not given the patient relief. The patient notes the pain in the lower extremities is causing problems with daily exercise, problems at work and even with household activities such as preparing meals and doing dishes.  The patient is otherwise done well and there have been no complications related to the laser procedure or interval changes in the patient's overall   Post laser ultrasound shows successful ablation of the right great saphenous vein.  Past Medical History:  Diagnosis Date   Allergy    Asthma    Cancer ([place]) 2001   melanoma   Colon cancer ([place]) 2009   Hyperlipidemia    Hypertension    Ventral hernia     Past Surgical History:  Procedure Laterality Date   CHOLECYSTECTOMY  11/05/2010   Dr. Pat Patrick   COLECTOMY  10/13/2007   Laparoscopic assisting ascending colectomy   COLON SURGERY  2009   remove a mass   COLONOSCOPY  03/2014   normal- cleared for 5 years- Dr Vira Agar   GALLBLADDER SURGERY     HERNIA REPAIR     KNEE SURGERY Bilateral    menisacal repair and then replacement   SKIN SURGERY     removal melanoma on arm and eye   VAGINAL HYSTERECTOMY  1984    Social History   Socioeconomic History   Marital status: Married    Spouse name: Not on file   Number of children: 2   Years of education: some college   Highest education level: 12th grade  Occupational History   Occupation: Retired  Scientist, product/process development strain: Not hard at [employer]    Worry: Never true    Inability: Never true   Transportation needs    Medical: No    Non-medical: No  Tobacco Use   Smoking status: Never Smoker   Smokeless tobacco: Never Used   Tobacco comment: smoking cessation materials not required  Substance and Sexual Activity   Alcohol use: No    Alcohol/week: 0.0 standard drinks   Drug use: No   Sexual activity: Yes    Birth control/protection: None  Lifestyle   Physical activity    Days per week: 6 days    Minutes per session: 60 min   Stress: Not at all  Relationships   Social connections    Talks on phone: More than three times a week    Gets together: Three times a week    Attends religious service: More than 4 times per year    Active member of club or organization: No    Attends meetings of  clubs or organizations: Never    Relationship status: Married   Intimate partner violence    Fear of current or ex partner: No    Emotionally abused: No    Physically abused: No    Forced sexual activity: No  Other Topics Concern   Not on file  Social History Narrative   Not on file    Family History  Problem Relation Age of Onset   Heart disease Mother    Liver cancer Mother    Heart disease Father    Heart disease Brother    Drug abuse Maternal Grandfather     Allergies  Allergen Reactions   Choline Fenofibrate Other (See Comments)    Roaring in ears   Codeine Other (See Comments)    Unknown   Codeine Sulfate Other (See Comments)   Ezetimibe Other  (See Comments)    Roaring in ears   Fenofibrate Other (See Comments)    Roaring in ears   Fenofibric Acid Other (See Comments)    Roaring in ears   Gemfibrozil Other (See Comments)     Review of Systems   Review of Systems: Negative Unless Checked Constitutional: [] Weight loss  [] Fever  [] Chills Cardiac: [] Chest pain   []  Atrial Fibrillation  [] Palpitations   [] Shortness of breath when laying flat   [] Shortness of breath with exertion. [] Shortness of breath at rest Vascular:  [] Pain in legs with walking   [] Pain in legs with standing [] Pain in legs when laying flat   [] Claudication    [] Pain in feet when laying flat    [] History of DVT   [] Phlebitis   [x] Swelling in legs   [x] Varicose veins   [] Non-healing ulcers Pulmonary:   [] Uses home oxygen   [] Productive cough   [] Hemoptysis   [] Wheeze  [] COPD   [x] Asthma Neurologic:  [] Dizziness   [] Seizures  [] Blackouts [] History of stroke   [] History of TIA  [] Aphasia   [] Temporary Blindness   [] Weakness or numbness in arm   [] Weakness or numbness in leg Musculoskeletal:   [] Joint swelling   [] Joint pain   [] Low back pain  [x]  History of Knee Replacement [] Arthritis [] back Surgeries  []  Spinal Stenosis    Hematologic:  [] Easy bruising  [] Easy bleeding   [] Hypercoagulable state   [] Anemic Gastrointestinal:  [] Diarrhea   [] Vomiting  [] Gastroesophageal reflux/heartburn   [] Difficulty swallowing. [] Abdominal pain Genitourinary:  [] Chronic kidney disease   [] Difficult urination  [] Anuric   [] Blood in urine [] Frequent urination  [] Burning with urination   [] Hematuria Skin:  [] Rashes   [] Ulcers [] Wounds Psychological:  [] History of anxiety   []  History of major depression  []  Memory Difficulties      OBJECTIVE:   Physical Exam  BP 138/64 (BP Location: Left Arm, Patient Position: Sitting, Cuff Size: Normal)    Pulse 78    Resp 12    Ht 5\' 4"  (1.626 m)    Wt 184 lb (83.5 kg)    BMI 31.58 kg/m   Gen: WD/WN, NAD Head: [place]/AT, No temporalis wasting.    Ear/Nose/Throat: Hearing grossly intact, nares w/o erythema or drainage Eyes: PER, EOMI, sclera nonicteric.  Neck: Supple, no masses.  No JVD.  Pulmonary:  Good air movement, no use of accessory muscles.  Cardiac: RRR Vascular: scattered varicosities present bilaterally.  Mild venous stasis changes to the legs bilaterally.  2+ soft pitting edema  Vessel Right Left  Radial Palpable Palpable  Dorsalis Pedis Palpable Palpable  Posterior Tibial Palpable Palpable   Gastrointestinal: soft,  non-distended. No guarding/no peritoneal signs.  Musculoskeletal: M/S 5/5 throughout.  No deformity or atrophy.  Neurologic: Pain and light touch intact in extremities.  Symmetrical.  Speech is fluent. Motor exam as listed above. Psychiatric: Judgment intact, Mood & affect appropriate for pt's clinical situation. Dermatologic: No Venous rashes. No Ulcers Noted.  No changes consistent with cellulitis. Lymph : No Cervical lymphadenopathy, no lichenification or skin changes of chronic lymphedema.       ASSESSMENT AND PLAN:  1. Varicose veins of bilateral lower extremities with other complications Recommend:  The patient has had successful ablation of the previously incompetent saphenous venous system but still has persistent symptoms of pain and swelling that are having a negative impact on daily life and daily activities.  Patient should undergo injection sclerotherapy to treat the residual varicosities.  The risks, benefits and alternative therapies were reviewed in detail with the patient.  All questions were answered.  The patient agrees to proceed with sclerotherapy at their convenience.  The patient will continue wearing the graduated compression stockings and using the over-the-counter pain medications to treat her symptoms.       2. Essential (primary) hypertension Continue antihypertensive medications as already ordered, these medications have been reviewed and there are no changes at this  time. Continue antihypertensive medications as already ordered, these medications have been reviewed and there are no changes at this time.   3. Mixed hyperlipidemia Continue statin as ordered and reviewed, no changes at this time    Current Outpatient Medications on File Prior to Visit  Medication Sig Dispense Refill   albuterol (PROVENTIL HFA;VENTOLIN HFA) 108 (90 Base) MCG/ACT inhaler Inhale 2 puffs into the lungs every 6 (six) hours as needed for wheezing or shortness of breath. 1 Inhaler 11   allopurinol (ZYLOPRIM) 100 MG tablet TAKE 1 TABLET(100 MG) BY MOUTH DAILY 90 tablet 3   Calcium Carbonate-Vitamin D (CALTRATE 600+D PO) Take 1 tablet by mouth daily.     doxycycline (VIBRAMYCIN) 100 MG capsule Take 1 capsule (100 mg total) by mouth 2 (two) times daily. 10 capsule 0   lovastatin (MEVACOR) 20 MG tablet Take by mouth.     meclizine (ANTIVERT) 25 MG tablet Take 1 tablet (25 mg total) by mouth 3 (three) times daily as needed for dizziness. 30 tablet 11   meloxicam (MOBIC) 15 MG tablet TAKE 1 TABLET BY MOUTH DAILY 90 tablet 0   Red Yeast Rice 600 MG CAPS Take by mouth.     torsemide (DEMADEX) 10 MG tablet Take 10 mg by mouth as needed (twice a week as needed for ankle swelling). Dr Nehemiah Massed     triamterene-hydrochlorothiazide (MAXZIDE-25) 37.5-25 MG tablet Take 0.5 tablets by mouth as directed. 3 times qweek- Dr Nehemiah Massed     Current Facility-Administered Medications on File Prior to Visit  Medication Dose Route Frequency Provider Last Rate Last Dose   albuterol (PROVENTIL) (2.5 MG/3ML) 0.083% nebulizer solution 2.5 mg  2.5 mg Nebulization Once Juline Patch, MD        There are no Patient Instructions on file for this visit. No follow-ups on file.   Kris Hartmann, NP  This note was completed with Sales executive.  Any errors are purely unintentional.

## 2018-11-02 DIAGNOSIS — R202 Paresthesia of skin: Secondary | ICD-10-CM | POA: Diagnosis not present

## 2018-11-02 DIAGNOSIS — R200 Anesthesia of skin: Secondary | ICD-10-CM | POA: Diagnosis not present

## 2018-11-04 DIAGNOSIS — G5602 Carpal tunnel syndrome, left upper limb: Secondary | ICD-10-CM | POA: Diagnosis not present

## 2018-11-10 ENCOUNTER — Ambulatory Visit (INDEPENDENT_AMBULATORY_CARE_PROVIDER_SITE_OTHER): Payer: Medicare Other | Admitting: Vascular Surgery

## 2018-11-10 DIAGNOSIS — G5601 Carpal tunnel syndrome, right upper limb: Secondary | ICD-10-CM | POA: Diagnosis not present

## 2018-11-15 ENCOUNTER — Ambulatory Visit (INDEPENDENT_AMBULATORY_CARE_PROVIDER_SITE_OTHER): Payer: Medicare Other | Admitting: Family Medicine

## 2018-11-15 ENCOUNTER — Other Ambulatory Visit: Payer: Self-pay

## 2018-11-15 ENCOUNTER — Encounter: Payer: Self-pay | Admitting: Family Medicine

## 2018-11-15 VITALS — BP 120/70 | HR 80 | Ht 64.0 in | Wt 181.0 lb

## 2018-11-15 DIAGNOSIS — Z23 Encounter for immunization: Secondary | ICD-10-CM

## 2018-11-15 DIAGNOSIS — I700 Atherosclerosis of aorta: Secondary | ICD-10-CM

## 2018-11-15 DIAGNOSIS — Z0000 Encounter for general adult medical examination without abnormal findings: Secondary | ICD-10-CM | POA: Diagnosis not present

## 2018-11-15 NOTE — Patient Instructions (Signed)
Mediterranean Diet A Mediterranean diet refers to food and lifestyle choices that are based on the traditions of countries located on the [employer]. This way of eating has been shown to help prevent certain conditions and improve outcomes for people who have chronic diseases, like kidney disease and heart disease. What are tips for following this plan? Lifestyle  Cook and eat meals together with your family, when possible.  Drink enough fluid to keep your urine clear or pale yellow.  Be physically active every day. This includes: ? Aerobic exercise like running or swimming. ? Leisure activities like gardening, walking, or housework.  Get 7-8 hours of sleep each night.  If recommended by your health care provider, drink red wine in moderation. This means 1 glass a day for nonpregnant women and 2 glasses a day for men. A glass of wine equals 5 oz (150 mL). Reading food labels   Check the serving size of packaged foods. For foods such as rice and pasta, the serving size refers to the amount of cooked product, not dry.  Check the total fat in packaged foods. Avoid foods that have saturated fat or trans fats.  Check the ingredients list for added sugars, such as corn syrup. Shopping  At the grocery store, buy most of your food from the areas near the walls of the store. This includes: ? Fresh fruits and vegetables (produce). ? Grains, beans, nuts, and seeds. Some of these may be available in unpackaged forms or large amounts (in bulk). ? Fresh seafood. ? Poultry and eggs. ? Low-fat dairy products.  Buy whole ingredients instead of prepackaged foods.  Buy fresh fruits and vegetables in-season from local farmers markets.  Buy frozen fruits and vegetables in resealable bags.  If you do not have access to quality fresh seafood, buy precooked frozen shrimp or canned fish, such as tuna, salmon, or sardines.  Buy small amounts of raw or cooked vegetables, salads, or olives from  the deli or salad bar at your store.  Stock your pantry so you always have certain foods on hand, such as olive oil, canned tuna, canned tomatoes, rice, pasta, and beans. Cooking  Cook foods with extra-virgin olive oil instead of using butter or other vegetable oils.  Have meat as a side dish, and have vegetables or grains as your main dish. This means having meat in small portions or adding small amounts of meat to foods like pasta or stew.  Use beans or vegetables instead of meat in common dishes like chili or lasagna.  Experiment with different cooking methods. Try roasting or broiling vegetables instead of steaming or sauteing them.  Add frozen vegetables to soups, stews, pasta, or rice.  Add nuts or seeds for added healthy fat at each meal. You can add these to yogurt, salads, or vegetable dishes.  Marinate fish or vegetables using olive oil, lemon juice, garlic, and fresh herbs. Meal planning   Plan to eat 1 vegetarian meal one day each week. Try to work up to 2 vegetarian meals, if possible.  Eat seafood 2 or more times a week.  Have healthy snacks readily available, such as: ? Vegetable sticks with hummus. ? [place] yogurt. ? Fruit and nut trail mix.  Eat balanced meals throughout the week. This includes: ? Fruit: 2-3 servings a day ? Vegetables: 4-5 servings a day ? Low-fat dairy: 2 servings a day ? Fish, poultry, or lean meat: 1 serving a day ? Beans and legumes: 2 or more servings a week ?  Nuts and seeds: 1-2 servings a day ? Whole grains: 6-8 servings a day ? Extra-virgin olive oil: 3-4 servings a day  Limit red meat and sweets to only a few servings a month What are my food choices?  Mediterranean diet ? Recommended  Grains: Whole-grain pasta. Brown rice. Bulgar wheat. Polenta. Couscous. Whole-wheat bread. Oatmeal. Quinoa.  Vegetables: Artichokes. Beets. Broccoli. Cabbage. Carrots. Eggplant. Green beans. Chard. Kale. Spinach. Onions. Leeks. Peas. Squash.  Tomatoes. Peppers. Radishes.  Fruits: Apples. Apricots. Avocado. Berries. Bananas. Cherries. Dates. Figs. Grapes. Lemons. Melon. Oranges. Peaches. Plums. Pomegranate.  Meats and other protein foods: Beans. Almonds. Sunflower seeds. Pine nuts. Peanuts. Cod. Salmon. Scallops. Shrimp. Tuna. Tilapia. Clams. Oysters. Eggs.  Dairy: Low-fat milk. Cheese. Greek yogurt.  Beverages: Water. Red wine. Herbal tea.  Fats and oils: Extra virgin olive oil. Avocado oil. Grape seed oil.  Sweets and desserts: Greek yogurt with honey. Baked apples. Poached pears. Trail mix.  Seasoning and other foods: Basil. Cilantro. Coriander. Cumin. Mint. Parsley. Sage. Rosemary. Tarragon. Garlic. Oregano. Thyme. Pepper. Balsalmic vinegar. Tahini. Hummus. Tomato sauce. Olives. Mushrooms. ? Limit these  Grains: Prepackaged pasta or rice dishes. Prepackaged cereal with added sugar.  Vegetables: Deep fried potatoes (french fries).  Fruits: Fruit canned in syrup.  Meats and other protein foods: Beef. Pork. Lamb. Poultry with skin. Hot dogs. Bacon.  Dairy: Ice cream. Sour cream. Whole milk.  Beverages: Juice. Sugar-sweetened soft drinks. Beer. Liquor and spirits.  Fats and oils: Butter. Canola oil. Vegetable oil. Beef fat (tallow). Lard.  Sweets and desserts: Cookies. Cakes. Pies. Candy.  Seasoning and other foods: Mayonnaise. Premade sauces and marinades. The items listed may not be a complete list. Talk with your dietitian about what dietary choices are right for you. Summary  The Mediterranean diet includes both food and lifestyle choices.  Eat a variety of fresh fruits and vegetables, beans, nuts, seeds, and whole grains.  Limit the amount of red meat and sweets that you eat.  Talk with your health care provider about whether it is safe for you to drink red wine in moderation. This means 1 glass a day for nonpregnant women and 2 glasses a day for men. A glass of wine equals 5 oz (150 mL). This information  is not intended to replace advice given to you by your health care provider. Make sure you discuss any questions you have with your health care provider. Document Released: 09/13/2015 Document Revised: 09/20/2015 Document Reviewed: 09/13/2015 Elsevier Patient Education  2020 [employer]  

## 2018-11-15 NOTE — Progress Notes (Signed)
Date:  11/15/2018   Name:  Kiara Becker   DOB:  07-18-1945   MRN:  [id number]   Chief Complaint: Annual Exam (yearly physical) and influenza vacc need  .Marland KitchenPatient is a 73 year old female who presents for a comprehensive physical exam. The patient reports the following problems: chest pain. Health maintenance has been reviewed up to date.   Review of Systems  Constitutional: Negative.  Negative for chills, fatigue, fever and unexpected weight change.  HENT: Negative for congestion, ear discharge, ear pain, rhinorrhea, sinus pressure, sneezing and sore throat.   Eyes: Negative for photophobia, pain, discharge, redness and itching.  Respiratory: Negative for cough, shortness of breath, wheezing and stridor.   Gastrointestinal: Negative for abdominal pain, blood in stool, constipation, diarrhea, nausea and vomiting.  Endocrine: Negative for cold intolerance, heat intolerance, polydipsia, polyphagia and polyuria.  Genitourinary: Negative for dysuria, flank pain, frequency, hematuria, menstrual problem, pelvic pain, urgency, vaginal bleeding and vaginal discharge.  Musculoskeletal: Negative for arthralgias, back pain and myalgias.  Skin: Negative for rash.  Allergic/Immunologic: Negative for environmental allergies and food allergies.  Neurological: Negative for dizziness, weakness, light-headedness, numbness and headaches.  Hematological: Negative for adenopathy. Does not bruise/bleed easily.  Psychiatric/Behavioral: Negative for dysphoric mood. The patient is not nervous/anxious.     Patient Active Problem List   Diagnosis Date Noted  . Varicose veins of bilateral lower extremities with other complications [id number]  . Aortic atherosclerosis ([place]) 09/26/2016  . Bladder neoplasm of uncertain malignant potential 08/22/2016  . Reactive airway disease 08/11/2016  . Benign paroxysmal positional vertigo 08/11/2016  . Moderate asthma with acute exacerbation 11/05/2015  . Bronchiolitis  08/14/2014  . Medicare annual wellness visit, subsequent 07/24/2014  . Adiposity 07/24/2014  . Awareness of heartbeats 07/14/2014  . Beat, premature ventricular 07/14/2014  . Essential (primary) hypertension 06/16/2014  . Mixed hyperlipidemia 06/16/2014  . Midline cystocele 09/21/2013  . Frank hematuria 09/06/2013  . Renal mass, right 09/06/2013  . Urge incontinence 09/06/2013    Allergies  Allergen Reactions  . Choline Fenofibrate Other (See Comments)    Roaring in ears  . Codeine Other (See Comments)    Unknown  . Codeine Sulfate Other (See Comments)  . Ezetimibe Other (See Comments)    Roaring in ears  . Fenofibrate Other (See Comments)    Roaring in ears  . Fenofibric Acid Other (See Comments)    Roaring in ears  . Gemfibrozil Other (See Comments)    Past Surgical History:  Procedure Laterality Date  . CHOLECYSTECTOMY  11/05/2010   Dr. Pat Patrick  . COLECTOMY  10/13/2007   Laparoscopic assisting ascending colectomy  . COLON SURGERY  2009   remove a mass  . COLONOSCOPY  03/2014   normal- cleared for 5 years- Dr Vira Agar  . GALLBLADDER SURGERY    . HERNIA REPAIR    . KNEE SURGERY Bilateral    menisacal repair and then replacement  . SKIN SURGERY     removal melanoma on arm and eye  . VAGINAL HYSTERECTOMY  1984    Social History   Tobacco Use  . Smoking status: Never Smoker  . Smokeless tobacco: Never Used  . Tobacco comment: smoking cessation materials not required  Substance Use Topics  . Alcohol use: No    Alcohol/week: 0.0 standard drinks  . Drug use: No     Medication list has been reviewed and updated.  Current Meds  Medication Sig  . albuterol (PROVENTIL HFA;VENTOLIN HFA) 108 (90 Base) MCG/ACT  inhaler Inhale 2 puffs into the lungs every 6 (six) hours as needed for wheezing or shortness of breath.  . allopurinol (ZYLOPRIM) 100 MG tablet TAKE 1 TABLET(100 MG) BY MOUTH DAILY  . Calcium Carbonate-Vitamin D (CALTRATE 600+D PO) Take 1 tablet by mouth daily.   Marland Kitchen lovastatin (MEVACOR) 20 MG tablet Take by mouth.  . meclizine (ANTIVERT) 25 MG tablet Take 1 tablet (25 mg total) by mouth 3 (three) times daily as needed for dizziness.  . meloxicam (MOBIC) 15 MG tablet TAKE 1 TABLET BY MOUTH DAILY  . Red Yeast Rice 600 MG CAPS Take by mouth.  . torsemide (DEMADEX) 10 MG tablet Take 10 mg by mouth as needed (twice a week as needed for ankle swelling). Dr Nehemiah Massed  . triamterene-hydrochlorothiazide (MAXZIDE-25) 37.5-25 MG tablet Take 0.5 tablets by mouth as directed. 3 times qweek- Dr Nehemiah Massed   Current Facility-Administered Medications for the 11/15/18 encounter (Office Visit) with Juline Patch, MD  Medication  . albuterol (PROVENTIL) (2.5 MG/3ML) 0.083% nebulizer solution 2.5 mg    PHQ 2/9 Scores 11/15/2018 08/30/2018 06/02/2018 08/05/2017  PHQ - 2 Score 0 0 1 0  PHQ- 9 Score 0 - - 0    BP Readings from Last 3 Encounters:  11/15/18 120/70  10/15/18 138/64  09/17/18 133/83    Physical Exam Vitals signs and nursing note reviewed.  Constitutional:      General: She is not in acute distress.    Appearance: She is overweight. She is not ill-appearing, toxic-appearing or diaphoretic.  HENT:     Head: Normocephalic and atraumatic.     Jaw: There is normal jaw occlusion.     Right Ear: Hearing, tympanic membrane, ear canal and external ear normal.     Left Ear: Hearing, tympanic membrane, ear canal and external ear normal.     Nose: Nose normal. No nasal tenderness, congestion or rhinorrhea.     Mouth/Throat:     Lips: Pink.     Mouth: Mucous membranes are moist.     Dentition: Normal dentition.     Tongue: No lesions.     Palate: No mass and lesions.     Pharynx: Oropharynx is clear.     Tonsils: No tonsillar exudate.  Eyes:     General: Lids are normal. No visual field deficit.       Right eye: No foreign body or discharge.        Left eye: No foreign body or discharge.     Extraocular Movements: Extraocular movements intact.      Conjunctiva/sclera: Conjunctivae normal.     Pupils: Pupils are equal, round, and reactive to light.     Funduscopic exam:    Right eye: Red reflex present.        Left eye: Red reflex present. Neck:     Musculoskeletal: Full passive range of motion without pain, normal range of motion and neck supple.     Thyroid: No thyroid mass, thyromegaly or thyroid tenderness.     Vascular: No JVD.  Cardiovascular:     Rate and Rhythm: Normal rate and regular rhythm.     Chest Wall: PMI is not displaced. No thrill.     Pulses: Normal pulses.          Carotid pulses are 2+ on the right side and 2+ on the left side.      Radial pulses are 2+ on the right side and 2+ on the left side.  Femoral pulses are 2+ on the right side and 2+ on the left side.      Popliteal pulses are 2+ on the right side and 2+ on the left side.       Dorsalis pedis pulses are 2+ on the right side and 2+ on the left side.       Posterior tibial pulses are 2+ on the right side and 2+ on the left side.     Heart sounds: Normal heart sounds, S1 normal and S2 normal. No murmur. No systolic murmur. No diastolic murmur. No friction rub. No gallop. No S3 or S4 sounds.   Pulmonary:     Effort: Pulmonary effort is normal. No respiratory distress.     Breath sounds: Normal breath sounds. No stridor or decreased air movement. No decreased breath sounds, wheezing, rhonchi or rales.  Chest:     Chest wall: No tenderness.     Breasts: Breasts are symmetrical.        Right: Normal. No swelling, bleeding, inverted nipple, mass, nipple discharge, skin change or tenderness.        Left: Normal. No swelling, bleeding, inverted nipple, mass, nipple discharge, skin change or tenderness.  Abdominal:     General: Bowel sounds are normal.     Palpations: Abdomen is soft. There is no hepatomegaly, splenomegaly or mass.     Tenderness: There is no abdominal tenderness. There is no guarding or rebound.  Genitourinary:    Labia:        Right:  No rash, tenderness or lesion.        Left: No rash, tenderness or lesion.   Musculoskeletal: Normal range of motion.     Right lower leg: No edema.     Left lower leg: No edema.  Lymphadenopathy:     Head:     Right side of head: No submental, submandibular or tonsillar adenopathy.     Left side of head: No submandibular or tonsillar adenopathy.     Cervical: No cervical adenopathy.     Right cervical: No superficial, deep or posterior cervical adenopathy.    Left cervical: No superficial, deep or posterior cervical adenopathy.     Upper Body:     Right upper body: No supraclavicular, axillary or pectoral adenopathy.     Left upper body: No supraclavicular, axillary or pectoral adenopathy.     Lower Body: No right inguinal adenopathy. No left inguinal adenopathy.  Skin:    General: Skin is warm and dry.     Capillary Refill: Capillary refill takes less than 2 seconds.  Neurological:     Mental Status: She is alert.     Cranial Nerves: Cranial nerves are intact.     Sensory: Sensory deficit present.     Motor: Motor function is intact.     Deep Tendon Reflexes: Reflexes are normal and symmetric.     Reflex Scores:      Tricep reflexes are 2+ on the right side and 2+ on the left side.      Bicep reflexes are 2+ on the right side and 2+ on the left side.      Brachioradialis reflexes are 2+ on the right side and 2+ on the left side.      Patellar reflexes are 2+ on the right side and 2+ on the left side.      Achilles reflexes are 2+ on the right side and 2+ on the left side.    Comments: Right medium  Wt Readings from Last 3 Encounters:  11/15/18 181 lb (82.1 kg)  10/15/18 184 lb (83.5 kg)  09/17/18 186 lb (84.4 kg)    BP 120/70   Pulse 80   Ht 5\' 4"  (1.626 m)   Wt 181 lb (82.1 kg)   BMI 31.07 kg/m   Assessment and Plan: 1. Annual physical exam No subjective objective concerns noted during history/physical exam.Kiara Becker is a 73 y.o. female who presents today  for her Complete Annual Exam. She feels well. She reports exercising . She reports she is sleeping well. Immunizations are reviewed and recommendations provided.   Age appropriate screening tests are discussed. Counseling given for risk factor reduction interventions.  2. Influenza vaccine needed Discussed and administered - Flu Vaccine QUAD High Dose(Fluad)  3. Aortic atherosclerosis ([facility]) We will continue to monitor patient's lipid status, hypertension and encourage low-dose aspirin to reduce risk of coronary artery disease.  Patient was also given Mediterranean diet which will also aid in decreasing risk of CAD.

## 2018-12-01 ENCOUNTER — Ambulatory Visit (INDEPENDENT_AMBULATORY_CARE_PROVIDER_SITE_OTHER): Payer: Medicare Other | Admitting: Vascular Surgery

## 2018-12-01 DIAGNOSIS — Z859 Personal history of malignant neoplasm, unspecified: Secondary | ICD-10-CM | POA: Diagnosis not present

## 2018-12-01 DIAGNOSIS — L578 Other skin changes due to chronic exposure to nonionizing radiation: Secondary | ICD-10-CM | POA: Diagnosis not present

## 2018-12-01 DIAGNOSIS — L570 Actinic keratosis: Secondary | ICD-10-CM | POA: Diagnosis not present

## 2018-12-01 DIAGNOSIS — Z86018 Personal history of other benign neoplasm: Secondary | ICD-10-CM | POA: Diagnosis not present

## 2018-12-01 DIAGNOSIS — Z85820 Personal history of malignant melanoma of skin: Secondary | ICD-10-CM | POA: Diagnosis not present

## 2018-12-10 ENCOUNTER — Other Ambulatory Visit: Payer: Self-pay

## 2018-12-10 DIAGNOSIS — E790 Hyperuricemia without signs of inflammatory arthritis and tophaceous disease: Secondary | ICD-10-CM

## 2018-12-10 MED ORDER — ALLOPURINOL 100 MG PO TABS: ORAL_TABLET | ORAL | 0 refills | Status: DC

## 2018-12-10 NOTE — Progress Notes (Unsigned)
Sent allopurinol

## 2018-12-15 ENCOUNTER — Ambulatory Visit (INDEPENDENT_AMBULATORY_CARE_PROVIDER_SITE_OTHER): Payer: Medicare Other | Admitting: Vascular Surgery

## 2018-12-23 DIAGNOSIS — E782 Mixed hyperlipidemia: Secondary | ICD-10-CM | POA: Diagnosis not present

## 2018-12-27 DIAGNOSIS — G5602 Carpal tunnel syndrome, left upper limb: Secondary | ICD-10-CM | POA: Diagnosis not present

## 2019-01-12 DIAGNOSIS — H2513 Age-related nuclear cataract, bilateral: Secondary | ICD-10-CM | POA: Diagnosis not present

## 2019-01-12 DIAGNOSIS — H43812 Vitreous degeneration, left eye: Secondary | ICD-10-CM | POA: Diagnosis not present

## 2019-01-25 DIAGNOSIS — R52 Pain, unspecified: Secondary | ICD-10-CM | POA: Diagnosis not present

## 2019-05-17 ENCOUNTER — Encounter: Payer: Self-pay | Admitting: Family Medicine

## 2019-05-17 ENCOUNTER — Ambulatory Visit (INDEPENDENT_AMBULATORY_CARE_PROVIDER_SITE_OTHER): Payer: Medicare Other | Admitting: Family Medicine

## 2019-05-17 ENCOUNTER — Other Ambulatory Visit: Payer: Self-pay

## 2019-05-17 ENCOUNTER — Telehealth: Payer: Self-pay

## 2019-05-17 VITALS — BP 128/62 | HR 68 | Ht 64.0 in | Wt 179.0 lb

## 2019-05-17 DIAGNOSIS — M519 Unspecified thoracic, thoracolumbar and lumbosacral intervertebral disc disorder: Secondary | ICD-10-CM | POA: Diagnosis not present

## 2019-05-17 DIAGNOSIS — H8309 Labyrinthitis, unspecified ear: Secondary | ICD-10-CM

## 2019-05-17 DIAGNOSIS — B379 Candidiasis, unspecified: Secondary | ICD-10-CM

## 2019-05-17 DIAGNOSIS — J4520 Mild intermittent asthma, uncomplicated: Secondary | ICD-10-CM | POA: Diagnosis not present

## 2019-05-17 DIAGNOSIS — M25571 Pain in right ankle and joints of right foot: Secondary | ICD-10-CM

## 2019-05-17 DIAGNOSIS — E790 Hyperuricemia without signs of inflammatory arthritis and tophaceous disease: Secondary | ICD-10-CM

## 2019-05-17 DIAGNOSIS — R079 Chest pain, unspecified: Secondary | ICD-10-CM | POA: Diagnosis not present

## 2019-05-17 MED ORDER — NYSTATIN 100000 UNIT/GM EX CREA: 1.0000 "application " | TOPICAL_CREAM | Freq: Two times a day (BID) | CUTANEOUS | 0 refills | Status: DC

## 2019-05-17 MED ORDER — MELOXICAM 15 MG PO TABS: ORAL_TABLET | ORAL | 1 refills | Status: DC

## 2019-05-17 MED ORDER — MECLIZINE HCL 25 MG PO TABS: 25.0000 mg | ORAL_TABLET | Freq: Three times a day (TID) | ORAL | 5 refills | Status: DC | PRN

## 2019-05-17 MED ORDER — ALBUTEROL SULFATE HFA 108 (90 BASE) MCG/ACT IN AERS: 2.0000 | INHALATION_SPRAY | Freq: Four times a day (QID) | RESPIRATORY_TRACT | 1 refills | Status: DC | PRN

## 2019-05-17 MED ORDER — ALLOPURINOL 100 MG PO TABS: ORAL_TABLET | ORAL | 1 refills | Status: DC

## 2019-05-17 NOTE — Progress Notes (Signed)
Date:  05/17/2019   Name:  Kiara Becker   DOB:  06/07/1945   MRN:  [id number]   Chief Complaint: Asthma, Gout, Knee Pain (takes meloxicam), and Dizziness (takes meclizine as needed)  Asthma She complains of wheezing. There is no chest tightness, cough, difficulty breathing, frequent throat clearing, hemoptysis, hoarse voice, shortness of breath or sputum production. This is a chronic problem. The current episode started more than 1 year ago. The problem occurs daily. The problem has been gradually improving. Associated symptoms include chest pain. Pertinent negatives include no appetite change, dyspnea on exertion, ear congestion, ear pain, fever, headaches, heartburn, malaise/fatigue, myalgias, nasal congestion, orthopnea, PND, postnasal drip, rhinorrhea, sneezing, sore throat, sweats, trouble swallowing or weight loss. Her symptoms are aggravated by nothing. Her symptoms are alleviated by beta-agonist. She reports moderate improvement on treatment. Her past medical history is significant for asthma.  Knee Pain  The incident occurred more than 1 week ago. The pain is present in the right knee and left knee. The quality of the pain is described as aching. The pain is mild. The pain has been intermittent since onset. Pertinent negatives include no numbness. The symptoms are aggravated by palpation. The treatment provided moderate relief.  Dizziness This is a chronic problem. The current episode started more than 1 year ago. The problem has been waxing and waning. Associated symptoms include chest pain. Pertinent negatives include no abdominal pain, arthralgias, chills, congestion, coughing, diaphoresis, fatigue, fever, headaches, joint swelling, myalgias, nausea, numbness, rash, sore throat, vomiting or weakness. The treatment provided moderate relief.  Chest Pain  This is a new problem. The current episode started more than 1 month ago (most recent). The onset quality is sudden. The pain is present  in the substernal region. The pain does not radiate. Associated symptoms include dizziness and exertional chest pressure. Pertinent negatives include no abdominal pain, back pain, cough, diaphoresis, fever, headaches, hemoptysis, malaise/fatigue, nausea, numbness, palpitations, PND, shortness of breath, sputum production, vomiting or weakness.    Lab Results  Component Value Date   CREATININE 1.03 (H) 08/17/2017   BUN 26 08/17/2017   NA 141 08/17/2017   K 3.6 08/17/2017   CL 97 08/17/2017   CO2 24 08/17/2017   Lab Results  Component Value Date   CHOL 175 08/17/2017   HDL 35 (L) 08/17/2017   LDLCALC 79 08/17/2017   TRIG 306 (H) 08/17/2017   CHOLHDL 5.0 (H) 08/17/2017   Lab Results  Component Value Date   TSH 3.500 05/01/2015   Lab Results  Component Value Date   HGBA1C 5.7 (H) 12/28/2017   Lab Results  Component Value Date   WBC 10.8 05/01/2015   HGB 13.7 05/01/2015   HCT 41.2 05/01/2015   MCV 86 05/01/2015   PLT 305 05/01/2015   No results found for: ALT, AST, GGT, ALKPHOS, BILITOT   Review of Systems  Constitutional: Negative.  Negative for appetite change, chills, diaphoresis, fatigue, fever, malaise/fatigue, unexpected weight change and weight loss.  HENT: Negative for congestion, ear discharge, ear pain, hoarse voice, postnasal drip, rhinorrhea, sinus pressure, sneezing, sore throat and trouble swallowing.   Eyes: Negative for photophobia, pain, discharge, redness and itching.  Respiratory: Positive for wheezing. Negative for cough, hemoptysis, sputum production, shortness of breath and stridor.   Cardiovascular: Positive for chest pain. Negative for dyspnea on exertion, palpitations, leg swelling and PND.  Gastrointestinal: Negative for abdominal pain, blood in stool, constipation, diarrhea, heartburn, nausea and vomiting.  Endocrine: Negative for cold  intolerance, heat intolerance, polydipsia, polyphagia and polyuria.  Genitourinary: Negative for dysuria, flank  pain, frequency, hematuria, menstrual problem, pelvic pain, urgency, vaginal bleeding and vaginal discharge.  Musculoskeletal: Negative for arthralgias, back pain, joint swelling and myalgias.  Skin: Negative for rash.  Allergic/Immunologic: Negative for environmental allergies and food allergies.  Neurological: Positive for dizziness. Negative for weakness, light-headedness, numbness and headaches.  Hematological: Negative for adenopathy. Does not bruise/bleed easily.  Psychiatric/Behavioral: Negative for dysphoric mood. The patient is not nervous/anxious.     Patient Active Problem List   Diagnosis Date Noted  . Varicose veins of bilateral lower extremities with other complications A999333  . Aortic atherosclerosis ([place]) 09/26/2016  . Bladder neoplasm of uncertain malignant potential 08/22/2016  . Reactive airway disease 08/11/2016  . Benign paroxysmal positional vertigo 08/11/2016  . Moderate asthma with acute exacerbation 11/05/2015  . Bronchiolitis 08/14/2014  . Medicare annual wellness visit, subsequent 07/24/2014  . Adiposity 07/24/2014  . Awareness of heartbeats 07/14/2014  . Beat, premature ventricular 07/14/2014  . Essential (primary) hypertension 06/16/2014  . Mixed hyperlipidemia 06/16/2014  . Midline cystocele 09/21/2013  . Frank hematuria 09/06/2013  . Renal mass, right 09/06/2013  . Urge incontinence 09/06/2013    Allergies  Allergen Reactions  . Choline Fenofibrate Other (See Comments)    Roaring in ears  . Codeine Other (See Comments)    Unknown  . Codeine Sulfate Other (See Comments)  . Ezetimibe Other (See Comments)    Roaring in ears  . Fenofibrate Other (See Comments)    Roaring in ears  . Fenofibric Acid Other (See Comments)    Roaring in ears  . Gemfibrozil Other (See Comments)    Past Surgical History:  Procedure Laterality Date  . CHOLECYSTECTOMY  11/05/2010   Dr. Pat Patrick  . COLECTOMY  10/13/2007   Laparoscopic assisting ascending colectomy  .  COLON SURGERY  2009   remove a mass  . COLONOSCOPY  03/2014   normal- cleared for 5 years- Dr Vira Agar  . GALLBLADDER SURGERY    . HERNIA REPAIR    . KNEE SURGERY Bilateral    menisacal repair and then replacement  . SKIN SURGERY     removal melanoma on arm and eye  . VAGINAL HYSTERECTOMY  1984    Social History   Tobacco Use  . Smoking status: Never Smoker  . Smokeless tobacco: Never Used  . Tobacco comment: smoking cessation materials not required  Substance Use Topics  . Alcohol use: No    Alcohol/week: 0.0 standard drinks  . Drug use: No     Medication list has been reviewed and updated.  Current Meds  Medication Sig  . albuterol (PROVENTIL HFA;VENTOLIN HFA) 108 (90 Base) MCG/ACT inhaler Inhale 2 puffs into the lungs every 6 (six) hours as needed for wheezing or shortness of breath.  . allopurinol (ZYLOPRIM) 100 MG tablet TAKE 1 TABLET(100 MG) BY MOUTH DAILY  . Calcium Carbonate-Vitamin D (CALTRATE 600+D PO) Take 1 tablet by mouth daily.  Marland Kitchen lovastatin (MEVACOR) 20 MG tablet Take by mouth.  . meclizine (ANTIVERT) 25 MG tablet Take 1 tablet (25 mg total) by mouth 3 (three) times daily as needed for dizziness.  . meloxicam (MOBIC) 15 MG tablet TAKE 1 TABLET BY MOUTH DAILY  . Red Yeast Rice 600 MG CAPS Take by mouth.  . torsemide (DEMADEX) 10 MG tablet Take 10 mg by mouth as needed (twice a week as needed for ankle swelling). Dr Nehemiah Massed  . triamterene-hydrochlorothiazide (MAXZIDE-25) 37.5-25 MG tablet Take  0.5 tablets by mouth as directed. 3 times qweek- Dr Nehemiah Massed   Current Facility-Administered Medications for the 05/17/19 encounter (Office Visit) with Juline Patch, MD  Medication  . albuterol (PROVENTIL) (2.5 MG/3ML) 0.083% nebulizer solution 2.5 mg    PHQ 2/9 Scores 05/17/2019 11/15/2018 08/30/2018 06/02/2018  PHQ - 2 Score 0 0 0 1  PHQ- 9 Score 0 0 - -    BP Readings from Last 3 Encounters:  05/17/19 128/62  11/15/18 120/70  10/15/18 138/64    Physical  Exam Vitals and nursing note reviewed.  Constitutional:      Appearance: She is well-developed.  HENT:     Head: Normocephalic.     Right Ear: Tympanic membrane, ear canal and external ear normal. There is no impacted cerumen.     Left Ear: Tympanic membrane, ear canal and external ear normal. There is no impacted cerumen.     Nose: Nose normal. No congestion or rhinorrhea.     Mouth/Throat:     Mouth: Mucous membranes are moist.  Eyes:     General: Lids are everted, no foreign bodies appreciated. No scleral icterus.       Left eye: No foreign body or hordeolum.     Conjunctiva/sclera: Conjunctivae normal.     Right eye: Right conjunctiva is not injected.     Left eye: Left conjunctiva is not injected.     Pupils: Pupils are equal, round, and reactive to light.  Neck:     Thyroid: No thyromegaly.     Vascular: No JVD.     Trachea: No tracheal deviation.  Cardiovascular:     Rate and Rhythm: Normal rate and regular rhythm.     Pulses: Normal pulses.     Heart sounds: Normal heart sounds. No murmur. No friction rub. No gallop.   Pulmonary:     Effort: Pulmonary effort is normal. No respiratory distress.     Breath sounds: Normal breath sounds. No wheezing, rhonchi or rales.  Abdominal:     General: Bowel sounds are normal.     Palpations: Abdomen is soft. There is no mass.     Tenderness: There is no abdominal tenderness. There is no guarding or rebound.  Musculoskeletal:        General: No tenderness. Normal range of motion.     Cervical back: Normal range of motion and neck supple.  Lymphadenopathy:     Cervical: No cervical adenopathy.  Skin:    General: Skin is warm.     Findings: No rash.  Neurological:     Mental Status: She is alert and oriented to person, place, and time.     Cranial Nerves: No cranial nerve deficit.     Deep Tendon Reflexes: Reflexes normal.  Psychiatric:        Mood and Affect: Mood is not anxious or depressed.     Wt Readings from Last 3  Encounters:  05/17/19 179 lb (81.2 kg)  11/15/18 181 lb (82.1 kg)  10/15/18 184 lb (83.5 kg)    BP 128/62   Pulse 68   Ht 5\' 4"  (1.626 m)   Wt 179 lb (81.2 kg)   BMI 30.73 kg/m   Assessment and Plan:  1. Exertional chest pain New onset.  Approximately a month ago patient had exertional chest pain while walking and did not think she was going to be able to make it back to the house.  Since it was associated with some dyspnea she stopped and used her  inhaler and the pain went away I rather concerned that it was not the fact that she took the beta agonist but she stopped to take it that the pain went away.  Patient will be calling her cardiologist for recheck it has been over 10 years that she recalls from her last stress test and this may be a a possible concern.  2. Reactive airway disease, mild intermittent, uncomplicated Chronic.  Controlled.  Stable.  Continue albuterol inhaler 2 puffs every 6 hours as needed wheezing or shortness of breath - albuterol (VENTOLIN HFA) 108 (90 Base) MCG/ACT inhaler; Inhale 2 puffs into the lungs every 6 (six) hours as needed for wheezing or shortness of breath.  Dispense: 18 g; Refill: 1  3. Hyperuricemia Chronic.  Controlled.  Stable.  Patient had an episode and her foot which went all the way up into her leg told her this was unlikely a gout attack but would continue the albuterol 100 mg daily. - allopurinol (ZYLOPRIM) 100 MG tablet; TAKE 1 TABLET(100 MG) BY MOUTH DAILY  Dispense: 90 tablet; Refill: 1  4. Labyrinthitis, unspecified laterality Chronic.  Controlled.  Stable.  Continue meclizine 25 mg as needed as needed vertigo. - meclizine (ANTIVERT) 25 MG tablet; Take 1 tablet (25 mg total) by mouth 3 (three) times daily as needed for dizziness.  Dispense: 30 tablet; Refill: 5  5. Thoracic disc disease Chronic.  Controlled.  Stable.  Patient uses meloxicam 15 mg daily as needed knee pain. - meloxicam (MOBIC) 15 MG tablet; TAKE 1 TABLET BY MOUTH  DAILY  Dispense: 90 tablet; Refill: 1  6. Candidiasis New onset patient has had redness beneath her pannus now during the warmer months.  This is consistent with a candidiasis and patient has been using Goldbond.  Patient is going to stop the Goldbond cream and replace it with nystatin cream, suspect this is a candidiasis concern. - nystatin cream (MYCOSTATIN); Apply 1 application topically 2 (two) times daily.  Dispense: 30 g; Refill: 0  7. Acute right ankle pain likely peroneal tendinopathy. New onset of the lateral aspect of her right ankle.  Patient's had a palpable snap at times.  I suspect this is a peroneal tendinopathy and patient has been given information concerning this.

## 2019-05-17 NOTE — Patient Instructions (Signed)
Peroneal Tendinopathy  Peroneal tendinopathy is irritation of the tendons that pass behind your ankle (peroneal tendons). These tendons attach muscles in your foot to a bone on the side of your foot and underneath the arch of your foot. This condition can cause your peroneal tendons to get bigger and swell. What are the causes? This condition may be caused by:  Putting stress on your ankle over and over again (overuse injury).  A sudden injury that puts stress on your tendons, such as an ankle sprain. What increases the risk? You are more likely to develop this condition if you:  Have high arches.  Play sports that involve putting stress on the ankle over and over again. These sports include: ? Running. ? Dancing. ? Soccer. ? Basketball. What are the signs or symptoms? Symptoms of this condition can start suddenly or develop gradually. Symptoms of this condition include:  Pain in the back of the ankle, on the side of the foot, or in the arch of the foot.  Pain that gets worse with activity and better with rest.  Swelling.  Warmth.  Weakness in your foot or ankle. How is this diagnosed? This condition may be diagnosed based on:  Your symptoms.  Your medical history.  A physical exam. During the exam, your health care provider may move your foot and ankle and test the strength of your leg muscles.  Imaging tests, such as: ? X-rays or a CT scan to check for bone injury. ? MRI or ultrasound to check for muscle or tendon injury. How is this treated? This condition may be treated by:  Keeping your body weight off your ankle for several days.  Returning to full activity gradually.  Putting ice on your ankle to reduce swelling.  Taking NSAIDs, such as ibuprofen.  Having medicine injected into your tendon to reduce swelling.  Wearing a removable boot or brace for ankle support.  Doing range-of-motion exercises and strengthening exercises (physical therapy) when pain  and swelling improve. If the condition does not improve with treatment, or if a tendon or muscle is damaged, surgery may be needed. Follow these instructions at home: If you have a boot or brace:  Wear the boot or brace as told by your health care provider. Remove it only as told by your health care provider.  Loosen the boot or brace if your toes tingle, become numb, or turn cold and blue.  Keep the boot or brace clean.  If the boot or brace is not waterproof: ? Do not let it get wet. ? Cover it with a watertight covering when you take a bath or shower. Managing pain, stiffness, and swelling   If directed, put ice on the injured area. ? If you have a removable boot or brace, remove it as told by your health care provider. ? Put ice in a plastic bag. ? Place a towel between your skin and the bag. ? Leave the ice on for 20 minutes, 2-3 times a day.  Move your toes often to reduce stiffness and swelling.  Raise (elevate) your ankle above the level of your heart while you are sitting or lying down. Activity  Do not do activities that make pain or swelling worse.  Do exercises as told by your health care provider.  Return to your normal activities as told by your health care provider. Ask your health care provider what activities are safe for you.  Ask your health care provider when it is safe to drive  if you have a boot or brace on your foot. General instructions  Take over-the-counter and prescription medicines only as told by your health care provider.  Do not use any products that contain nicotine or tobacco, such as cigarettes, e-cigarettes, and chewing tobacco. These can delay healing. If you need help quitting, ask your health care provider.  Keep all follow-up visits as told by your health care provider. This is important. How is this prevented?  Wear supportive footwear that is appropriate for your athletic activity.  Avoid athletic activities that cause swelling or  pain in your ankle or foot.  See your health care provider if you have pain or swelling that does not improve after a few days of rest.  Stop training if you develop pain or swelling.  If you start a new athletic activity, start gradually to build up your strength, endurance, and flexibility.  Warm up and stretch before being active.  Cool down and stretch after being active. Contact a health care provider if:  Your symptoms get worse.  Your symptoms do not improve in 2-4 weeks.  You develop new, unexplained symptoms. Summary  Peroneal tendinopathy is irritation of the tendons that pass behind your ankle.  This condition is caused by overuse or sudden injury to the peroneal tendon.  Symptoms include pain, swelling, warmth, and weakness in your foot or ankle.  This condition is treated with rest, ice, medicines, physical therapy, and surgery if needed. This information is not intended to replace advice given to you by your health care provider. Make sure you discuss any questions you have with your health care provider. Document Revised: 05/13/2018 Document Reviewed: 03/01/2018 Elsevier Patient Education  [place].

## 2019-05-17 NOTE — Telephone Encounter (Signed)
Pt called in to give update on appt with Dr Nehemiah Massed this Thursday and they will schedule a stress test if needed per pt.

## 2019-05-18 DIAGNOSIS — R198 Other specified symptoms and signs involving the digestive system and abdomen: Secondary | ICD-10-CM | POA: Diagnosis not present

## 2019-05-18 DIAGNOSIS — Z9049 Acquired absence of other specified parts of digestive tract: Secondary | ICD-10-CM | POA: Diagnosis not present

## 2019-05-18 DIAGNOSIS — Z86010 Personal history of colonic polyps: Secondary | ICD-10-CM | POA: Diagnosis not present

## 2019-05-18 DIAGNOSIS — Z01812 Encounter for preprocedural laboratory examination: Secondary | ICD-10-CM | POA: Diagnosis not present

## 2019-05-19 DIAGNOSIS — I208 Other forms of angina pectoris: Secondary | ICD-10-CM | POA: Insufficient documentation

## 2019-05-19 DIAGNOSIS — R079 Chest pain, unspecified: Secondary | ICD-10-CM | POA: Diagnosis not present

## 2019-05-19 DIAGNOSIS — I10 Essential (primary) hypertension: Secondary | ICD-10-CM | POA: Diagnosis not present

## 2019-05-19 DIAGNOSIS — I700 Atherosclerosis of aorta: Secondary | ICD-10-CM | POA: Diagnosis not present

## 2019-05-19 DIAGNOSIS — E782 Mixed hyperlipidemia: Secondary | ICD-10-CM | POA: Diagnosis not present

## 2019-06-22 DIAGNOSIS — I700 Atherosclerosis of aorta: Secondary | ICD-10-CM | POA: Diagnosis not present

## 2019-06-22 DIAGNOSIS — E782 Mixed hyperlipidemia: Secondary | ICD-10-CM | POA: Diagnosis not present

## 2019-06-22 DIAGNOSIS — I10 Essential (primary) hypertension: Secondary | ICD-10-CM | POA: Diagnosis not present

## 2019-06-22 DIAGNOSIS — I208 Other forms of angina pectoris: Secondary | ICD-10-CM | POA: Diagnosis not present

## 2019-07-21 DIAGNOSIS — Z872 Personal history of diseases of the skin and subcutaneous tissue: Secondary | ICD-10-CM | POA: Diagnosis not present

## 2019-07-21 DIAGNOSIS — Z85820 Personal history of malignant melanoma of skin: Secondary | ICD-10-CM | POA: Diagnosis not present

## 2019-07-21 DIAGNOSIS — L578 Other skin changes due to chronic exposure to nonionizing radiation: Secondary | ICD-10-CM | POA: Diagnosis not present

## 2019-07-21 DIAGNOSIS — L570 Actinic keratosis: Secondary | ICD-10-CM | POA: Diagnosis not present

## 2019-07-21 DIAGNOSIS — Z86018 Personal history of other benign neoplasm: Secondary | ICD-10-CM | POA: Diagnosis not present

## 2019-07-21 DIAGNOSIS — Z859 Personal history of malignant neoplasm, unspecified: Secondary | ICD-10-CM | POA: Diagnosis not present

## 2019-08-26 ENCOUNTER — Other Ambulatory Visit: Payer: Self-pay | Admitting: Family Medicine

## 2019-08-26 DIAGNOSIS — Z1231 Encounter for screening mammogram for malignant neoplasm of breast: Secondary | ICD-10-CM

## 2019-08-29 DIAGNOSIS — K760 Fatty (change of) liver, not elsewhere classified: Secondary | ICD-10-CM | POA: Diagnosis not present

## 2019-08-29 DIAGNOSIS — R9341 Abnormal radiologic findings on diagnostic imaging of renal pelvis, ureter, or bladder: Secondary | ICD-10-CM | POA: Diagnosis not present

## 2019-08-29 DIAGNOSIS — N200 Calculus of kidney: Secondary | ICD-10-CM | POA: Diagnosis not present

## 2019-08-29 DIAGNOSIS — N2889 Other specified disorders of kidney and ureter: Secondary | ICD-10-CM | POA: Diagnosis not present

## 2019-09-05 ENCOUNTER — Other Ambulatory Visit: Payer: Self-pay

## 2019-09-05 ENCOUNTER — Ambulatory Visit (INDEPENDENT_AMBULATORY_CARE_PROVIDER_SITE_OTHER): Payer: Medicare Other

## 2019-09-05 VITALS — BP 114/70 | HR 72 | Temp 98.2°F | Resp 16 | Ht 64.0 in | Wt 178.8 lb

## 2019-09-05 DIAGNOSIS — Z0000 Encounter for general adult medical examination without abnormal findings: Secondary | ICD-10-CM

## 2019-09-05 NOTE — Progress Notes (Signed)
Subjective:   Kiara Becker is a 74 y.o. female who presents for Medicare Annual (Subsequent) preventive examination.  Review of Systems     Cardiac Risk Factors include: advanced age (>24men, >53 women);obesity (BMI >30kg/m2);hypertension     Objective:    Today's Vitals   09/05/19 1132  BP: 114/70  Pulse: 72  Resp: 16  Temp: 98.2 F (36.8 C)  TempSrc: Oral  SpO2: 99%  Weight: 178 lb 12.8 oz (81.1 kg)  Height: 5\' 4"  (1.626 m)   Body mass index is 30.69 kg/m.  Advanced Directives 09/05/2019 08/30/2018 08/05/2017 08/04/2016 12/20/2015 10/23/2014 07/24/2014  Does Patient Have a Medical Advance Directive? No No No No No No No  Would patient like information on creating a medical advance directive? No - Patient declined No - Patient declined Yes (MAU/Ambulatory/Procedural Areas - Information given) Yes (MAU/Ambulatory/Procedural Areas - Information given) - No - patient declined information No - patient declined information    Current Medications (verified) Outpatient Encounter Medications as of 09/05/2019  Medication Sig  . albuterol (VENTOLIN HFA) 108 (90 Base) MCG/ACT inhaler Inhale 2 puffs into the lungs every 6 (six) hours as needed for wheezing or shortness of breath.  . allopurinol (ZYLOPRIM) 100 MG tablet TAKE 1 TABLET(100 MG) BY MOUTH DAILY  . ascorbic acid (VITAMIN C) 1000 MG tablet Take 1 tablet by mouth daily.  Marland Kitchen aspirin 81 MG EC tablet Take 1 tablet by mouth daily.  . Calcium Carbonate-Vitamin D (CALTRATE 600+D PO) Take 1 tablet by mouth daily.  Marland Kitchen lovastatin (MEVACOR) 20 MG tablet Take by mouth.  . meclizine (ANTIVERT) 25 MG tablet Take 1 tablet (25 mg total) by mouth 3 (three) times daily as needed for dizziness.  . meloxicam (MOBIC) 15 MG tablet TAKE 1 TABLET BY MOUTH DAILY  . torsemide (DEMADEX) 10 MG tablet Take 10 mg by mouth as needed (twice a week as needed for ankle swelling). Dr Nehemiah Massed  . triamterene-hydrochlorothiazide (MAXZIDE-25) 37.5-25 MG tablet Take 0.5  tablets by mouth as directed. 3 times qweek- Dr Nehemiah Massed  . [DISCONTINUED] nystatin cream (MYCOSTATIN) Apply 1 application topically 2 (two) times daily.  . [DISCONTINUED] Red Yeast Rice 600 MG CAPS Take by mouth.   Facility-Administered Encounter Medications as of 09/05/2019  Medication  . albuterol (PROVENTIL) (2.5 MG/3ML) 0.083% nebulizer solution 2.5 mg    Allergies (verified) Codeine sulfate, Ezetimibe, Fenofibrate, Fenofibric acid, and Gemfibrozil   History: Past Medical History:  Diagnosis Date  . Allergy   . Asthma   . Cancer ([place]) 2001   melanoma  . Colon cancer ([place]) 2009  . Hyperlipidemia   . Hypertension   . Ventral hernia    Past Surgical History:  Procedure Laterality Date  . CARPAL TUNNEL RELEASE Bilateral   . CHOLECYSTECTOMY  11/05/2010   Dr. Pat Patrick  . COLECTOMY  10/13/2007   Laparoscopic assisting ascending colectomy  . COLON SURGERY  2009   remove a mass  . COLONOSCOPY  03/2014   normal- cleared for 5 years- Dr Vira Agar  . GALLBLADDER SURGERY    . HERNIA REPAIR    . KNEE SURGERY Bilateral    menisacal repair and then replacement  . SKIN SURGERY     removal melanoma on arm and eye  . TRIGGER FINGER RELEASE Left   . VAGINAL HYSTERECTOMY  1984   Family History  Problem Relation Age of Onset  . Heart disease Mother   . Liver cancer Mother   . Heart disease Father   . Heart  disease Brother   . Drug abuse Maternal Grandfather    Social History   Socioeconomic History  . Marital status: Married    Spouse name: Not on file  . Number of children: 2  . Years of education: some college  . Highest education level: 12th grade  Occupational History  . Occupation: Retired  Tobacco Use  . Smoking status: Never Smoker  . Smokeless tobacco: Never Used  . Tobacco comment: smoking cessation materials not required  Vaping Use  . Vaping Use: Never used  Substance and Sexual Activity  . Alcohol use: No    Alcohol/week: 0.0 standard drinks  . Drug use: No  .  Sexual activity: Yes    Birth control/protection: None  Other Topics Concern  . Not on file  Social History Narrative  . Not on file   Social Determinants of Health   Financial Resource Strain: Low Risk   . Difficulty of Paying Living Expenses: Not hard at all  Food Insecurity: No Food Insecurity  . Worried About Charity fundraiser in the Last Year: Never true  . Ran Out of Food in the Last Year: Never true  Transportation Needs: No Transportation Needs  . Lack of Transportation (Medical): No  . Lack of Transportation (Non-Medical): No  Physical Activity: Inactive  . Days of Exercise per Week: 0 days  . Minutes of Exercise per Session: 0 min  Stress: No Stress Concern Present  . Feeling of Stress : Not at all  Social Connections: Moderately Integrated  . Frequency of Communication with Friends and Family: More than three times a week  . Frequency of Social Gatherings with Friends and Family: Three times a week  . Attends Religious Services: More than 4 times per year  . Active Member of Clubs or Organizations: No  . Attends Archivist Meetings: Never  . Marital Status: Married    Tobacco Counseling Counseling given: Not Answered Comment: smoking cessation materials not required   Clinical Intake:  Pre-visit preparation completed: Yes  Pain : No/denies pain     BMI - recorded: 30.69 Nutritional Status: BMI > 30  Obese Nutritional Risks: None Diabetes: No  How often do you need to have someone help you when you read instructions, pamphlets, or other written materials from your doctor or pharmacy?: 1 - Never    Interpreter Needed?: No  Information entered by :: Clemetine Marker LPN   Activities of Daily Living In your present state of health, do you have any difficulty performing the following activities: 09/05/2019  Hearing? Y  Comment wears hearing aids  Vision? N  Difficulty concentrating or making decisions? N  Walking or climbing stairs? N    Dressing or bathing? N  Doing errands, shopping? N  Preparing Food and eating ? N  Using the Toilet? N  In the past six months, have you accidently leaked urine? N  Do you have problems with loss of bowel control? N  Managing your Medications? N  Managing your Finances? N  Housekeeping or managing your Housekeeping? N  Some recent data might be hidden    Patient Care Team: Juline Patch, MD as PCP - General (Family Medicine) Jannet Mantis, MD (Dermatology) Corey Skains, MD as Consulting Physician (Cardiology)  Indicate any recent Medical Services you may have received from other than [facility] providers in the past year (date may be approximate).     Assessment:   This is a routine wellness examination for [place].  Hearing/Vision screen  Hearing Screening   125Hz  250Hz  500Hz  1000Hz  2000Hz  3000Hz  4000Hz  6000Hz  8000Hz   Right ear:           Left ear:           Comments:  Pt wears hearing aids maintained by [employer] [facility] in [place]  Vision Screening Comments: Annual vision screenings by Dr. Atilano Median  Dietary issues and exercise activities discussed: Current Exercise Habits: The patient does not participate in regular exercise at present, Exercise limited by: respiratory conditions(s)  Goals    . DIET - INCREASE WATER INTAKE     Recommend to drink at least 6-8 8oz glasses of water per day.    . Weight (lb) < 160 lb (72.6 kg)     Pt would like to lose weight with healthy eating and physical activity       Depression Screen PHQ 2/9 Scores 09/05/2019 05/17/2019 11/15/2018 08/30/2018 06/02/2018 08/05/2017 08/04/2016  PHQ - 2 Score 0 0 0 0 1 0 0  PHQ- 9 Score - 0 0 - - 0 -    Fall Risk Fall Risk  09/05/2019 05/17/2019 08/30/2018 06/02/2018 08/05/2017  Falls in the past year? 0 0 0 0 No  Number falls in past yr: 0 - 0 0 -  Injury with Fall? 0 - 0 0 -  Risk for fall due to : No Fall Risks - - - Impaired vision  Follow up Falls prevention discussed Falls evaluation completed Falls  prevention discussed Falls evaluation completed -    Any stairs in or around the home? Yes  If so, are there any without handrails? No  Home free of loose throw rugs in walkways, pet beds, electrical cords, etc? Yes  Adequate lighting in your home to reduce risk of falls? Yes   ASSISTIVE DEVICES UTILIZED TO PREVENT FALLS:  Life alert? No  Use of a cane, walker or w/c? No  Grab bars in the bathroom? No  Shower chair or bench in shower? Yes  Elevated toilet seat or a handicapped toilet? Yes   TIMED UP AND GO:  Was the test performed? Yes .  Length of time to ambulate 10 feet: 5 sec.   Gait steady and fast without use of assistive device  Cognitive Function:     6CIT Screen 09/05/2019 08/30/2018 08/05/2017 08/04/2016  What Year? 0 points 0 points 0 points 0 points  What month? 0 points 0 points 0 points 0 points  What time? 0 points 0 points 0 points 0 points  Count back from 20 0 points 0 points 0 points 0 points  Months in reverse 0 points 0 points 0 points 0 points  Repeat phrase 0 points 0 points 0 points 2 points  Total Score 0 0 0 2    Immunizations Immunization History  Administered Date(s) Administered  . Fluad Quad(high Dose 65+) 11/15/2018  . Influenza, High Dose Seasonal PF 10/30/2017  . Influenza,inj,Quad PF,6+ Mos 12/04/2014  . Influenza-Unspecified 12/16/2016  . PFIZER SARS-COV-2 Vaccination 03/16/2019, 04/06/2019  . Pneumococcal Conjugate-13 07/25/2015  . Pneumococcal Polysaccharide-23 06/24/2012  . Tdap 06/24/2012  . Zoster 06/04/2014    TDAP status: Up to date   Flu Vaccine status: Up to date   Pneumococcal vaccine status: Up to date   Covid-19 vaccine status: Completed vaccines  Qualifies for Shingles Vaccine? Yes   Zostavax completed Yes   Shingrix Completed?: No.    Education has been provided regarding the importance of this vaccine. Patient has been advised to call  insurance company to determine out of pocket expense if they have not yet  received this vaccine. Advised may also receive vaccine at local pharmacy or Health Dept. Verbalized acceptance and understanding.  Screening Tests Health Maintenance  Topic Date Due  . INFLUENZA VACCINE  09/04/2019  . MAMMOGRAM  09/07/2019  . COLONOSCOPY  03/06/2020  . TETANUS/TDAP  06/25/2022  . DEXA SCAN  Completed  . COVID-19 Vaccine  Completed  . Hepatitis C Screening  Completed  . PNA vac Low Risk Adult  Completed    Health Maintenance  Health Maintenance Due  Topic Date Due  . INFLUENZA VACCINE  09/04/2019    Colorectal cancer screening: Completed 03/07/15. Repeat every 5 years. Scheduled for 10/12/19  Mammogram status: Completed 09/07/18. Repeat every year. Scheduled for 11/23/19  Bone Density status: Completed 08/25/16. Results reflect: Bone density results: NORMAL. Repeat every 2 years. Pt to postpone repeat screening at this time.   Lung Cancer Screening: (Low Dose CT Chest recommended if Age 36-80 years, 30 pack-year currently smoking OR have quit w/in 15years.) does not qualify.   Additional Screening:  Hepatitis C Screening: does qualify; Completed 08/11/16  Vision Screening: Recommended annual ophthalmology exams for early detection of glaucoma and other disorders of the eye. Is the patient up to date with their annual eye exam?  Yes  Who is the provider or what is the name of the office in which the patient attends annual eye exams? Dr. Atilano Median  Dental Screening: Recommended annual dental exams for proper oral hygiene  Community Resource Referral / Chronic Care Management: CRR required this visit?  No   CCM required this visit?  No      Plan:     I have personally reviewed and noted the following in the patient's chart:   . Medical and social history . Use of alcohol, tobacco or illicit drugs  . Current medications and supplements . Functional ability and status . Nutritional status . Physical activity . Advanced directives . List of other  physicians . Hospitalizations, surgeries, and ER visits in previous 12 months . Vitals . Screenings to include cognitive, depression, and falls . Referrals and appointments  In addition, I have reviewed and discussed with patient certain preventive protocols, quality metrics, and best practice recommendations. A written personalized care plan for preventive services as well as general preventive health recommendations were provided to patient.     Clemetine Marker, LPN   06/09/3891   Nurse Notes: pt doing well and appreciative of visit today

## 2019-09-05 NOTE — Patient Instructions (Signed)
Kiara Becker , Thank you for taking time to come for your Medicare Wellness Visit. I appreciate your ongoing commitment to your health goals. Please review the following plan we discussed and let me know if I can assist you in the future.   Screening recommendations/referrals: Colonoscopy: done 03/07/15. Scheduled for 10/12/19 Mammogram: done 09/07/18. Scheduled for 11/23/19 Bone Density: done 08/25/16 Recommended yearly ophthalmology/optometry visit for glaucoma screening and checkup Recommended yearly dental visit for hygiene and checkup  Vaccinations: Influenza vaccine: done 11/15/18 Pneumococcal vaccine: done 07/25/15 Tdap vaccine: done 06/24/12 Shingles vaccine: Shingrix discussed. Please contact your pharmacy for coverage information.  Covid-19: done 03/16/19 7 04/06/19  Advanced directives: Please bring a copy of your health care power of attorney and living will to the office at your convenience once you have completed those documents.   Conditions/risks identified: Recommend healthy eating and physical activity for desired weight loss  Next appointment: Follow up in one year for your annual wellness visit    Preventive Care 65 Years and Older, Female Preventive care refers to lifestyle choices and visits with your health care provider that can promote health and wellness. What does preventive care include?  A yearly physical exam. This is also called an annual well check.  Dental exams once or twice a year.  Routine eye exams. Ask your health care provider how often you should have your eyes checked.  Personal lifestyle choices, including:  Daily care of your teeth and gums.  Regular physical activity.  Eating a healthy diet.  Avoiding tobacco and drug use.  Limiting alcohol use.  Practicing safe sex.  Taking low-dose aspirin every day.  Taking vitamin and mineral supplements as recommended by your health care provider. What happens during an annual well check? The  services and screenings done by your health care provider during your annual well check will depend on your age, overall health, lifestyle risk factors, and family history of disease. Counseling  Your health care provider may ask you questions about your:  Alcohol use.  Tobacco use.  Drug use.  Emotional well-being.  Home and relationship well-being.  Sexual activity.  Eating habits.  History of falls.  Memory and ability to understand (cognition).  Work and work Statistician.  Reproductive health. Screening  You may have the following tests or measurements:  Height, weight, and BMI.  Blood pressure.  Lipid and cholesterol levels. These may be checked every 5 years, or more frequently if you are over 28 years old.  Skin check.  Lung cancer screening. You may have this screening every year starting at age 21 if you have a 30-pack-year history of smoking and currently smoke or have quit within the past 15 years.  Fecal occult blood test (FOBT) of the stool. You may have this test every year starting at age 48.  Flexible sigmoidoscopy or colonoscopy. You may have a sigmoidoscopy every 5 years or a colonoscopy every 10 years starting at age 80.  Hepatitis C blood test.  Hepatitis B blood test.  Sexually transmitted disease (STD) testing.  Diabetes screening. This is done by checking your blood sugar (glucose) after you have not eaten for a while (fasting). You may have this done every 1-3 years.  Bone density scan. This is done to screen for osteoporosis. You may have this done starting at age 33.  Mammogram. This may be done every 1-2 years. Talk to your health care provider about how often you should have regular mammograms. Talk with your health care provider  about your test results, treatment options, and if necessary, the need for more tests. Vaccines  Your health care provider may recommend certain vaccines, such as:  Influenza vaccine. This is recommended  every year.  Tetanus, diphtheria, and acellular pertussis (Tdap, Td) vaccine. You may need a Td booster every 10 years.  Zoster vaccine. You may need this after age 100.  Pneumococcal 13-valent conjugate (PCV13) vaccine. One dose is recommended after age 58.  Pneumococcal polysaccharide (PPSV23) vaccine. One dose is recommended after age 51. Talk to your health care provider about which screenings and vaccines you need and how often you need them. This information is not intended to replace advice given to you by your health care provider. Make sure you discuss any questions you have with your health care provider. Document Released: 02/16/2015 Document Revised: 10/10/2015 Document Reviewed: 11/21/2014 Elsevier Interactive Patient Education  2017 [employer] Prevention in the Home Falls can cause injuries. They can happen to people of all ages. There are many things you can do to make your home safe and to help prevent falls. What can I do on the outside of my home?  Regularly fix the edges of walkways and driveways and fix any cracks.  Remove anything that might make you trip as you walk through a door, such as a raised step or threshold.  Trim any bushes or trees on the path to your home.  Use bright outdoor lighting.  Clear any walking paths of anything that might make someone trip, such as rocks or tools.  Regularly check to see if handrails are loose or broken. Make sure that both sides of any steps have handrails.  Any raised decks and porches should have guardrails on the edges.  Have any leaves, snow, or ice cleared regularly.  Use sand or salt on walking paths during winter.  Clean up any spills in your garage right away. This includes oil or grease spills. What can I do in the bathroom?  Use night lights.  Install grab bars by the toilet and in the tub and shower. Do not use towel bars as grab bars.  Use non-skid mats or decals in the tub or shower.  If  you need to sit down in the shower, use a plastic, non-slip stool.  Keep the floor dry. Clean up any water that spills on the floor as soon as it happens.  Remove soap buildup in the tub or shower regularly.  Attach bath mats securely with double-sided non-slip rug tape.  Do not have throw rugs and other things on the floor that can make you trip. What can I do in the bedroom?  Use night lights.  Make sure that you have a light by your bed that is easy to reach.  Do not use any sheets or blankets that are too big for your bed. They should not hang down onto the floor.  Have a firm chair that has side arms. You can use this for support while you get dressed.  Do not have throw rugs and other things on the floor that can make you trip. What can I do in the kitchen?  Clean up any spills right away.  Avoid walking on wet floors.  Keep items that you use a lot in easy-to-reach places.  If you need to reach something above you, use a strong step stool that has a grab bar.  Keep electrical cords out of the way.  Do not use floor polish or  wax that makes floors slippery. If you must use wax, use non-skid floor wax.  Do not have throw rugs and other things on the floor that can make you trip. What can I do with my stairs?  Do not leave any items on the stairs.  Make sure that there are handrails on both sides of the stairs and use them. Fix handrails that are broken or loose. Make sure that handrails are as long as the stairways.  Check any carpeting to make sure that it is firmly attached to the stairs. Fix any carpet that is loose or worn.  Avoid having throw rugs at the top or bottom of the stairs. If you do have throw rugs, attach them to the floor with carpet tape.  Make sure that you have a light switch at the top of the stairs and the bottom of the stairs. If you do not have them, ask someone to add them for you. What else can I do to help prevent falls?  Wear shoes  that:  Do not have high heels.  Have rubber bottoms.  Are comfortable and fit you well.  Are closed at the toe. Do not wear sandals.  If you use a stepladder:  Make sure that it is fully opened. Do not climb a closed stepladder.  Make sure that both sides of the stepladder are locked into place.  Ask someone to hold it for you, if possible.  Clearly mark and make sure that you can see:  Any grab bars or handrails.  First and last steps.  Where the edge of each step is.  Use tools that help you move around (mobility aids) if they are needed. These include:  Canes.  Walkers.  Scooters.  Crutches.  Turn on the lights when you go into a dark area. Replace any light bulbs as soon as they burn out.  Set up your furniture so you have a clear path. Avoid moving your furniture around.  If any of your floors are uneven, fix them.  If there are any pets around you, be aware of where they are.  Review your medicines with your doctor. Some medicines can make you feel dizzy. This can increase your chance of falling. Ask your doctor what other things that you can do to help prevent falls. This information is not intended to replace advice given to you by your health care provider. Make sure you discuss any questions you have with your health care provider. Document Released: 11/16/2008 Document Revised: 06/28/2015 Document Reviewed: 02/24/2014 Elsevier Interactive Patient Education  2017 [employer].

## 2019-09-08 DIAGNOSIS — N2889 Other specified disorders of kidney and ureter: Secondary | ICD-10-CM | POA: Diagnosis not present

## 2019-09-14 DIAGNOSIS — E782 Mixed hyperlipidemia: Secondary | ICD-10-CM | POA: Diagnosis not present

## 2019-09-27 DIAGNOSIS — I10 Essential (primary) hypertension: Secondary | ICD-10-CM | POA: Diagnosis not present

## 2019-09-27 DIAGNOSIS — E782 Mixed hyperlipidemia: Secondary | ICD-10-CM | POA: Diagnosis not present

## 2019-09-27 DIAGNOSIS — I700 Atherosclerosis of aorta: Secondary | ICD-10-CM | POA: Diagnosis not present

## 2019-09-27 DIAGNOSIS — I493 Ventricular premature depolarization: Secondary | ICD-10-CM | POA: Diagnosis not present

## 2019-10-07 ENCOUNTER — Other Ambulatory Visit: Admission: RE | Admit: 2019-10-07 | Payer: Medicare Other | Source: Ambulatory Visit

## 2019-10-11 ENCOUNTER — Other Ambulatory Visit
Admission: RE | Admit: 2019-10-11 | Discharge: 2019-10-11 | Disposition: A | Discharge status: Home / Self Care | Payer: Medicare Other | Source: Ambulatory Visit | Attending: Internal Medicine | Admitting: Internal Medicine

## 2019-10-11 ENCOUNTER — Encounter: Payer: Self-pay | Admitting: Internal Medicine

## 2019-10-11 ENCOUNTER — Other Ambulatory Visit: Payer: Self-pay

## 2019-10-11 DIAGNOSIS — Z01812 Encounter for preprocedural laboratory examination: Secondary | ICD-10-CM | POA: Diagnosis not present

## 2019-10-11 DIAGNOSIS — Z20822 Contact with and (suspected) exposure to covid-19: Secondary | ICD-10-CM | POA: Diagnosis not present

## 2019-10-12 ENCOUNTER — Other Ambulatory Visit: Payer: Self-pay

## 2019-10-12 ENCOUNTER — Ambulatory Visit: Payer: Medicare Other | Admitting: Anesthesiology

## 2019-10-12 ENCOUNTER — Encounter
Admission: RE | Disposition: A | Discharge status: Home / Self Care | Payer: Self-pay | Source: Home / Self Care | Attending: Internal Medicine

## 2019-10-12 ENCOUNTER — Ambulatory Visit
Admission: RE | Admit: 2019-10-12 | Discharge: 2019-10-12 | Disposition: A | Discharge status: Home / Self Care | Payer: Medicare Other | Attending: Internal Medicine | Admitting: Internal Medicine

## 2019-10-12 DIAGNOSIS — J45909 Unspecified asthma, uncomplicated: Secondary | ICD-10-CM | POA: Insufficient documentation

## 2019-10-12 DIAGNOSIS — K5730 Diverticulosis of large intestine without perforation or abscess without bleeding: Secondary | ICD-10-CM | POA: Diagnosis not present

## 2019-10-12 DIAGNOSIS — I10 Essential (primary) hypertension: Secondary | ICD-10-CM | POA: Insufficient documentation

## 2019-10-12 DIAGNOSIS — K5790 Diverticulosis of intestine, part unspecified, without perforation or abscess without bleeding: Secondary | ICD-10-CM | POA: Diagnosis not present

## 2019-10-12 DIAGNOSIS — Z888 Allergy status to other drugs, medicaments and biological substances status: Secondary | ICD-10-CM | POA: Diagnosis not present

## 2019-10-12 DIAGNOSIS — K648 Other hemorrhoids: Secondary | ICD-10-CM | POA: Diagnosis not present

## 2019-10-12 DIAGNOSIS — M1990 Unspecified osteoarthritis, unspecified site: Secondary | ICD-10-CM | POA: Insufficient documentation

## 2019-10-12 DIAGNOSIS — Z9049 Acquired absence of other specified parts of digestive tract: Secondary | ICD-10-CM | POA: Diagnosis not present

## 2019-10-12 DIAGNOSIS — Z980 Intestinal bypass and anastomosis status: Secondary | ICD-10-CM | POA: Insufficient documentation

## 2019-10-12 DIAGNOSIS — E785 Hyperlipidemia, unspecified: Secondary | ICD-10-CM | POA: Diagnosis not present

## 2019-10-12 DIAGNOSIS — Z1211 Encounter for screening for malignant neoplasm of colon: Secondary | ICD-10-CM | POA: Insufficient documentation

## 2019-10-12 DIAGNOSIS — Z7982 Long term (current) use of aspirin: Secondary | ICD-10-CM | POA: Insufficient documentation

## 2019-10-12 DIAGNOSIS — Z85820 Personal history of malignant melanoma of skin: Secondary | ICD-10-CM | POA: Diagnosis not present

## 2019-10-12 DIAGNOSIS — Z791 Long term (current) use of non-steroidal anti-inflammatories (NSAID): Secondary | ICD-10-CM | POA: Diagnosis not present

## 2019-10-12 DIAGNOSIS — K640 First degree hemorrhoids: Secondary | ICD-10-CM | POA: Diagnosis not present

## 2019-10-12 DIAGNOSIS — Z86010 Personal history of colonic polyps: Secondary | ICD-10-CM | POA: Insufficient documentation

## 2019-10-12 DIAGNOSIS — Z885 Allergy status to narcotic agent status: Secondary | ICD-10-CM | POA: Diagnosis not present

## 2019-10-12 DIAGNOSIS — Z79899 Other long term (current) drug therapy: Secondary | ICD-10-CM | POA: Insufficient documentation

## 2019-10-12 DIAGNOSIS — I700 Atherosclerosis of aorta: Secondary | ICD-10-CM | POA: Diagnosis not present

## 2019-10-12 HISTORY — DX: Benign neoplasm of colon, unspecified: D12.6

## 2019-10-12 HISTORY — DX: Unspecified osteoarthritis, unspecified site: M19.90

## 2019-10-12 HISTORY — PX: COLONOSCOPY WITH PROPOFOL: SHX5780

## 2019-10-12 HISTORY — DX: Atherosclerosis of aorta: I70.0

## 2019-10-12 LAB — SARS CORONAVIRUS 2 (TAT 6-24 HRS): SARS Coronavirus 2: NEGATIVE

## 2019-10-12 SURGERY — COLONOSCOPY WITH PROPOFOL
Anesthesia: General

## 2019-10-12 MED ORDER — SODIUM CHLORIDE 0.9 % IV SOLN
INTRAVENOUS | Status: DC
  Administered 2019-10-12: 20 mL/h via INTRAVENOUS

## 2019-10-12 MED ORDER — PROPOFOL 500 MG/50ML IV EMUL
INTRAVENOUS | Status: DC | PRN
  Administered 2019-10-12: 145 ug/kg/min via INTRAVENOUS

## 2019-10-12 MED ORDER — PROPOFOL 500 MG/50ML IV EMUL
INTRAVENOUS | Status: AC
  Filled 2019-10-12: qty 50

## 2019-10-12 MED ORDER — PROPOFOL 10 MG/ML IV BOLUS
INTRAVENOUS | Status: DC | PRN
  Administered 2019-10-12: 70 mg via INTRAVENOUS

## 2019-10-12 NOTE — Interval H&P Note (Signed)
History and Physical Interval Note:  10/12/2019 9:50 AM  [name]  has presented today for surgery, with the diagnosis of P HX CP.  The various methods of treatment have been discussed with the patient and family. After consideration of risks, benefits and other options for treatment, the patient has consented to  Procedure(s): COLONOSCOPY WITH PROPOFOL (N/A) as a surgical intervention.  The patient's history has been reviewed, patient examined, no change in status, stable for surgery.  I have reviewed the patient's chart and labs.  Questions were answered to the patient's satisfaction.     [place], [place]

## 2019-10-12 NOTE — Anesthesia Postprocedure Evaluation (Signed)
Anesthesia Post Note  Patient: Kiara Becker  Procedure(s) Performed: COLONOSCOPY WITH PROPOFOL (N/A )  Patient location during evaluation: Endoscopy Anesthesia Type: General Level of consciousness: awake and alert Pain management: pain level controlled Vital Signs Assessment: post-procedure vital signs reviewed and stable Respiratory status: spontaneous breathing and respiratory function stable Cardiovascular status: stable Anesthetic complications: no   No complications documented.   Last Vitals:  Vitals:   10/12/19 1018 10/12/19 1028  BP: (!) 113/51 134/70  Pulse: 62 (!) 58  Resp: 18 19  Temp:    SpO2: 98% 99%    Last Pain:  Vitals:   10/12/19 1028  TempSrc:   PainSc: 0-No pain                 [name] K

## 2019-10-12 NOTE — Transfer of Care (Signed)
Immediate Anesthesia Transfer of Care Note  Patient: Kiara Becker  Procedure(s) Performed: COLONOSCOPY WITH PROPOFOL (N/A )  Patient Location: PACU  Anesthesia Type:General  Level of Consciousness: awake and alert   Airway & Oxygen Therapy: Patient Spontanous Breathing and Patient connected to nasal cannula oxygen  Post-op Assessment: Report given to RN and Post -op Vital signs reviewed and stable  Post vital signs: Reviewed and stable  Last Vitals:  Vitals Value Taken Time  BP 99/45 10/12/19 1008  Temp 36.4 C 10/12/19 1008  Pulse 60 10/12/19 1010  Resp 0 10/12/19 1010  SpO2 95 % 10/12/19 1010  Vitals shown include unvalidated device data.  Last Pain:  Vitals:   10/12/19 0923  TempSrc: Temporal  PainSc: 5          Complications: No complications documented.

## 2019-10-12 NOTE — Op Note (Signed)
[facility] Gastroenterology Patient Name: Kiara Becker Procedure Date: 10/12/2019 9:49 AM MRN: [id number] Account #: [id number] Date of Birth: 23-Oct-1945 Admit Type: Outpatient Age: 74 Room: [facility] ENDO ROOM 3 Gender: Female Note Status: Finalized Procedure:             Colonoscopy Indications:           Surveillance: Personal history of adenomatous polyps                         on last colonoscopy > 5 years ago Providers:             Lorie Apley K. Alice Reichert MD, MD Referring MD:          Juline Patch, MD (Referring MD) Medicines:             Propofol per Anesthesia Complications:         No immediate complications. Procedure:             Pre-Anesthesia Assessment:                        - The risks and benefits of the procedure and the                         sedation options and risks were discussed with the                         patient. All questions were answered and informed                         consent was obtained.                        - Patient identification and proposed procedure were                         verified prior to the procedure by the nurse. The                         procedure was verified in the procedure room.                        - ASA Grade Assessment: III - A patient with severe                         systemic disease.                        - After reviewing the risks and benefits, the patient                         was deemed in satisfactory condition to undergo the                         procedure.                        After obtaining informed consent, the colonoscope was                         passed under direct  vision. Throughout the procedure,                         the patient's blood pressure, pulse, and oxygen                         saturations were monitored continuously. The                         Colonoscope was introduced through the anus and                         advanced to the the ileocolonic  anastomosis. The                         colonoscopy was performed without difficulty. The                         patient tolerated the procedure well. The quality of                         the bowel preparation was adequate. Ileocolonic                         anastomosis were photographed. Findings:      The perianal and digital rectal examinations were normal. Pertinent       negatives include normal sphincter tone and no palpable rectal lesions.      Many small-mouthed diverticula were found in the left colon.      There was evidence of a prior end-to-side ileo-colonic anastomosis in       the transverse colon. This was patent and was characterized by healthy       appearing mucosa. The anastomosis was traversed. Estimated blood loss:       none.      Non-bleeding internal hemorrhoids were found during retroflexion. The       hemorrhoids were Grade I (internal hemorrhoids that do not prolapse).      The exam was otherwise without abnormality. Impression:            - Diverticulosis in the left colon.                        - Patent end-to-side ileo-colonic anastomosis,                         characterized by healthy appearing mucosa.                        - The entire examined colon is normal.                        - No specimens collected. Recommendation:        - Patient has a contact number available for                         emergencies. The signs and symptoms of potential                         delayed complications were discussed with the patient.  Return to normal activities tomorrow. Written                         discharge instructions were provided to the patient.                        - Resume previous diet.                        - Continue present medications.                        - No repeat colonoscopy due to current age (66 years                         or older) and the absence of colonic polyps.                        - You do NOT  require further colon cancer screening                         measures (Annual stool testing (i.e. hemoccult, FIT,                         cologuard), sigmoidoscopy, colonoscopy or CT                         colonography). You should share this recommendation                         with your Primary Care provider.                        - Return to GI office PRN.                        - The findings and recommendations were discussed with                         the patient. Procedure Code(s):     --- Professional ---                        N3976, Colorectal cancer screening; colonoscopy on                         individual at high risk Diagnosis Code(s):     --- Professional ---                        K57.30, Diverticulosis of large intestine without                         perforation or abscess without bleeding                        Z98.0, Intestinal bypass and anastomosis status                        Z86.010, Personal history of colonic polyps CPT copyright 2019 American Medical Association. All rights reserved. The codes documented in this report  are preliminary and upon coder review may  be revised to meet current compliance requirements. Efrain Sella MD, MD 10/12/2019 10:11:38 AM This report has been signed electronically. Number of Addenda: 0 Note Initiated On: 10/12/2019 9:49 AM Scope Withdrawal Time: 0 hours 2 minutes 31 seconds  Total Procedure Duration: 0 hours 9 minutes 7 seconds  Estimated Blood Loss:  Estimated blood loss: none.      [facility]

## 2019-10-12 NOTE — Anesthesia Preprocedure Evaluation (Signed)
Anesthesia Evaluation  Patient identified by MRN, date of birth, ID band Patient awake    Reviewed: Allergy & Precautions, NPO status , Patient's Chart, lab work & pertinent test results  History of Anesthesia Complications Negative for: history of anesthetic complications  Airway Mallampati: III       Dental   Pulmonary asthma , neg sleep apnea, neg COPD, Not current smoker,           Cardiovascular hypertension, Pt. on medications (-) Past MI and (-) CHF (-) dysrhythmias (-) Valvular Problems/Murmurs     Neuro/Psych neg Seizures    GI/Hepatic Neg liver ROS, neg GERD  ,  Endo/Other  neg diabetes  Renal/GU negative Renal ROS     Musculoskeletal   Abdominal   Peds  Hematology   Anesthesia Other Findings   Reproductive/Obstetrics                             Anesthesia Physical Anesthesia Plan  ASA: II  Anesthesia Plan: General   Post-op Pain Management:    Induction: Intravenous  PONV Risk Score and Plan: 3 and Propofol infusion, TIVA and Treatment may vary due to age or medical condition  Airway Management Planned: Nasal Cannula  Additional Equipment:   Intra-op Plan:   Post-operative Plan:   Informed Consent: I have reviewed the patients History and Physical, chart, labs and discussed the procedure including the risks, benefits and alternatives for the proposed anesthesia with the patient or authorized representative who has indicated his/her understanding and acceptance.       Plan Discussed with:   Anesthesia Plan Comments:         Anesthesia Quick Evaluation

## 2019-10-12 NOTE — H&P (Signed)
Outpatient short stay form Pre-procedure 10/12/2019 9:49 AM Maxximus Gotay K. Alice Reichert, M.D.  Primary Physician: Otilio Miu, M.D.  Reason for visit:  Remote history of adenomatous colon polyps (2009) s/p partial colectomy.  History of present illness:                            Patient presents for colonoscopy for a personal hx of colon polyps. The patient denies abdominal pain, abnormal weight loss or rectal bleeding.     Current Facility-Administered Medications:  .  0.9 %  sodium chloride infusion, , Intravenous, Continuous, Ulysses, Benay Pike, MD, Last Rate: 20 mL/hr at 10/12/19 0936, 20 mL/hr at 10/12/19 0936  Facility-Administered Medications Prior to Admission  Medication Dose Route Frequency Provider Last Rate Last Admin  . albuterol (PROVENTIL) (2.5 MG/3ML) 0.083% nebulizer solution 2.5 mg  2.5 mg Nebulization Once Juline Patch, MD       Medications Prior to Admission  Medication Sig Dispense Refill Last Dose  . albuterol (VENTOLIN HFA) 108 (90 Base) MCG/ACT inhaler Inhale 2 puffs into the lungs every 6 (six) hours as needed for wheezing or shortness of breath. 18 g 1 Past Week at Unknown time  . allopurinol (ZYLOPRIM) 100 MG tablet TAKE 1 TABLET(100 MG) BY MOUTH DAILY 90 tablet 1 Past Week at Unknown time  . ascorbic acid (VITAMIN C) 1000 MG tablet Take 1 tablet by mouth daily.   Past Week at Unknown time  . aspirin 81 MG EC tablet Take 1 tablet by mouth daily.   Past Week at Unknown time  . b complex vitamins capsule Take 1 capsule by mouth daily.   Past Week at Unknown time  . Calcium Carbonate-Vitamin D (CALTRATE 600+D PO) Take 1 tablet by mouth daily.   Past Week at Unknown time  . meclizine (ANTIVERT) 25 MG tablet Take 1 tablet (25 mg total) by mouth 3 (three) times daily as needed for dizziness. 30 tablet 5 Past Week at Unknown time  . meloxicam (MOBIC) 15 MG tablet TAKE 1 TABLET BY MOUTH DAILY 90 tablet 1 Past Week at Unknown time  . torsemide (DEMADEX) 10 MG tablet Take 10 mg  by mouth as needed (twice a week as needed for ankle swelling). Dr Nehemiah Massed   Past Week at Unknown time  . triamterene-hydrochlorothiazide (MAXZIDE-25) 37.5-25 MG tablet Take 0.5 tablets by mouth as directed. 3 times qweek- Dr Nehemiah Massed   10/12/2019 at 0630  . lovastatin (MEVACOR) 20 MG tablet Take by mouth.        Allergies  Allergen Reactions  . Codeine Sulfate Other (See Comments)  . Ezetimibe Other (See Comments)    Roaring in ears  . Fenofibrate Other (See Comments)    Roaring in ears  . Fenofibric Acid Other (See Comments)    Roaring in ears  . Gemfibrozil Other (See Comments)     Past Medical History:  Diagnosis Date  . Adenoma of colon   . Allergy   . Aortic atherosclerosis ([place])   . Arthritis   . Asthma   . Cancer ([place]) 2001   melanoma  . Colon cancer ([place]) 2009  . Hyperlipidemia   . Hypertension   . Ventral hernia   . Ventral hernia     Review of systems:  Otherwise negative.    Physical Exam  Gen: Alert, oriented. Appears stated age.  HEENT: [place]/AT. PERRLA. Lungs: CTA, no wheezes. CV: RR nl S1, S2. Abd: soft, benign, no masses. BS+  Ext: No edema. Pulses 2+    Planned procedures: Proceed with colonoscopy. The patient understands the nature of the planned procedure, indications, risks, alternatives and potential complications including but not limited to bleeding, infection, perforation, damage to internal organs and possible oversedation/side effects from anesthesia. The patient agrees and gives consent to proceed.  Please refer to procedure notes for findings, recommendations and patient disposition/instructions.     Kawanna Christley K. Alice Reichert, M.D. Gastroenterology 10/12/2019  9:49 AM

## 2019-10-13 ENCOUNTER — Encounter: Payer: Self-pay | Admitting: Internal Medicine

## 2019-10-17 ENCOUNTER — Other Ambulatory Visit: Payer: Self-pay

## 2019-10-17 ENCOUNTER — Ambulatory Visit (INDEPENDENT_AMBULATORY_CARE_PROVIDER_SITE_OTHER): Payer: Medicare Other | Admitting: Family Medicine

## 2019-10-17 ENCOUNTER — Encounter: Payer: Self-pay | Admitting: Family Medicine

## 2019-10-17 VITALS — Ht 64.0 in | Wt 178.0 lb

## 2019-10-17 DIAGNOSIS — R5383 Other fatigue: Secondary | ICD-10-CM | POA: Diagnosis not present

## 2019-10-17 DIAGNOSIS — E7889 Other lipoprotein metabolism disorders: Secondary | ICD-10-CM

## 2019-10-17 DIAGNOSIS — E8889 Other specified metabolic disorders: Secondary | ICD-10-CM

## 2019-10-17 NOTE — Patient Instructions (Signed)
Fatty Liver Disease  Fatty liver disease occurs when too much fat has built up in your liver cells. Fatty liver disease is also called hepatic steatosis or steatohepatitis. The liver removes harmful substances from your bloodstream and produces fluids that your body needs. It also helps your body use and store energy from the food you eat. In many cases, fatty liver disease does not cause symptoms or problems. It is often diagnosed when tests are being done for other reasons. However, over time, fatty liver can cause inflammation that may lead to more serious liver problems, such as scarring of the liver (cirrhosis) and liver failure. Fatty liver is associated with insulin resistance, increased body fat, high blood pressure (hypertension), and high cholesterol. These are features of metabolic syndrome and increase your risk for stroke, diabetes, and heart disease. What are the causes? This condition may be caused by:  Drinking too much alcohol.  Poor nutrition.  Obesity.  Cushing's syndrome.  Diabetes.  High cholesterol.  Certain drugs.  Poisons.  Some viral infections.  Pregnancy. What increases the risk? You are more likely to develop this condition if you:  Abuse alcohol.  Are overweight.  Have diabetes.  Have hepatitis.  Have a high triglyceride level.  Are pregnant. What are the signs or symptoms? Fatty liver disease often does not cause symptoms. If symptoms do develop, they can include:  Fatigue.  Weakness.  Weight loss.  Confusion.  Abdominal pain.  Nausea and vomiting.  Yellowing of your skin and the white parts of your eyes (jaundice).  Itchy skin. How is this diagnosed? This condition may be diagnosed by:  A physical exam and medical history.  Blood tests.  Imaging tests, such as an ultrasound, CT scan, or MRI.  A liver biopsy. A small sample of liver tissue is removed using a needle. The sample is then looked at under a microscope. How  is this treated? Fatty liver disease is often caused by other health conditions. Treatment for fatty liver may involve medicines and lifestyle changes to manage conditions such as:  Alcoholism.  High cholesterol.  Diabetes.  Being overweight or obese. Follow these instructions at home:   Do not drink alcohol. If you have trouble quitting, ask your health care provider how to safely quit with the help of medicine or a supervised program. This is important to keep your condition from getting worse.  Eat a healthy diet as told by your health care provider. Ask your health care provider about working with a diet and nutrition specialist (dietitian) to develop an eating plan.  Exercise regularly. This can help you lose weight and control your cholesterol and diabetes. Talk to your health care provider about an exercise plan and which activities are best for you.  Take over-the-counter and prescription medicines only as told by your health care provider.  Keep all follow-up visits as told by your health care provider. This is important. Contact a health care provider if: You have trouble controlling your:  Blood sugar. This is especially important if you have diabetes.  Cholesterol.  Drinking of alcohol. Get help right away if:  You have abdominal pain.  You have jaundice.  You have nausea and vomiting.  You vomit blood or material that looks like coffee grounds.  You have stools that are black, tar-like, or bloody. Summary  Fatty liver disease develops when too much fat builds up in the cells of your liver.  Fatty liver disease often causes no symptoms or problems. However, over   time, fatty liver can cause inflammation that may lead to more serious liver problems, such as scarring of the liver (cirrhosis).  You are more likely to develop this condition if you abuse alcohol, are pregnant, are overweight, have diabetes, have hepatitis, or have high triglyceride  levels.  Contact your health care provider if you have trouble controlling your weight, blood sugar, cholesterol, or drinking of alcohol. This information is not intended to replace advice given to you by your health care provider. Make sure you discuss any questions you have with your health care provider. Document Revised: 01/02/2017 Document Reviewed: 10/29/2016 Elsevier Patient Education  2020 [place]. Nonalcoholic Fatty Liver Disease Diet, Adult Nonalcoholic fatty liver disease is a condition that causes fat to build up in and around the liver. The disease makes it harder for the liver to work the way that it should. Following a healthy diet can help to keep nonalcoholic fatty liver disease under control. It can also help to prevent or improve conditions that are associated with the disease, such as heart disease, diabetes, high blood pressure, and abnormal cholesterol levels. Along with regular exercise, this diet:  Promotes weight loss.  Helps to control blood sugar levels.  Helps to improve the way that the body uses insulin. What are tips for following this plan? Reading food labels Always check food labels for:  The amount of saturated fat in a food. You should limit your intake of saturated fat. Saturated fat is found in foods that come from animals, including meat and dairy products such as butter, cheese, and whole milk.  The amount of fiber in a food. You should choose high-fiber foods such as fruits, vegetables, and whole grains. Try to get 25-30 grams (g) of fiber a day.  Cooking  When cooking, use heart-healthy oils that are high in monounsaturated fats. These include olive oil, canola oil, and avocado oil.  Limit frying or deep-frying foods. Cook foods using healthy methods such as baking, boiling, steaming, and grilling instead. Meal planning  You may want to keep track of how many calories you take in. Eating the right amount of calories will help you achieve a  healthy weight. Meeting with a registered dietitian can help you get started.  Limit how often you eat takeout and fast food. These foods are usually very high in fat, salt, and sugar.  Use the glycemic index (GI) to plan your meals. The index tells you how quickly a food will raise your blood sugar. Choose low-GI foods (GI less than 55). These foods take a longer time to raise blood sugar. A registered dietitian can help you identify foods lower on the GI scale. Lifestyle  You may want to follow a Mediterranean diet. This diet includes a lot of vegetables, lean meats or fish, whole grains, fruits, and healthy oils and fats. What foods can I eat?  Fruits Bananas. Apples. Oranges. Grapes. Papaya. Mango. Pomegranate. Kiwi. Grapefruit. Cherries. Vegetables Lettuce. Spinach. Peas. Beets. Cauliflower. Cabbage. Broccoli. Carrots. Tomatoes. Squash. Eggplant. Herbs. Peppers. Onions. Cucumbers. Brussels sprouts. Yams and sweet potatoes. Beans. Lentils. Grains Whole wheat or whole-grain foods, including breads, crackers, cereals, and pasta. Stone-ground whole wheat. Unsweetened oatmeal. Bulgur. Barley. Quinoa. Brown or wild rice. Corn or whole wheat flour tortillas. Meats and other proteins Lean meats. Poultry. Tofu. Seafood and shellfish. Dairy Low-fat or fat-free dairy products, such as yogurt, cottage cheese, or cheese. Beverages Water. Sugar-free drinks. Tea. Coffee. Low-fat or skim milk. Milk alternatives, such as soy or almond milk.  Real fruit juice. Fats and oils Avocado. Canola or olive oil. Nuts and nut butters. Seeds. Seasonings and condiments Mustard. Relish. Low-fat, low-sugar ketchup and barbecue sauce. Low-fat or fat-free mayonnaise. Sweets and desserts Sugar-free sweets. The items listed above may not be a complete list of foods and beverages you can eat. Contact a dietitian for more information. What foods should I limit or avoid? Meats and other proteins Limit red meat to 1-2  times a week. Dairy [employer]. Fats and oils Palm oil and coconut oil. Fried foods. Other foods Processed foods. Foods that contain a lot of salt or sodium. Sweets and desserts Sweets that contain sugar. Beverages Sweetened drinks, such as sweet tea, milkshakes, iced sweet drinks, and sodas. Alcohol. The items listed above may not be a complete list of foods and beverages you should avoid. Contact a dietitian for more information. Where to find more information The [employer] of Diabetes and Digestive and Kidney Diseases: [email or web address] Summary  Nonalcoholic fatty liver disease is a condition that causes fat to build up in and around the liver.  Following a healthy diet can help to keep nonalcoholic fatty liver disease under control. Your diet should be rich in fruits, vegetables, whole grains, and lean proteins.  Limit your intake of saturated fat. Saturated fat is found in foods that come from animals, including meat and dairy products such as butter, cheese, and whole milk.  This diet promotes weight loss, helps to control blood sugar levels, and helps to improve the way that the body uses insulin. This information is not intended to replace advice given to you by your health care provider. Make sure you discuss any questions you have with your health care provider. Document Revised: 05/14/2018 Document Reviewed: 02/11/2018 Elsevier Patient Education  [place].

## 2019-10-17 NOTE — Progress Notes (Signed)
Date:  10/17/2019   Name:  Kiara Becker   DOB:  April 22, 1945   MRN:  [id number]   Chief Complaint: cardiologist questions (fatty liver?? last TSH test?? fasting glucose???? )  Patient is a 74 year old female who presents for a consultation exam. The patient reports the following problems: steatosis/ thyroid concern. Health maintenance has been reviewed mammogram.  Thyroid Problem Presents for initial visit. Symptoms include dry skin and fatigue. Patient reports no anxiety, cold intolerance, constipation, depressed mood, diaphoresis, diarrhea, hair loss, heat intolerance, hoarse voice, leg swelling, menstrual problem, nail problem, palpitations, visual change, weight gain or weight loss.    Lab Results  Component Value Date   CREATININE 1.03 (H) 08/17/2017   BUN 26 08/17/2017   NA 141 08/17/2017   K 3.6 08/17/2017   CL 97 08/17/2017   CO2 24 08/17/2017   Lab Results  Component Value Date   CHOL 175 08/17/2017   HDL 35 (L) 08/17/2017   LDLCALC 79 08/17/2017   TRIG 306 (H) 08/17/2017   CHOLHDL 5.0 (H) 08/17/2017   Lab Results  Component Value Date   TSH 3.500 05/01/2015   Lab Results  Component Value Date   HGBA1C 5.7 (H) 12/28/2017   Lab Results  Component Value Date   WBC 10.8 05/01/2015   HGB 13.7 05/01/2015   HCT 41.2 05/01/2015   MCV 86 05/01/2015   PLT 305 05/01/2015   No results found for: ALT, AST, GGT, ALKPHOS, BILITOT   Review of Systems  Constitutional: Positive for fatigue. Negative for chills, diaphoresis, fever, unexpected weight change, weight gain and weight loss.  HENT: Negative for congestion, ear discharge, ear pain, hoarse voice, rhinorrhea, sinus pressure, sneezing and sore throat.   Eyes: Negative for photophobia, pain, discharge, redness and itching.  Respiratory: Negative for cough, shortness of breath, wheezing and stridor.   Cardiovascular: Negative for palpitations.  Gastrointestinal: Negative for abdominal pain, blood in stool,  constipation, diarrhea, nausea and vomiting.  Endocrine: Negative for cold intolerance, heat intolerance, polydipsia, polyphagia and polyuria.  Genitourinary: Negative for dysuria, flank pain, frequency, hematuria, menstrual problem, pelvic pain, urgency, vaginal bleeding and vaginal discharge.  Musculoskeletal: Negative for arthralgias, back pain and myalgias.  Skin: Negative for rash.  Allergic/Immunologic: Negative for environmental allergies and food allergies.  Neurological: Negative for dizziness, weakness, light-headedness, numbness and headaches.  Hematological: Negative for adenopathy. Does not bruise/bleed easily.  Psychiatric/Behavioral: Negative for dysphoric mood. The patient is not nervous/anxious.     Patient Active Problem List   Diagnosis Date Noted  . Varicose veins of bilateral lower extremities with other complications 54/27/0623  . Aortic atherosclerosis ([place]) 09/26/2016  . Bladder neoplasm of uncertain malignant potential 08/22/2016  . Reactive airway disease 08/11/2016  . Benign paroxysmal positional vertigo 08/11/2016  . Moderate asthma with acute exacerbation 11/05/2015  . Bronchiolitis 08/14/2014  . Medicare annual wellness visit, subsequent 07/24/2014  . Adiposity 07/24/2014  . Awareness of heartbeats 07/14/2014  . Beat, premature ventricular 07/14/2014  . Essential (primary) hypertension 06/16/2014  . Mixed hyperlipidemia 06/16/2014  . Midline cystocele 09/21/2013  . Frank hematuria 09/06/2013  . Renal mass, right 09/06/2013  . Urge incontinence 09/06/2013    Allergies  Allergen Reactions  . Codeine Sulfate Other (See Comments)  . Ezetimibe Other (See Comments)    Roaring in ears  . Fenofibrate Other (See Comments)    Roaring in ears  . Fenofibric Acid Other (See Comments)    Roaring in ears  . Gemfibrozil Other (  See Comments)    Past Surgical History:  Procedure Laterality Date  . CARPAL TUNNEL RELEASE Bilateral   . CHOLECYSTECTOMY   11/05/2010   Dr. Pat Patrick  . COLECTOMY  10/13/2007   Laparoscopic assisting ascending colectomy  . COLON SURGERY  2009   remove a mass  . COLONOSCOPY  03/2014   normal- cleared for 5 years- Dr Vira Agar  . COLONOSCOPY WITH PROPOFOL N/A 10/12/2019   Procedure: COLONOSCOPY WITH PROPOFOL;  Surgeon: Toledo, Benay Pike, MD;  Location: [facility] ENDOSCOPY;  Service: Gastroenterology;  Laterality: N/A;  . GALLBLADDER SURGERY    . HERNIA REPAIR    . JOINT REPLACEMENT    . KNEE SURGERY Bilateral    menisacal repair and then replacement  . LAPAROSCOPIC PARTIAL COLECTOMY    . SKIN SURGERY     removal melanoma on arm and eye  . TRIGGER FINGER RELEASE Left   . VAGINAL HYSTERECTOMY  1984    Social History   Tobacco Use  . Smoking status: Never Smoker  . Smokeless tobacco: Never Used  . Tobacco comment: smoking cessation materials not required  Vaping Use  . Vaping Use: Never used  Substance Use Topics  . Alcohol use: No    Alcohol/week: 0.0 standard drinks  . Drug use: No     Medication list has been reviewed and updated.  Current Meds  Medication Sig  . albuterol (VENTOLIN HFA) 108 (90 Base) MCG/ACT inhaler Inhale 2 puffs into the lungs every 6 (six) hours as needed for wheezing or shortness of breath.  . allopurinol (ZYLOPRIM) 100 MG tablet TAKE 1 TABLET(100 MG) BY MOUTH DAILY  . ascorbic acid (VITAMIN C) 1000 MG tablet Take 1 tablet by mouth daily.  Marland Kitchen aspirin 81 MG EC tablet Take 1 tablet by mouth daily.  Marland Kitchen b complex vitamins capsule Take 1 capsule by mouth daily.  . Calcium Carbonate-Vitamin D (CALTRATE 600+D PO) Take 1 tablet by mouth daily.  Marland Kitchen HYDROcodone-acetaminophen (NORCO) 10-325 MG tablet Take 1 tablet by mouth every 6 (six) hours as needed.  . lovastatin (MEVACOR) 20 MG tablet Take by mouth.  . meclizine (ANTIVERT) 25 MG tablet Take 1 tablet (25 mg total) by mouth 3 (three) times daily as needed for dizziness.  . meloxicam (MOBIC) 15 MG tablet TAKE 1 TABLET BY MOUTH DAILY  . mupirocin  ointment (BACTROBAN) 2 % 1 application 2 (two) times daily.  Marland Kitchen nystatin cream (MYCOSTATIN) Apply 1 application topically 2 (two) times daily.  Marland Kitchen torsemide (DEMADEX) 10 MG tablet Take 10 mg by mouth as needed (twice a week as needed for ankle swelling). Dr Nehemiah Massed  . triamterene-hydrochlorothiazide (MAXZIDE-25) 37.5-25 MG tablet Take 0.5 tablets by mouth as directed. 3 times qweek- Dr Nehemiah Massed   Current Facility-Administered Medications for the 10/17/19 encounter (Office Visit) with Juline Patch, MD  Medication  . albuterol (PROVENTIL) (2.5 MG/3ML) 0.083% nebulizer solution 2.5 mg    PHQ 2/9 Scores 10/17/2019 09/05/2019 05/17/2019 11/15/2018  PHQ - 2 Score 0 0 0 0  PHQ- 9 Score 0 - 0 0    GAD 7 : Generalized Anxiety Score 10/17/2019 05/17/2019  Nervous, Anxious, on Edge 0 0  Control/stop worrying 0 0  Worry too much - different things 0 0  Trouble relaxing 0 0  Restless 0 0  Easily annoyed or irritable 0 0  Afraid - awful might happen 0 0  Total GAD 7 Score 0 0  Anxiety Difficulty Not difficult at all -    BP Readings from Last  3 Encounters:  10/12/19 134/70  09/05/19 114/70  05/17/19 128/62    Physical Exam Vitals and nursing note reviewed.  Constitutional:      Appearance: She is well-developed.  HENT:     Head: Normocephalic.     Right Ear: External ear normal.     Left Ear: External ear normal.  Eyes:     General: Lids are everted, no foreign bodies appreciated. No scleral icterus.       Left eye: No foreign body or hordeolum.     Conjunctiva/sclera: Conjunctivae normal.     Right eye: Right conjunctiva is not injected.     Left eye: Left conjunctiva is not injected.     Pupils: Pupils are equal, round, and reactive to light.  Neck:     Thyroid: No thyromegaly.     Vascular: No JVD.     Trachea: No tracheal deviation.  Cardiovascular:     Rate and Rhythm: Normal rate and regular rhythm.     Heart sounds: Normal heart sounds. No murmur heard.  No friction rub. No  gallop.   Pulmonary:     Effort: Pulmonary effort is normal. No respiratory distress.     Breath sounds: Normal breath sounds. No wheezing or rales.  Abdominal:     General: Bowel sounds are normal.     Palpations: Abdomen is soft. There is no hepatomegaly, splenomegaly or mass.     Tenderness: There is no abdominal tenderness. There is no right CVA tenderness, left CVA tenderness, guarding or rebound.  Musculoskeletal:        General: No tenderness. Normal range of motion.     Cervical back: Normal range of motion and neck supple.  Lymphadenopathy:     Cervical: No cervical adenopathy.  Skin:    General: Skin is warm.     Findings: No rash.  Neurological:     Mental Status: She is alert and oriented to person, place, and time.     Cranial Nerves: No cranial nerve deficit.     Deep Tendon Reflexes: Reflexes normal.  Psychiatric:        Mood and Affect: Mood is not anxious or depressed.     Wt Readings from Last 3 Encounters:  10/17/19 178 lb (80.7 kg)  10/12/19 174 lb (78.9 kg)  09/05/19 178 lb 12.8 oz (81.1 kg)    Ht 5\' 4"  (1.626 m)   Wt 178 lb (80.7 kg)   BMI 30.55 kg/m   Assessment and Plan: 1. Steatosis Probably chronic.  Stable.  Relatively controlled except it was noted that triglycerides have been persistently elevated.  Patient wanted explanations as to what a "fatty liver "exactly is.  We discussed steatosis and how it is generally picked up by chance on an CT or ultrasound.  In this case it was an ultrasound done for follow-up of her kidney concern by [facility].  It was noted that she had some suggestion of a fatty liver.  For this reason we will aggressively go after and Dr. Nehemiah Massed is anticipating starting Vascepa twice a day for triglycerides concerns.  In the meantime patient has been given a low-fat diet and encouraged to lose weight.  2. Fatigue, unspecified type Patient is also been having some fatigue and is been question whether or not she may have a  thyroid issue.  Prior to return for recheck we will do a thyroid panel with TSH and a CBC for evaluation of thyroid or anemia. - Thyroid Panel With TSH -  CBC with Differential/Platelet

## 2019-10-18 LAB — CBC WITH DIFFERENTIAL/PLATELET
Basophils Absolute: 0.1 10*3/uL (ref 0.0–0.2)
Basos: 1 %
EOS (ABSOLUTE): 0.5 10*3/uL — ABNORMAL HIGH (ref 0.0–0.4)
Eos: 4 %
Hematocrit: 41.3 % (ref 34.0–46.6)
Hemoglobin: 14 g/dL (ref 11.1–15.9)
Immature Grans (Abs): 0 10*3/uL (ref 0.0–0.1)
Immature Granulocytes: 0 %
Lymphocytes Absolute: 2.7 10*3/uL (ref 0.7–3.1)
Lymphs: 23 %
MCH: 28.7 pg (ref 26.6–33.0)
MCHC: 33.9 g/dL (ref 31.5–35.7)
MCV: 85 fL (ref 79–97)
Monocytes Absolute: 1.1 10*3/uL — ABNORMAL HIGH (ref 0.1–0.9)
Monocytes: 10 %
Neutrophils Absolute: 7.1 10*3/uL — ABNORMAL HIGH (ref 1.4–7.0)
Neutrophils: 62 %
Platelets: 300 10*3/uL (ref 150–450)
RBC: 4.87 x10E6/uL (ref 3.77–5.28)
RDW: 13.5 % (ref 11.7–15.4)
WBC: 11.4 10*3/uL — ABNORMAL HIGH (ref 3.4–10.8)

## 2019-10-18 LAB — THYROID PANEL WITH TSH
Free Thyroxine Index: 1.4 (ref 1.2–4.9)
T3 Uptake Ratio: 21 % — ABNORMAL LOW (ref 24–39)
T4, Total: 6.6 ug/dL (ref 4.5–12.0)
TSH: 1.82 u[IU]/mL (ref 0.450–4.500)

## 2019-11-17 ENCOUNTER — Other Ambulatory Visit: Payer: Self-pay

## 2019-11-17 ENCOUNTER — Encounter: Payer: Self-pay | Admitting: Family Medicine

## 2019-11-17 ENCOUNTER — Ambulatory Visit (INDEPENDENT_AMBULATORY_CARE_PROVIDER_SITE_OTHER): Payer: Medicare Other | Admitting: Family Medicine

## 2019-11-17 VITALS — BP 120/80 | HR 72 | Ht 64.0 in | Wt 175.0 lb

## 2019-11-17 DIAGNOSIS — Z8719 Personal history of other diseases of the digestive system: Secondary | ICD-10-CM | POA: Diagnosis not present

## 2019-11-17 DIAGNOSIS — Z23 Encounter for immunization: Secondary | ICD-10-CM

## 2019-11-17 DIAGNOSIS — R7301 Impaired fasting glucose: Secondary | ICD-10-CM

## 2019-11-17 DIAGNOSIS — E790 Hyperuricemia without signs of inflammatory arthritis and tophaceous disease: Secondary | ICD-10-CM

## 2019-11-17 DIAGNOSIS — D72829 Elevated white blood cell count, unspecified: Secondary | ICD-10-CM | POA: Diagnosis not present

## 2019-11-17 DIAGNOSIS — J4520 Mild intermittent asthma, uncomplicated: Secondary | ICD-10-CM | POA: Diagnosis not present

## 2019-11-17 DIAGNOSIS — M519 Unspecified thoracic, thoracolumbar and lumbosacral intervertebral disc disorder: Secondary | ICD-10-CM | POA: Diagnosis not present

## 2019-11-17 MED ORDER — MELOXICAM 15 MG PO TABS: ORAL_TABLET | ORAL | 1 refills | Status: DC

## 2019-11-17 MED ORDER — ALBUTEROL SULFATE HFA 108 (90 BASE) MCG/ACT IN AERS: 2.0000 | INHALATION_SPRAY | Freq: Four times a day (QID) | RESPIRATORY_TRACT | 1 refills | Status: DC | PRN

## 2019-11-17 MED ORDER — ALLOPURINOL 100 MG PO TABS: ORAL_TABLET | ORAL | 1 refills | Status: DC

## 2019-11-17 NOTE — Progress Notes (Signed)
Date:  11/17/2019   Name:  Kiara Becker   DOB:  [birth year]   MRN:  [id number]   Chief Complaint: Asthma, Gout, Arthritis, and Flu Vaccine  Asthma There is no chest tightness, cough, difficulty breathing, frequent throat clearing, hemoptysis, hoarse voice, shortness of breath, sputum production or wheezing. This is a chronic problem. The current episode started more than 1 year ago. The problem occurs daily. The problem has been gradually improving. Pertinent negatives include no appetite change, chest pain, dyspnea on exertion, ear congestion, ear pain, fever, headaches, heartburn, malaise/fatigue, myalgias, nasal congestion, orthopnea, PND, postnasal drip, rhinorrhea, sneezing, sore throat, sweats, trouble swallowing or weight loss. Her symptoms are aggravated by nothing. Her symptoms are alleviated by nothing. She reports moderate improvement on treatment. Her symptoms are not alleviated by beta-agonist. There are no known risk factors for lung disease. Her past medical history is significant for asthma. There is no history of bronchiectasis, bronchitis, COPD, emphysema or pneumonia.  Arthritis Presents for follow-up visit. She reports no pain, stiffness, joint swelling or joint warmth. The symptoms have been stable. Pertinent negatives include no diarrhea, dysuria, fatigue, fever, rash or weight loss.    Lab Results  Component Value Date   CREATININE 1.03 (H) 08/17/2017   BUN 26 08/17/2017   NA 141 08/17/2017   K 3.6 08/17/2017   CL 97 08/17/2017   CO2 24 08/17/2017   Lab Results  Component Value Date   CHOL 175 08/17/2017   HDL 35 (L) 08/17/2017   LDLCALC 79 08/17/2017   TRIG 306 (H) 08/17/2017   CHOLHDL 5.0 (H) 08/17/2017   Lab Results  Component Value Date   TSH 1.820 10/17/2019   Lab Results  Component Value Date   HGBA1C 5.7 (H) 12/28/2017   Lab Results  Component Value Date   WBC 11.4 (H) 10/17/2019   HGB 14.0 10/17/2019   HCT 41.3 10/17/2019   MCV 85  10/17/2019   PLT 300 10/17/2019   No results found for: ALT, AST, GGT, ALKPHOS, BILITOT   Review of Systems  Constitutional: Negative.  Negative for appetite change, chills, fatigue, fever, malaise/fatigue, unexpected weight change and weight loss.  HENT: Negative for congestion, ear discharge, ear pain, hoarse voice, postnasal drip, rhinorrhea, sinus pressure, sneezing, sore throat and trouble swallowing.   Eyes: Negative for photophobia, pain, discharge, redness and itching.  Respiratory: Negative for cough, hemoptysis, sputum production, shortness of breath, wheezing and stridor.   Cardiovascular: Negative for chest pain, dyspnea on exertion and PND.  Gastrointestinal: Negative for abdominal pain, blood in stool, constipation, diarrhea, heartburn, nausea and vomiting.  Endocrine: Negative for cold intolerance, heat intolerance, polydipsia, polyphagia and polyuria.  Genitourinary: Negative for dysuria, flank pain, frequency, hematuria, menstrual problem, pelvic pain, urgency, vaginal bleeding and vaginal discharge.  Musculoskeletal: Positive for arthritis. Negative for arthralgias, back pain, joint swelling, myalgias and stiffness.  Skin: Negative for rash.  Allergic/Immunologic: Negative for environmental allergies and food allergies.  Neurological: Negative for dizziness, weakness, light-headedness, numbness and headaches.  Hematological: Negative for adenopathy. Does not bruise/bleed easily.  Psychiatric/Behavioral: Negative for dysphoric mood. The patient is not nervous/anxious.     Patient Active Problem List   Diagnosis Date Noted  . Varicose veins of bilateral lower extremities with other complications 73/22/0254  . Aortic atherosclerosis ([place]) 09/26/2016  . Bladder neoplasm of uncertain malignant potential 08/22/2016  . Reactive airway disease 08/11/2016  . Benign paroxysmal positional vertigo 08/11/2016  . Moderate asthma with acute exacerbation 11/05/2015  .  Bronchiolitis  08/14/2014  . Medicare annual wellness visit, subsequent 07/24/2014  . Adiposity 07/24/2014  . Awareness of heartbeats 07/14/2014  . Beat, premature ventricular 07/14/2014  . Essential (primary) hypertension 06/16/2014  . Mixed hyperlipidemia 06/16/2014  . Midline cystocele 09/21/2013  . Frank hematuria 09/06/2013  . Renal mass, right 09/06/2013  . Urge incontinence 09/06/2013    Allergies  Allergen Reactions  . Codeine Sulfate Other (See Comments)  . Ezetimibe Other (See Comments)    Roaring in ears  . Fenofibrate Other (See Comments)    Roaring in ears  . Fenofibric Acid Other (See Comments)    Roaring in ears  . Gemfibrozil Other (See Comments)    Past Surgical History:  Procedure Laterality Date  . CARPAL TUNNEL RELEASE Bilateral   . CHOLECYSTECTOMY  11/05/2010   Dr. Pat Patrick  . COLECTOMY  10/13/2007   Laparoscopic assisting ascending colectomy  . COLON SURGERY  2009   remove a mass  . COLONOSCOPY  03/2014   normal- cleared for 5 years- Dr Vira Agar  . COLONOSCOPY WITH PROPOFOL N/A 10/12/2019   Procedure: COLONOSCOPY WITH PROPOFOL;  Surgeon: Toledo, Benay Pike, MD;  Location: [facility] ENDOSCOPY;  Service: Gastroenterology;  Laterality: N/A;  . GALLBLADDER SURGERY    . HERNIA REPAIR    . JOINT REPLACEMENT    . KNEE SURGERY Bilateral    menisacal repair and then replacement  . LAPAROSCOPIC PARTIAL COLECTOMY    . SKIN SURGERY     removal melanoma on arm and eye  . TRIGGER FINGER RELEASE Left   . VAGINAL HYSTERECTOMY  1984    Social History   Tobacco Use  . Smoking status: Never Smoker  . Smokeless tobacco: Never Used  . Tobacco comment: smoking cessation materials not required  Vaping Use  . Vaping Use: Never used  Substance Use Topics  . Alcohol use: No    Alcohol/week: 0.0 standard drinks  . Drug use: No     Medication list has been reviewed and updated.  Current Meds  Medication Sig  . albuterol (VENTOLIN HFA) 108 (90 Base) MCG/ACT inhaler Inhale 2 puffs into  the lungs every 6 (six) hours as needed for wheezing or shortness of breath.  . allopurinol (ZYLOPRIM) 100 MG tablet TAKE 1 TABLET(100 MG) BY MOUTH DAILY  . ascorbic acid (VITAMIN C) 1000 MG tablet Take 1 tablet by mouth daily.  Marland Kitchen aspirin 81 MG EC tablet Take 1 tablet by mouth daily.  Marland Kitchen b complex vitamins capsule Take 1 capsule by mouth daily.  . Calcium Carbonate-Vitamin D (CALTRATE 600+D PO) Take 1 tablet by mouth daily.  Marland Kitchen lovastatin (MEVACOR) 20 MG tablet Take by mouth.  . meclizine (ANTIVERT) 25 MG tablet Take 1 tablet (25 mg total) by mouth 3 (three) times daily as needed for dizziness.  . meloxicam (MOBIC) 15 MG tablet TAKE 1 TABLET BY MOUTH DAILY  . mupirocin ointment (BACTROBAN) 2 % 1 application 2 (two) times daily.  Marland Kitchen nystatin cream (MYCOSTATIN) Apply 1 application topically 2 (two) times daily.  Marland Kitchen torsemide (DEMADEX) 10 MG tablet Take 10 mg by mouth as needed (twice a week as needed for ankle swelling). Dr Nehemiah Massed  . triamterene-hydrochlorothiazide (MAXZIDE-25) 37.5-25 MG tablet Take 0.5 tablets by mouth as directed. 3 times qweek- Dr Nehemiah Massed  . Zinc 50 MG TABS Take 1 tablet by mouth daily.   Current Facility-Administered Medications for the 11/17/19 encounter (Office Visit) with Juline Patch, MD  Medication  . albuterol (PROVENTIL) (2.5 MG/3ML) 0.083% nebulizer solution  2.5 mg    PHQ 2/9 Scores 11/17/2019 10/17/2019 09/05/2019 05/17/2019  PHQ - 2 Score 0 0 0 0  PHQ- 9 Score 0 0 - 0    GAD 7 : Generalized Anxiety Score 11/17/2019 10/17/2019 05/17/2019  Nervous, Anxious, on Edge 0 0 0  Control/stop worrying 0 0 0  Worry too much - different things 0 0 0  Trouble relaxing 0 0 0  Restless 0 0 0  Easily annoyed or irritable 0 0 0  Afraid - awful might happen 0 0 0  Total GAD 7 Score 0 0 0  Anxiety Difficulty - Not difficult at all -    BP Readings from Last 3 Encounters:  11/17/19 120/80  10/12/19 134/70  09/05/19 114/70    Physical Exam Vitals and nursing note  reviewed.  Constitutional:      General: She is not in acute distress.    Appearance: She is not diaphoretic.  HENT:     Head: Normocephalic and atraumatic.     Right Ear: Tympanic membrane, ear canal and external ear normal. There is no impacted cerumen.     Left Ear: Tympanic membrane, ear canal and external ear normal. There is no impacted cerumen.     Nose: Nose normal. No congestion or rhinorrhea.  Eyes:     General:        Right eye: No discharge.        Left eye: No discharge.     Conjunctiva/sclera: Conjunctivae normal.     Pupils: Pupils are equal, round, and reactive to light.  Neck:     Thyroid: No thyromegaly.     Vascular: No JVD.  Cardiovascular:     Rate and Rhythm: Normal rate and regular rhythm.     Heart sounds: Normal heart sounds. No murmur heard.  No friction rub. No gallop.   Pulmonary:     Effort: Pulmonary effort is normal.     Breath sounds: Normal breath sounds. No wheezing or rhonchi.  Abdominal:     General: Bowel sounds are normal.     Palpations: Abdomen is soft. There is no mass.     Tenderness: There is no abdominal tenderness. There is no guarding.  Musculoskeletal:        General: Normal range of motion.     Cervical back: Normal range of motion and neck supple.  Lymphadenopathy:     Cervical: No cervical adenopathy.  Skin:    General: Skin is warm and dry.     Capillary Refill: Capillary refill takes less than 2 seconds.     Coloration: Skin is not jaundiced or pale.     Findings: No bruising or erythema.  Neurological:     General: No focal deficit present.     Mental Status: She is alert.     Deep Tendon Reflexes: Reflexes are normal and symmetric.     Wt Readings from Last 3 Encounters:  11/17/19 175 lb (79.4 kg)  10/17/19 178 lb (80.7 kg)  10/12/19 174 lb (78.9 kg)    BP 120/80   Pulse 72   Ht 5\' 4"  (1.626 m)   Wt 175 lb (79.4 kg)   BMI 30.04 kg/m   Assessment and Plan: 1. Reactive airway disease, mild intermittent,  uncomplicated Chronic.  Controlled.  Stable.  Continue albuterol 2 puffs every 6 hours as needed for wheezing and shortness of breath. - albuterol (VENTOLIN HFA) 108 (90 Base) MCG/ACT inhaler; Inhale 2 puffs into the lungs every 6 (six)  hours as needed for wheezing or shortness of breath.  Dispense: 18 g; Refill: 1  2. Hyperuricemia Chronic.  Controlled.  Stable.  Currently this is controlled with allopurinol 100 mg one a day. - allopurinol (ZYLOPRIM) 100 MG tablet; TAKE 1 TABLET(100 MG) BY MOUTH DAILY  Dispense: 90 tablet; Refill: 1  3. Thoracic disc disease Chronic.  Controlled.  Stable.  Continue meloxicam 15 mg once a day as needed for discomfort. - meloxicam (MOBIC) 15 MG tablet; TAKE 1 TABLET BY MOUTH DAILY  Dispense: 90 tablet; Refill: 1  4. Need for immunization against influenza Discussed and administered. - Flu Vaccine QUAD High Dose(Fluad)  5. Leukocytosis, unspecified type Review of previous CBC notes a mild elevation of white blood cells and minimal elevation of absolute neutrophils.  Patient's been having some mild discomfort in the left lower quadrant of the abdomen which is consistent with her history of diverticulosis.  Rather than treating with antibiotic at this time we will recheck leukocytosis concern to see if it is return to normal range. - CBC with Differential/Platelet  6. Elevated fasting glucose Patient was noted to have an elevated glucose and we will repeat fasting glucose today to see if this has alleviated. - Renal Function Panel  7. History of diverticulosis As noted above patient history of of diverticulosis likely secondary to colonoscopy surveillance.  We will check a CBC to see if there is any issues concerning with the discomfort as noted above.  Patient has had surgical correction of abdominal hernias previous by Dr. Pat Patrick by laparoscopic concerns however at this time there is no significant the distortion of the abdominal contour no significant palpable  tenderness.  We also discussed the likelihood that they would use mesh in these procedures and she is absolutely against having any mesh inserted. - CBC with Differential/Platelet

## 2019-11-18 ENCOUNTER — Other Ambulatory Visit: Payer: Self-pay

## 2019-11-18 DIAGNOSIS — K5792 Diverticulitis of intestine, part unspecified, without perforation or abscess without bleeding: Secondary | ICD-10-CM

## 2019-11-18 LAB — RENAL FUNCTION PANEL
Albumin: 4.9 g/dL — ABNORMAL HIGH (ref 3.7–4.7)
BUN/Creatinine Ratio: 27 (ref 12–28)
BUN: 24 mg/dL (ref 8–27)
CO2: 25 mmol/L (ref 20–29)
Calcium: 10 mg/dL (ref 8.7–10.3)
Chloride: 95 mmol/L — ABNORMAL LOW (ref 96–106)
Creatinine, Ser: 0.88 mg/dL (ref 0.57–1.00)
GFR calc Af Amer: 75 mL/min/{1.73_m2} (ref >59)
GFR calc non Af Amer: 65 mL/min/{1.73_m2} (ref >59)
Glucose: 88 mg/dL (ref 65–99)
Phosphorus: 4.4 mg/dL — ABNORMAL HIGH (ref 3.0–4.3)
Potassium: 3.6 mmol/L (ref 3.5–5.2)
Sodium: 142 mmol/L (ref 134–144)

## 2019-11-18 LAB — CBC WITH DIFFERENTIAL/PLATELET
Basophils Absolute: 0.1 10*3/uL (ref 0.0–0.2)
Basos: 1 %
EOS (ABSOLUTE): 0.5 10*3/uL — ABNORMAL HIGH (ref 0.0–0.4)
Eos: 4 %
Hematocrit: 44.6 % (ref 34.0–46.6)
Hemoglobin: 14.4 g/dL (ref 11.1–15.9)
Immature Grans (Abs): 0 10*3/uL (ref 0.0–0.1)
Immature Granulocytes: 0 %
Lymphocytes Absolute: 3.1 10*3/uL (ref 0.7–3.1)
Lymphs: 22 %
MCH: 27.3 pg (ref 26.6–33.0)
MCHC: 32.3 g/dL (ref 31.5–35.7)
MCV: 85 fL (ref 79–97)
Monocytes Absolute: 1.1 10*3/uL — ABNORMAL HIGH (ref 0.1–0.9)
Monocytes: 8 %
Neutrophils Absolute: 9 10*3/uL — ABNORMAL HIGH (ref 1.4–7.0)
Neutrophils: 65 %
Platelets: 339 10*3/uL (ref 150–450)
RBC: 5.27 x10E6/uL (ref 3.77–5.28)
RDW: 13.5 % (ref 11.7–15.4)
WBC: 13.9 10*3/uL — ABNORMAL HIGH (ref 3.4–10.8)

## 2019-11-18 MED ORDER — AMOXICILLIN-POT CLAVULANATE 875-125 MG PO TABS: 1.0000 | ORAL_TABLET | Freq: Two times a day (BID) | ORAL | 0 refills | Status: AC

## 2019-11-18 MED ORDER — METRONIDAZOLE 500 MG PO TABS: 500.0000 mg | ORAL_TABLET | Freq: Two times a day (BID) | ORAL | 0 refills | Status: AC

## 2019-11-18 NOTE — Progress Notes (Unsigned)
Sent in metronidazole and Augmentin to [facility]

## 2019-11-23 ENCOUNTER — Ambulatory Visit
Admission: RE | Admit: 2019-11-23 | Discharge: 2019-11-23 | Disposition: A | Discharge status: Home / Self Care | Payer: Medicare Other | Source: Ambulatory Visit | Attending: Family Medicine | Admitting: Family Medicine

## 2019-11-23 ENCOUNTER — Other Ambulatory Visit: Payer: Self-pay

## 2019-11-23 DIAGNOSIS — Z1231 Encounter for screening mammogram for malignant neoplasm of breast: Secondary | ICD-10-CM | POA: Diagnosis not present

## 2019-12-09 ENCOUNTER — Ambulatory Visit (INDEPENDENT_AMBULATORY_CARE_PROVIDER_SITE_OTHER): Payer: Medicare Other | Admitting: Family Medicine

## 2019-12-09 ENCOUNTER — Telehealth: Payer: Self-pay

## 2019-12-09 ENCOUNTER — Encounter: Payer: Self-pay | Admitting: Family Medicine

## 2019-12-09 ENCOUNTER — Other Ambulatory Visit: Payer: Self-pay

## 2019-12-09 VITALS — BP 120/70 | HR 68 | Temp 98.3°F | Ht 64.0 in | Wt 175.0 lb

## 2019-12-09 DIAGNOSIS — J0100 Acute maxillary sinusitis, unspecified: Secondary | ICD-10-CM | POA: Diagnosis not present

## 2019-12-09 MED ORDER — AMOXICILLIN 500 MG PO CAPS: 500.0000 mg | ORAL_CAPSULE | Freq: Three times a day (TID) | ORAL | 0 refills | Status: DC

## 2019-12-09 NOTE — Telephone Encounter (Signed)
Pt seen in office/ dissatisfied with call center wait time

## 2019-12-09 NOTE — Telephone Encounter (Unsigned)
Copied from [place] [id number]. Topic: Quick Communication - See Telephone Encounter >> Dec 09, 2019  9:24 AM Blase Mess A wrote: CRM for notification. See Telephone encounter for: 12/09/19. Patient is calling to confirm that she can arrive to see Dr. Ronnald Ramp at 10:20am today

## 2019-12-09 NOTE — Progress Notes (Signed)
Date:  12/09/2019   Name:  Kiara Becker   DOB:  10-13-1945   MRN:  [id number]   Chief Complaint: Sore Throat (raw throat, coughing especially at night, has to keep clearing throat)  Sore Throat  This is a new problem. The current episode started in the past 7 days. The problem has been gradually improving. There has been no fever. The pain is moderate. Associated symptoms include congestion. Pertinent negatives include no abdominal pain, coughing, diarrhea, drooling, ear discharge, ear pain, headaches, hoarse voice, plugged ear sensation, neck pain, shortness of breath, stridor, swollen glands, trouble swallowing or vomiting. The treatment provided moderate relief.    Lab Results  Component Value Date   CREATININE 0.88 11/17/2019   BUN 24 11/17/2019   NA 142 11/17/2019   K 3.6 11/17/2019   CL 95 (L) 11/17/2019   CO2 25 11/17/2019   Lab Results  Component Value Date   CHOL 175 08/17/2017   HDL 35 (L) 08/17/2017   LDLCALC 79 08/17/2017   TRIG 306 (H) 08/17/2017   CHOLHDL 5.0 (H) 08/17/2017   Lab Results  Component Value Date   TSH 1.820 10/17/2019   Lab Results  Component Value Date   HGBA1C 5.7 (H) 12/28/2017   Lab Results  Component Value Date   WBC 13.9 (H) 11/17/2019   HGB 14.4 11/17/2019   HCT 44.6 11/17/2019   MCV 85 11/17/2019   PLT 339 11/17/2019   No results found for: ALT, AST, GGT, ALKPHOS, BILITOT   Review of Systems  Constitutional: Negative.  Negative for chills, fatigue, fever and unexpected weight change.  HENT: Positive for congestion. Negative for drooling, ear discharge, ear pain, hoarse voice, rhinorrhea, sinus pressure, sneezing, sore throat and trouble swallowing.   Eyes: Negative for photophobia, pain, discharge, redness and itching.  Respiratory: Negative for cough, shortness of breath, wheezing and stridor.   Gastrointestinal: Negative for abdominal pain, blood in stool, constipation, diarrhea, nausea and vomiting.  Endocrine: Negative  for cold intolerance, heat intolerance, polydipsia, polyphagia and polyuria.  Genitourinary: Negative for dysuria, flank pain, frequency, hematuria, menstrual problem, pelvic pain, urgency, vaginal bleeding and vaginal discharge.  Musculoskeletal: Negative for arthralgias, back pain, myalgias and neck pain.  Skin: Negative for rash.  Allergic/Immunologic: Negative for environmental allergies and food allergies.  Neurological: Negative for dizziness, weakness, light-headedness, numbness and headaches.  Hematological: Negative for adenopathy. Does not bruise/bleed easily.  Psychiatric/Behavioral: Negative for dysphoric mood. The patient is not nervous/anxious.     Patient Active Problem List   Diagnosis Date Noted  . Varicose veins of bilateral lower extremities with other complications 56/21/3086  . Aortic atherosclerosis ([place]) 09/26/2016  . Bladder neoplasm of uncertain malignant potential 08/22/2016  . Reactive airway disease 08/11/2016  . Benign paroxysmal positional vertigo 08/11/2016  . Moderate asthma with acute exacerbation 11/05/2015  . Bronchiolitis 08/14/2014  . Medicare annual wellness visit, subsequent 07/24/2014  . Adiposity 07/24/2014  . Awareness of heartbeats 07/14/2014  . Beat, premature ventricular 07/14/2014  . Essential (primary) hypertension 06/16/2014  . Mixed hyperlipidemia 06/16/2014  . Midline cystocele 09/21/2013  . Frank hematuria 09/06/2013  . Renal mass, right 09/06/2013  . Urge incontinence 09/06/2013    Allergies  Allergen Reactions  . Codeine Sulfate Other (See Comments)  . Ezetimibe Other (See Comments)    Roaring in ears  . Fenofibrate Other (See Comments)    Roaring in ears  . Fenofibric Acid Other (See Comments)    Roaring in ears  . Gemfibrozil  Other (See Comments)    Past Surgical History:  Procedure Laterality Date  . CARPAL TUNNEL RELEASE Bilateral   . CHOLECYSTECTOMY  11/05/2010   Dr. Pat Patrick  . COLECTOMY  10/13/2007   Laparoscopic  assisting ascending colectomy  . COLON SURGERY  2009   remove a mass  . COLONOSCOPY  03/2014   normal- cleared for 5 years- Dr Vira Agar  . COLONOSCOPY WITH PROPOFOL N/A 10/12/2019   Procedure: COLONOSCOPY WITH PROPOFOL;  Surgeon: Toledo, Benay Pike, MD;  Location: [facility] ENDOSCOPY;  Service: Gastroenterology;  Laterality: N/A;  . GALLBLADDER SURGERY    . HERNIA REPAIR    . JOINT REPLACEMENT    . KNEE SURGERY Bilateral    menisacal repair and then replacement  . LAPAROSCOPIC PARTIAL COLECTOMY    . SKIN SURGERY     removal melanoma on arm and eye  . TRIGGER FINGER RELEASE Left   . VAGINAL HYSTERECTOMY  1984    Social History   Tobacco Use  . Smoking status: Never Smoker  . Smokeless tobacco: Never Used  . Tobacco comment: smoking cessation materials not required  Vaping Use  . Vaping Use: Never used  Substance Use Topics  . Alcohol use: No    Alcohol/week: 0.0 standard drinks  . Drug use: No     Medication list has been reviewed and updated.  Current Meds  Medication Sig  . albuterol (VENTOLIN HFA) 108 (90 Base) MCG/ACT inhaler Inhale 2 puffs into the lungs every 6 (six) hours as needed for wheezing or shortness of breath.  . allopurinol (ZYLOPRIM) 100 MG tablet TAKE 1 TABLET(100 MG) BY MOUTH DAILY  . ascorbic acid (VITAMIN C) 1000 MG tablet Take 1 tablet by mouth daily.  Marland Kitchen aspirin 81 MG EC tablet Take 1 tablet by mouth daily.  Marland Kitchen b complex vitamins capsule Take 1 capsule by mouth daily.  . Calcium Carbonate-Vitamin D (CALTRATE 600+D PO) Take 1 tablet by mouth daily.  Marland Kitchen lovastatin (MEVACOR) 20 MG tablet Take by mouth.  . meclizine (ANTIVERT) 25 MG tablet Take 1 tablet (25 mg total) by mouth 3 (three) times daily as needed for dizziness.  . meloxicam (MOBIC) 15 MG tablet TAKE 1 TABLET BY MOUTH DAILY  . mupirocin ointment (BACTROBAN) 2 % 1 application 2 (two) times daily.  Marland Kitchen nystatin cream (MYCOSTATIN) Apply 1 application topically 2 (two) times daily.  Marland Kitchen torsemide (DEMADEX) 10  MG tablet Take 10 mg by mouth as needed (twice a week as needed for ankle swelling). Dr Nehemiah Massed  . triamterene-hydrochlorothiazide (MAXZIDE-25) 37.5-25 MG tablet Take 0.5 tablets by mouth as directed. 3 times qweek- Dr Nehemiah Massed  . Zinc 50 MG TABS Take 1 tablet by mouth daily.   Current Facility-Administered Medications for the 12/09/19 encounter (Office Visit) with Juline Patch, MD  Medication  . albuterol (PROVENTIL) (2.5 MG/3ML) 0.083% nebulizer solution 2.5 mg    PHQ 2/9 Scores 11/17/2019 10/17/2019 09/05/2019 05/17/2019  PHQ - 2 Score 0 0 0 0  PHQ- 9 Score 0 0 - 0    GAD 7 : Generalized Anxiety Score 11/17/2019 10/17/2019 05/17/2019  Nervous, Anxious, on Edge 0 0 0  Control/stop worrying 0 0 0  Worry too much - different things 0 0 0  Trouble relaxing 0 0 0  Restless 0 0 0  Easily annoyed or irritable 0 0 0  Afraid - awful might happen 0 0 0  Total GAD 7 Score 0 0 0  Anxiety Difficulty - Not difficult at all -  BP Readings from Last 3 Encounters:  12/09/19 120/70  11/17/19 120/80  10/12/19 134/70    Physical Exam Vitals and nursing note reviewed.  Constitutional:      Appearance: She is well-developed.  HENT:     Head: Normocephalic.     Right Ear: Tympanic membrane, ear canal and external ear normal. No drainage.     Left Ear: Tympanic membrane, ear canal and external ear normal. No drainage.     Nose: No congestion or rhinorrhea.     Mouth/Throat:     Mouth: Mucous membranes are moist.     Pharynx: Posterior oropharyngeal erythema present.  Eyes:     General: Lids are everted, no foreign bodies appreciated. No scleral icterus.       Left eye: No foreign body or hordeolum.     Conjunctiva/sclera: Conjunctivae normal.     Right eye: Right conjunctiva is not injected.     Left eye: Left conjunctiva is not injected.     Pupils: Pupils are equal, round, and reactive to light.  Neck:     Thyroid: No thyromegaly.     Vascular: No JVD.     Trachea: No tracheal  deviation.  Cardiovascular:     Rate and Rhythm: Normal rate and regular rhythm.     Heart sounds: Normal heart sounds. No murmur heard.  No friction rub. No gallop.   Pulmonary:     Effort: Pulmonary effort is normal. No respiratory distress.     Breath sounds: Normal breath sounds. No wheezing or rales.  Abdominal:     General: Bowel sounds are normal.     Palpations: Abdomen is soft. There is no mass.     Tenderness: There is no abdominal tenderness. There is no guarding or rebound.  Musculoskeletal:        General: No tenderness. Normal range of motion.     Cervical back: Normal range of motion and neck supple.  Lymphadenopathy:     Cervical: No cervical adenopathy.  Skin:    General: Skin is warm.     Findings: No rash.  Neurological:     Mental Status: She is alert and oriented to person, place, and time.     Cranial Nerves: No cranial nerve deficit.     Deep Tendon Reflexes: Reflexes normal.  Psychiatric:        Mood and Affect: Mood is not anxious or depressed.     Wt Readings from Last 3 Encounters:  12/09/19 175 lb (79.4 kg)  11/17/19 175 lb (79.4 kg)  10/17/19 178 lb (80.7 kg)    BP 120/70   Pulse 68   Temp 98.3 F (36.8 C) (Oral)   Ht 5\' 4"  (1.626 m)   Wt 175 lb (79.4 kg)   BMI 30.04 kg/m   Assessment and Plan:  1. Acute maxillary sinusitis, recurrence not specified. acute.  Persistent.  Stable.                  Will initiate amoxicillin 500 mg 3 times a day for 10 days. - amoxicillin (AMOXIL) 500 MG capsule; Take 1 capsule (500 mg total) by mouth 3 (three) times daily.  Dispense: 30 capsule; Refill: 0

## 2020-02-08 DIAGNOSIS — H43812 Vitreous degeneration, left eye: Secondary | ICD-10-CM | POA: Diagnosis not present

## 2020-02-08 DIAGNOSIS — H2513 Age-related nuclear cataract, bilateral: Secondary | ICD-10-CM | POA: Diagnosis not present

## 2020-04-30 ENCOUNTER — Encounter: Payer: Self-pay | Admitting: Family Medicine

## 2020-05-07 ENCOUNTER — Encounter: Payer: Self-pay | Admitting: Family Medicine

## 2020-05-21 ENCOUNTER — Encounter: Payer: Self-pay | Admitting: Family Medicine

## 2020-05-21 ENCOUNTER — Other Ambulatory Visit: Payer: Self-pay

## 2020-05-21 ENCOUNTER — Ambulatory Visit (INDEPENDENT_AMBULATORY_CARE_PROVIDER_SITE_OTHER): Payer: Medicare Other | Admitting: Family Medicine

## 2020-05-21 VITALS — BP 110/80 | HR 80 | Ht 64.0 in | Wt 172.0 lb

## 2020-05-21 DIAGNOSIS — D72829 Elevated white blood cell count, unspecified: Secondary | ICD-10-CM | POA: Diagnosis not present

## 2020-05-21 DIAGNOSIS — R7303 Prediabetes: Secondary | ICD-10-CM | POA: Diagnosis not present

## 2020-05-21 DIAGNOSIS — J4520 Mild intermittent asthma, uncomplicated: Secondary | ICD-10-CM

## 2020-05-21 DIAGNOSIS — R1032 Left lower quadrant pain: Secondary | ICD-10-CM

## 2020-05-21 DIAGNOSIS — R5383 Other fatigue: Secondary | ICD-10-CM | POA: Diagnosis not present

## 2020-05-21 DIAGNOSIS — M519 Unspecified thoracic, thoracolumbar and lumbosacral intervertebral disc disorder: Secondary | ICD-10-CM | POA: Diagnosis not present

## 2020-05-21 DIAGNOSIS — R109 Unspecified abdominal pain: Secondary | ICD-10-CM | POA: Diagnosis not present

## 2020-05-21 DIAGNOSIS — E781 Pure hyperglyceridemia: Secondary | ICD-10-CM | POA: Diagnosis not present

## 2020-05-21 DIAGNOSIS — E790 Hyperuricemia without signs of inflammatory arthritis and tophaceous disease: Secondary | ICD-10-CM

## 2020-05-21 DIAGNOSIS — E782 Mixed hyperlipidemia: Secondary | ICD-10-CM | POA: Diagnosis not present

## 2020-05-21 DIAGNOSIS — Z90710 Acquired absence of both cervix and uterus: Secondary | ICD-10-CM | POA: Insufficient documentation

## 2020-05-21 DIAGNOSIS — R634 Abnormal weight loss: Secondary | ICD-10-CM

## 2020-05-21 MED ORDER — ALBUTEROL SULFATE HFA 108 (90 BASE) MCG/ACT IN AERS: 2.0000 | INHALATION_SPRAY | Freq: Four times a day (QID) | RESPIRATORY_TRACT | 1 refills | Status: DC | PRN

## 2020-05-21 MED ORDER — MELOXICAM 15 MG PO TABS: ORAL_TABLET | ORAL | 1 refills | Status: DC

## 2020-05-21 MED ORDER — ALLOPURINOL 100 MG PO TABS: ORAL_TABLET | ORAL | 1 refills | Status: AC

## 2020-05-21 NOTE — Progress Notes (Signed)
Date:  05/21/2020   Name:  Kiara Becker   DOB:  1945-12-06   MRN:  [id number]   Chief Complaint: Gout, Back Pain (Uses meloxicam), and Asthma (Albuterol inhaler)  Back Pain This is a chronic problem. The current episode started more than 1 year ago. The problem occurs intermittently. The problem has been waxing and waning since onset. The pain is present in the lumbar spine. The quality of the pain is described as aching. The pain is moderate. Associated symptoms include leg pain. Pertinent negatives include no abdominal pain, bladder incontinence, bowel incontinence, chest pain, dysuria, fever, headaches, numbness, paresis, paresthesias, pelvic pain, perianal numbness, tingling, weakness or weight loss. She has tried NSAIDs for the symptoms. The treatment provided moderate relief.  Asthma There is no chest tightness, cough, difficulty breathing, frequent throat clearing, hemoptysis, hoarse voice, shortness of breath, sputum production or wheezing. This is a recurrent problem. The current episode started more than 1 year ago. The problem occurs intermittently. Pertinent negatives include no chest pain, ear pain, fever, headaches, myalgias, rhinorrhea, sneezing, sore throat or weight loss. Her symptoms are alleviated by beta-agonist. She reports moderate improvement on treatment. Her past medical history is significant for asthma.    Lab Results  Component Value Date   CREATININE 0.88 11/17/2019   BUN 24 11/17/2019   NA 142 11/17/2019   K 3.6 11/17/2019   CL 95 (L) 11/17/2019   CO2 25 11/17/2019   Lab Results  Component Value Date   CHOL 175 08/17/2017   HDL 35 (L) 08/17/2017   LDLCALC 79 08/17/2017   TRIG 306 (H) 08/17/2017   CHOLHDL 5.0 (H) 08/17/2017   Lab Results  Component Value Date   TSH 1.820 10/17/2019   Lab Results  Component Value Date   HGBA1C 5.7 (H) 12/28/2017   Lab Results  Component Value Date   WBC 13.9 (H) 11/17/2019   HGB 14.4 11/17/2019   HCT 44.6  11/17/2019   MCV 85 11/17/2019   PLT 339 11/17/2019   No results found for: ALT, AST, GGT, ALKPHOS, BILITOT   Review of Systems  Constitutional: Negative.  Negative for chills, fatigue, fever, unexpected weight change and weight loss.  HENT: Negative for congestion, ear discharge, ear pain, hoarse voice, rhinorrhea, sinus pressure, sneezing and sore throat.   Eyes: Negative for photophobia, pain, discharge, redness and itching.  Respiratory: Negative for cough, hemoptysis, sputum production, shortness of breath, wheezing and stridor.   Cardiovascular: Negative for chest pain.  Gastrointestinal: Negative for abdominal pain, blood in stool, bowel incontinence, constipation, diarrhea, nausea and vomiting.  Endocrine: Negative for cold intolerance, heat intolerance, polydipsia, polyphagia and polyuria.  Genitourinary: Negative for bladder incontinence, dysuria, flank pain, frequency, hematuria, menstrual problem, pelvic pain, urgency, vaginal bleeding and vaginal discharge.  Musculoskeletal: Positive for back pain. Negative for arthralgias and myalgias.  Skin: Negative for rash.  Allergic/Immunologic: Negative for environmental allergies and food allergies.  Neurological: Negative for dizziness, tingling, weakness, light-headedness, numbness, headaches and paresthesias.  Hematological: Negative for adenopathy. Does not bruise/bleed easily.  Psychiatric/Behavioral: Negative for dysphoric mood. The patient is not nervous/anxious.     Patient Active Problem List   Diagnosis Date Noted  . H/O total hysterectomy 05/21/2020  . Varicose veins of bilateral lower extremities with other complications 44/02/270  . Aortic atherosclerosis ([place]) 09/26/2016  . Bladder neoplasm of uncertain malignant potential 08/22/2016  . Reactive airway disease 08/11/2016  . Benign paroxysmal positional vertigo 08/11/2016  . Moderate asthma with acute exacerbation  11/05/2015  . Bronchiolitis 08/14/2014  . Medicare  annual wellness visit, subsequent 07/24/2014  . Adiposity 07/24/2014  . Awareness of heartbeats 07/14/2014  . Beat, premature ventricular 07/14/2014  . Essential (primary) hypertension 06/16/2014  . Mixed hyperlipidemia 06/16/2014  . Midline cystocele 09/21/2013  . Frank hematuria 09/06/2013  . Renal mass, right 09/06/2013  . Urge incontinence 09/06/2013    Allergies  Allergen Reactions  . Codeine Sulfate Other (See Comments)  . Ezetimibe Other (See Comments)    Roaring in ears  . Fenofibrate Other (See Comments)    Roaring in ears  . Fenofibric Acid Other (See Comments)    Roaring in ears  . Gemfibrozil Other (See Comments)    Past Surgical History:  Procedure Laterality Date  . CARPAL TUNNEL RELEASE Bilateral   . CHOLECYSTECTOMY  11/05/2010   Dr. Pat Patrick  . COLECTOMY  10/13/2007   Laparoscopic assisting ascending colectomy  . COLON SURGERY  2009   remove a mass  . COLONOSCOPY  03/2014   normal- cleared for 5 years- Dr Vira Agar  . COLONOSCOPY WITH PROPOFOL N/A 10/12/2019   Procedure: COLONOSCOPY WITH PROPOFOL;  Surgeon: Toledo, Benay Pike, MD;  Location: [facility] ENDOSCOPY;  Service: Gastroenterology;  Laterality: N/A;  . GALLBLADDER SURGERY    . HERNIA REPAIR    . JOINT REPLACEMENT    . KNEE SURGERY Bilateral    menisacal repair and then replacement  . LAPAROSCOPIC PARTIAL COLECTOMY    . SKIN SURGERY     removal melanoma on arm and eye  . TRIGGER FINGER RELEASE Left   . VAGINAL HYSTERECTOMY  1984    Social History   Tobacco Use  . Smoking status: Never Smoker  . Smokeless tobacco: Never Used  . Tobacco comment: smoking cessation materials not required  Vaping Use  . Vaping Use: Never used  Substance Use Topics  . Alcohol use: No    Alcohol/week: 0.0 standard drinks  . Drug use: No     Medication list has been reviewed and updated.  Current Meds  Medication Sig  . albuterol (VENTOLIN HFA) 108 (90 Base) MCG/ACT inhaler Inhale 2 puffs into the lungs every 6 (six)  hours as needed for wheezing or shortness of breath.  . allopurinol (ZYLOPRIM) 100 MG tablet TAKE 1 TABLET(100 MG) BY MOUTH DAILY  . ascorbic acid (VITAMIN C) 1000 MG tablet Take 1 tablet by mouth daily.  Marland Kitchen b complex vitamins capsule Take 1 capsule by mouth daily.  . Calcium Carbonate-Vitamin D (CALTRATE 600+D PO) Take 1 tablet by mouth daily.  Marland Kitchen lovastatin (MEVACOR) 20 MG tablet Take by mouth.  . meclizine (ANTIVERT) 25 MG tablet Take 1 tablet (25 mg total) by mouth 3 (three) times daily as needed for dizziness.  . meloxicam (MOBIC) 15 MG tablet TAKE 1 TABLET BY MOUTH DAILY  . torsemide (DEMADEX) 10 MG tablet Take 10 mg by mouth as needed (twice a week as needed for ankle swelling). Dr Nehemiah Massed  . triamterene-hydrochlorothiazide (MAXZIDE-25) 37.5-25 MG tablet Take 0.5 tablets by mouth as directed. 3 times qweek- Dr Nehemiah Massed  . Zinc 50 MG TABS Take 1 tablet by mouth daily.   Current Facility-Administered Medications for the 05/21/20 encounter (Office Visit) with Juline Patch, MD  Medication  . albuterol (PROVENTIL) (2.5 MG/3ML) 0.083% nebulizer solution 2.5 mg    PHQ 2/9 Scores 05/21/2020 11/17/2019 10/17/2019 09/05/2019  PHQ - 2 Score 0 0 0 0  PHQ- 9 Score 2 0 0 -    GAD 7 : Generalized  Anxiety Score 05/21/2020 11/17/2019 10/17/2019 05/17/2019  Nervous, Anxious, on Edge 0 0 0 0  Control/stop worrying 0 0 0 0  Worry too much - different things 0 0 0 0  Trouble relaxing 0 0 0 0  Restless 0 0 0 0  Easily annoyed or irritable 0 0 0 0  Afraid - awful might happen 0 0 0 0  Total GAD 7 Score 0 0 0 0  Anxiety Difficulty - - Not difficult at all -    BP Readings from Last 3 Encounters:  05/21/20 110/80  12/09/19 120/70  11/17/19 120/80    Physical Exam Vitals and nursing note reviewed.  Constitutional:      Appearance: She is well-developed.  HENT:     Head: Normocephalic.     Right Ear: External ear normal.     Left Ear: External ear normal.     Nose: Nose normal.  Eyes:      General: Lids are everted, no foreign bodies appreciated. No scleral icterus.       Left eye: No foreign body or hordeolum.     Conjunctiva/sclera: Conjunctivae normal.     Right eye: Right conjunctiva is not injected.     Left eye: Left conjunctiva is not injected.     Pupils: Pupils are equal, round, and reactive to light.  Neck:     Thyroid: No thyromegaly.     Vascular: No JVD.     Trachea: No tracheal deviation.  Cardiovascular:     Rate and Rhythm: Normal rate and regular rhythm.     Heart sounds: Normal heart sounds. No murmur heard. No friction rub. No gallop.   Pulmonary:     Effort: Pulmonary effort is normal. No respiratory distress.     Breath sounds: Normal breath sounds. No wheezing or rales.  Abdominal:     General: Bowel sounds are normal.     Palpations: Abdomen is soft. There is no mass.     Tenderness: There is no abdominal tenderness. There is no guarding or rebound.  Musculoskeletal:        General: No tenderness. Normal range of motion.     Cervical back: Normal range of motion and neck supple.  Lymphadenopathy:     Cervical: No cervical adenopathy.  Skin:    General: Skin is warm.     Findings: No rash.  Neurological:     Mental Status: She is alert and oriented to person, place, and time.     Cranial Nerves: No cranial nerve deficit.     Deep Tendon Reflexes: Reflexes normal.  Psychiatric:        Mood and Affect: Mood is not anxious or depressed.     Wt Readings from Last 3 Encounters:  05/21/20 172 lb (78 kg)  12/09/19 175 lb (79.4 kg)  11/17/19 175 lb (79.4 kg)    BP 110/80   Pulse 80   Ht 5\' 4"  (1.626 m)   Wt 172 lb (78 kg)   BMI 29.52 kg/m   Assessment and Plan: 1. Fatigue, unspecified type Chronic.  Persistent.  Stable.  Patient continues to have fatigue of a significant nature.  She denies any depression and work-up has been so far unremarkable.  We will continue with evaluation of an A1c, CBC (leukocytosis) and sedimentation rate. -  Hemoglobin A1c - CBC with Differential/Platelet - Sedimentation rate  2. Flank pain Chronic.  Episodic.  Persistent.  Patient continues to have a discomfort in the left lower quadrant and  flank area.  Given her recent weight loss and continued pain we will obtain abdominal and pelvic ultrasound. - US Abdomen Complete; Future - US Pelvic Complete With Transvaginal; Future  3. Reactive airway disease, mild intermittent, uncomplicated Chronic.  Controlled.  Stable.  Continue with albuterol inhaler 1 to 2 puffs every 6 hours. - albuterol (VENTOLIN HFA) 108 (90 Base) MCG/ACT inhaler; Inhale 2 puffs into the lungs every 6 (six) hours as needed for wheezing or shortness of breath.  Dispense: 18 g; Refill: 1  4. Hyperuricemia Patient has history of hyper uric acidemia.  She is tolerating this well and will continue allopurinol 100 mg daily. - allopurinol (ZYLOPRIM) 100 MG tablet; TAKE 1 TABLET(100 MG) BY MOUTH DAILY  Dispense: 90 tablet; Refill: 1  5. Thoracic disc disease Chronic.  Controlled.  Stable.  Continue meloxicam 15 mg as needed. - meloxicam (MOBIC) 15 MG tablet; TAKE 1 TABLET BY MOUTH DAILY  Dispense: 90 tablet; Refill: 1  6. Prediabetes As noted that previous A1c was 5.7 and has had some elevations of glucose in the past.  Will check A1c to see if this is remaining stable. - Hemoglobin A1c  7. H/O total hysterectomy Remote history of total hysterectomy without need of future cervical Pap smears.  8. Leukocytosis, unspecified type Patient has a history of leukocytosis but this was previous when she was having some possible diverticular symptoms.  We will recheck a CBC if leukocytosis is continuing. - CBC with Differential/Platelet  9. Weight loss, non-intentional Patient has been on a diet and has been trying to lose weight and has a 45 pound weight loss by design but given that there is also an associated fatigue we will reassess with evaluation.  10. Mixed  hyperlipidemia History of mixed hyperlipidemia.  11. Hypertriglyceridemia In particular patient has hypertriglyceridemia and has been noted to have some steatosis that is noted by her cardiologist.  Patient's been unable to tolerate Vascepa and can only treat this with a dietary approach.  12. Left lower quadrant abdominal pain As noted above patient has left lower quadrant abdominal pain along with flank discomfort.  We will obtain an ultrasound of the abdomen to see if there is any suggestion of concerns that would note this pain or recent weight loss/leukocytosis. - US Abdomen Complete; Future

## 2020-05-22 ENCOUNTER — Encounter: Payer: Self-pay | Admitting: Family Medicine

## 2020-05-22 LAB — HEMOGLOBIN A1C
Est. average glucose Bld gHb Est-mCnc: 120 mg/dL
Hgb A1c MFr Bld: 5.8 % — ABNORMAL HIGH (ref 4.8–5.6)

## 2020-05-22 LAB — CBC WITH DIFFERENTIAL/PLATELET
Basophils Absolute: 0.1 10*3/uL (ref 0.0–0.2)
Basos: 1 %
EOS (ABSOLUTE): 0.6 10*3/uL — ABNORMAL HIGH (ref 0.0–0.4)
Eos: 5 %
Hematocrit: 42.1 % (ref 34.0–46.6)
Hemoglobin: 14.4 g/dL (ref 11.1–15.9)
Immature Grans (Abs): 0 10*3/uL (ref 0.0–0.1)
Immature Granulocytes: 0 %
Lymphocytes Absolute: 2.7 10*3/uL (ref 0.7–3.1)
Lymphs: 23 %
MCH: 28.7 pg (ref 26.6–33.0)
MCHC: 34.2 g/dL (ref 31.5–35.7)
MCV: 84 fL (ref 79–97)
Monocytes Absolute: 1 10*3/uL — ABNORMAL HIGH (ref 0.1–0.9)
Monocytes: 8 %
Neutrophils Absolute: 7.4 10*3/uL — ABNORMAL HIGH (ref 1.4–7.0)
Neutrophils: 63 %
Platelets: 315 10*3/uL (ref 150–450)
RBC: 5.02 x10E6/uL (ref 3.77–5.28)
RDW: 13.6 % (ref 11.7–15.4)
WBC: 11.8 10*3/uL — ABNORMAL HIGH (ref 3.4–10.8)

## 2020-05-22 LAB — SEDIMENTATION RATE: Sed Rate: 7 mm/hr (ref 0–40)

## 2020-06-06 DIAGNOSIS — Z85820 Personal history of malignant melanoma of skin: Secondary | ICD-10-CM | POA: Diagnosis not present

## 2020-06-06 DIAGNOSIS — Z872 Personal history of diseases of the skin and subcutaneous tissue: Secondary | ICD-10-CM | POA: Diagnosis not present

## 2020-06-06 DIAGNOSIS — Z859 Personal history of malignant neoplasm, unspecified: Secondary | ICD-10-CM | POA: Diagnosis not present

## 2020-06-06 DIAGNOSIS — L570 Actinic keratosis: Secondary | ICD-10-CM | POA: Diagnosis not present

## 2020-06-06 DIAGNOSIS — L821 Other seborrheic keratosis: Secondary | ICD-10-CM | POA: Diagnosis not present

## 2020-06-06 DIAGNOSIS — L578 Other skin changes due to chronic exposure to nonionizing radiation: Secondary | ICD-10-CM | POA: Diagnosis not present

## 2020-06-06 DIAGNOSIS — Z86018 Personal history of other benign neoplasm: Secondary | ICD-10-CM | POA: Diagnosis not present

## 2020-06-27 ENCOUNTER — Ambulatory Visit: Payer: Self-pay

## 2020-06-27 NOTE — Telephone Encounter (Signed)
Pt. Noticed a knot at right knee yesterday. It is below the knee. Quarter-size. Painful to touch. No swelling to leg - "no more than usual." Area is reddened.Appointment made for tomorrow. Reason for Disposition . [1] Redness of the skin AND [2] no fever  Answer Assessment - Initial Assessment Questions 1. LOCATION and RADIATION: "Where is the pain located?"      Right side below the knee 2. QUALITY: "What does the pain feel like?"  (e.g., sharp, dull, aching, burning)     Dull 3. SEVERITY: "How bad is the pain?" "What does it keep you from doing?"   (Scale 1-10; or mild, moderate, severe)   -  MILD (1-3): doesn't interfere with normal activities    -  MODERATE (4-7): interferes with normal activities (e.g., work or school) or awakens from sleep, limping    -  SEVERE (8-10): excruciating pain, unable to do any normal activities, unable to walk     5 4. ONSET: "When did the pain start?" "Does it come and go, or is it there all the time?"     Yesterday 5. RECURRENT: "Have you had this pain before?" If Yes, ask: "When, and what happened then?"    Yes 6. SETTING: "Has there been any recent work, exercise or other activity that involved that part of the body?"      No 7. AGGRAVATING FACTORS: "What makes the knee pain worse?" (e.g., walking, climbing stairs, running)     Touching it 8. ASSOCIATED SYMPTOMS: "Is there any swelling or redness of the knee?"     No swelling  9. OTHER SYMPTOMS: "Do you have any other symptoms?" (e.g., chest pain, difficulty breathing, fever, calf pain)     No 10. PREGNANCY: "Is there any chance you are pregnant?" "When was your last menstrual period?"       No  Protocols used: KNEE PAIN-A-AH

## 2020-06-28 ENCOUNTER — Ambulatory Visit (INDEPENDENT_AMBULATORY_CARE_PROVIDER_SITE_OTHER): Payer: Medicare Other | Admitting: Family Medicine

## 2020-06-28 ENCOUNTER — Other Ambulatory Visit: Payer: Self-pay

## 2020-06-28 ENCOUNTER — Encounter: Payer: Self-pay | Admitting: Family Medicine

## 2020-06-28 VITALS — BP 130/68 | HR 72 | Ht 64.0 in | Wt 171.0 lb

## 2020-06-28 DIAGNOSIS — L723 Sebaceous cyst: Secondary | ICD-10-CM

## 2020-06-28 MED ORDER — CEPHALEXIN 500 MG PO CAPS: 500.0000 mg | ORAL_CAPSULE | Freq: Three times a day (TID) | ORAL | 0 refills | Status: DC

## 2020-06-28 NOTE — Progress Notes (Signed)
Date:  06/28/2020   Name:  Kiara Becker   DOB:  27-May-1945   MRN:  [id number]   Chief Complaint: Cyst (Has a knot next to her right knee. Painful to touch. Unsure of cause. )  Knee Pain  The incident occurred 2 days ago. The pain is present in the left knee (medial aspect). The pain is mild. The pain has been constant since onset. Pertinent negatives include no inability to bear weight, loss of motion, loss of sensation, muscle weakness, numbness or tingling. The symptoms are aggravated by palpation.    Lab Results  Component Value Date   CREATININE 0.88 11/17/2019   BUN 24 11/17/2019   NA 142 11/17/2019   K 3.6 11/17/2019   CL 95 (L) 11/17/2019   CO2 25 11/17/2019   Lab Results  Component Value Date   CHOL 175 08/17/2017   HDL 35 (L) 08/17/2017   LDLCALC 79 08/17/2017   TRIG 306 (H) 08/17/2017   CHOLHDL 5.0 (H) 08/17/2017   Lab Results  Component Value Date   TSH 1.820 10/17/2019   Lab Results  Component Value Date   HGBA1C 5.8 (H) 05/21/2020   Lab Results  Component Value Date   WBC 11.8 (H) 05/21/2020   HGB 14.4 05/21/2020   HCT 42.1 05/21/2020   MCV 84 05/21/2020   PLT 315 05/21/2020   No results found for: ALT, AST, GGT, ALKPHOS, BILITOT   Review of Systems  Constitutional: Negative.  Negative for chills, fatigue, fever and unexpected weight change.  HENT: Negative for congestion, ear discharge, ear pain, rhinorrhea, sinus pressure, sneezing and sore throat.   Eyes: Negative for photophobia, pain, discharge, redness and itching.  Respiratory: Negative for cough, shortness of breath, wheezing and stridor.   Cardiovascular: Negative for chest pain, palpitations and leg swelling.  Gastrointestinal: Negative for abdominal pain, blood in stool, constipation, diarrhea, nausea and vomiting.  Endocrine: Negative for cold intolerance, heat intolerance, polydipsia, polyphagia and polyuria.  Genitourinary: Negative for dysuria, flank pain, frequency, hematuria,  menstrual problem, pelvic pain, urgency, vaginal bleeding and vaginal discharge.  Musculoskeletal: Negative for arthralgias, back pain and myalgias.  Skin: Negative for rash.  Allergic/Immunologic: Negative for environmental allergies and food allergies.  Neurological: Negative for dizziness, tingling, weakness, light-headedness, numbness and headaches.  Hematological: Negative for adenopathy. Does not bruise/bleed easily.  Psychiatric/Behavioral: Negative for dysphoric mood. The patient is not nervous/anxious.     Patient Active Problem List   Diagnosis Date Noted  . H/O total hysterectomy 05/21/2020  . Varicose veins of bilateral lower extremities with other complications 32/95/1884  . Aortic atherosclerosis ([place]) 09/26/2016  . Bladder neoplasm of uncertain malignant potential 08/22/2016  . Reactive airway disease 08/11/2016  . Benign paroxysmal positional vertigo 08/11/2016  . Moderate asthma with acute exacerbation 11/05/2015  . Bronchiolitis 08/14/2014  . Medicare annual wellness visit, subsequent 07/24/2014  . Adiposity 07/24/2014  . Awareness of heartbeats 07/14/2014  . Beat, premature ventricular 07/14/2014  . Essential (primary) hypertension 06/16/2014  . Mixed hyperlipidemia 06/16/2014  . Midline cystocele 09/21/2013  . Frank hematuria 09/06/2013  . Renal mass, right 09/06/2013  . Urge incontinence 09/06/2013    Allergies  Allergen Reactions  . Codeine Sulfate Other (See Comments)  . Ezetimibe Other (See Comments)    Roaring in ears  . Fenofibrate Other (See Comments)    Roaring in ears  . Fenofibric Acid Other (See Comments)    Roaring in ears  . Gemfibrozil Other (See Comments)  Past Surgical History:  Procedure Laterality Date  . CARPAL TUNNEL RELEASE Bilateral   . CHOLECYSTECTOMY  11/05/2010   Dr. Pat Patrick  . COLECTOMY  10/13/2007   Laparoscopic assisting ascending colectomy  . COLON SURGERY  2009   remove a mass  . COLONOSCOPY  03/2014   normal- cleared  for 5 years- Dr Vira Agar  . COLONOSCOPY WITH PROPOFOL N/A 10/12/2019   Procedure: COLONOSCOPY WITH PROPOFOL;  Surgeon: Toledo, Benay Pike, MD;  Location: [facility] ENDOSCOPY;  Service: Gastroenterology;  Laterality: N/A;  . GALLBLADDER SURGERY    . HERNIA REPAIR    . JOINT REPLACEMENT    . KNEE SURGERY Bilateral    menisacal repair and then replacement  . LAPAROSCOPIC PARTIAL COLECTOMY    . SKIN SURGERY     removal melanoma on arm and eye  . TRIGGER FINGER RELEASE Left   . VAGINAL HYSTERECTOMY  1984    Social History   Tobacco Use  . Smoking status: Never Smoker  . Smokeless tobacco: Never Used  . Tobacco comment: smoking cessation materials not required  Vaping Use  . Vaping Use: Never used  Substance Use Topics  . Alcohol use: No    Alcohol/week: 0.0 standard drinks  . Drug use: No     Medication list has been reviewed and updated.  Current Meds  Medication Sig  . albuterol (VENTOLIN HFA) 108 (90 Base) MCG/ACT inhaler Inhale 2 puffs into the lungs every 6 (six) hours as needed for wheezing or shortness of breath.  . allopurinol (ZYLOPRIM) 100 MG tablet TAKE 1 TABLET(100 MG) BY MOUTH DAILY  . ascorbic acid (VITAMIN C) 1000 MG tablet Take 1 tablet by mouth daily.  Marland Kitchen b complex vitamins capsule Take 1 capsule by mouth daily.  . Calcium Carbonate-Vitamin D (CALTRATE 600+D PO) Take 1 tablet by mouth daily.  Marland Kitchen icosapent Ethyl (VASCEPA) 1 g capsule Take 2 capsules by mouth in the morning and at bedtime.  . meclizine (ANTIVERT) 25 MG tablet Take 1 tablet (25 mg total) by mouth 3 (three) times daily as needed for dizziness.  . meloxicam (MOBIC) 15 MG tablet TAKE 1 TABLET BY MOUTH DAILY  . torsemide (DEMADEX) 10 MG tablet Take 10 mg by mouth as needed (twice a week as needed for ankle swelling). Dr Nehemiah Massed  . triamterene-hydrochlorothiazide (MAXZIDE-25) 37.5-25 MG tablet Take 0.5 tablets by mouth as directed. 3 times qweek- Dr Nehemiah Massed  . Zinc 50 MG TABS Take 1 tablet by mouth daily.    Current Facility-Administered Medications for the 06/28/20 encounter (Office Visit) with Juline Patch, MD  Medication  . albuterol (PROVENTIL) (2.5 MG/3ML) 0.083% nebulizer solution 2.5 mg    PHQ 2/9 Scores 06/28/2020 05/21/2020 11/17/2019 10/17/2019  PHQ - 2 Score 0 0 0 0  PHQ- 9 Score 0 2 0 0    GAD 7 : Generalized Anxiety Score 06/28/2020 05/21/2020 11/17/2019 10/17/2019  Nervous, Anxious, on Edge 0 0 0 0  Control/stop worrying 0 0 0 0  Worry too much - different things 0 0 0 0  Trouble relaxing 0 0 0 0  Restless 0 0 0 0  Easily annoyed or irritable 0 0 0 0  Afraid - awful might happen 0 0 0 0  Total GAD 7 Score 0 0 0 0  Anxiety Difficulty Not difficult at all - - Not difficult at all    BP Readings from Last 3 Encounters:  06/28/20 130/68  05/21/20 110/80  12/09/19 120/70    Physical Exam Vitals and  nursing note reviewed.     Wt Readings from Last 3 Encounters:  06/28/20 171 lb (77.6 kg)  05/21/20 172 lb (78 kg)  12/09/19 175 lb (79.4 kg)    BP 130/68   Pulse 72   Ht 5\' 4"  (1.626 m)   Wt 171 lb (77.6 kg)   BMI 29.35 kg/m   Assessment and Plan:  1. Sebaceous cyst Acute.  Persistent.  Stable.  Patient has an area of erythema slightly raised with a palpable dermal component to it.  This seems to be a small puckering with dark central area suggesting may be symptoms sebaceous material.  There is some tenderness on palpation that I suspect suggest that this is a small sebaceous cyst with some early infection.  We will treat with cephalexin 500 mg 3 times a day and refer to dermatology for evaluation and perhaps further treatment. - cephALEXin (KEFLEX) 500 MG capsule; Take 1 capsule (500 mg total) by mouth 3 (three) times daily.  Dispense: 30 capsule; Refill: 0 - Ambulatory referral to Dermatology

## 2020-07-09 ENCOUNTER — Ambulatory Visit
Admission: RE | Admit: 2020-07-09 | Discharge: 2020-07-09 | Disposition: A | Discharge status: Home / Self Care | Payer: Medicare Other | Source: Ambulatory Visit | Attending: Family Medicine | Admitting: Family Medicine

## 2020-07-09 ENCOUNTER — Other Ambulatory Visit: Payer: Self-pay

## 2020-07-09 DIAGNOSIS — Z9049 Acquired absence of other specified parts of digestive tract: Secondary | ICD-10-CM | POA: Diagnosis not present

## 2020-07-09 DIAGNOSIS — R109 Unspecified abdominal pain: Secondary | ICD-10-CM | POA: Insufficient documentation

## 2020-07-09 DIAGNOSIS — R634 Abnormal weight loss: Secondary | ICD-10-CM | POA: Diagnosis not present

## 2020-07-09 DIAGNOSIS — R10A0 Flank pain, unspecified side: Secondary | ICD-10-CM

## 2020-07-09 DIAGNOSIS — R1032 Left lower quadrant pain: Secondary | ICD-10-CM | POA: Insufficient documentation

## 2020-07-09 DIAGNOSIS — K7689 Other specified diseases of liver: Secondary | ICD-10-CM | POA: Diagnosis not present

## 2020-07-10 ENCOUNTER — Encounter: Payer: Self-pay | Admitting: Family Medicine

## 2020-07-26 ENCOUNTER — Encounter: Payer: Self-pay | Admitting: Family Medicine

## 2020-07-26 ENCOUNTER — Other Ambulatory Visit: Payer: Self-pay

## 2020-07-26 ENCOUNTER — Ambulatory Visit (INDEPENDENT_AMBULATORY_CARE_PROVIDER_SITE_OTHER): Payer: Medicare Other | Admitting: Family Medicine

## 2020-07-26 VITALS — BP 110/70 | HR 68 | Temp 98.7°F | Ht 64.0 in | Wt 172.0 lb

## 2020-07-26 DIAGNOSIS — J0100 Acute maxillary sinusitis, unspecified: Secondary | ICD-10-CM | POA: Diagnosis not present

## 2020-07-26 MED ORDER — AZITHROMYCIN 250 MG PO TABS: ORAL_TABLET | ORAL | 0 refills | Status: AC

## 2020-07-26 NOTE — Progress Notes (Signed)
Date:  07/26/2020   Name:  Kiara Becker   DOB:  [birth year]   MRN:  [id number]   Chief Complaint: Sinusitis ("Feel like I am in a fog"- tested positive home test on 07/17/20. Has drainage and headache)  Sinusitis This is a new problem. The current episode started more than 1 year ago. The problem has been gradually worsening since onset. There has been no fever. The pain is moderate. Associated symptoms include congestion, a hoarse voice and sinus pressure. Pertinent negatives include no chills, coughing, diaphoresis, ear pain, headaches, neck pain, shortness of breath, sneezing, sore throat or swollen glands. (Facial discomfort) The treatment provided no relief.   Lab Results  Component Value Date   CREATININE 0.88 11/17/2019   BUN 24 11/17/2019   NA 142 11/17/2019   K 3.6 11/17/2019   CL 95 (L) 11/17/2019   CO2 25 11/17/2019   Lab Results  Component Value Date   CHOL 175 08/17/2017   HDL 35 (L) 08/17/2017   LDLCALC 79 08/17/2017   TRIG 306 (H) 08/17/2017   CHOLHDL 5.0 (H) 08/17/2017   Lab Results  Component Value Date   TSH 1.820 10/17/2019   Lab Results  Component Value Date   HGBA1C 5.8 (H) 05/21/2020   Lab Results  Component Value Date   WBC 11.8 (H) 05/21/2020   HGB 14.4 05/21/2020   HCT 42.1 05/21/2020   MCV 84 05/21/2020   PLT 315 05/21/2020   No results found for: ALT, AST, GGT, ALKPHOS, BILITOT   Review of Systems  Constitutional:  Negative for chills, diaphoresis and fever.  HENT:  Positive for congestion, hoarse voice and sinus pressure. Negative for drooling, ear discharge, ear pain, sneezing and sore throat.   Respiratory:  Negative for cough, shortness of breath and wheezing.   Cardiovascular:  Negative for chest pain, palpitations and leg swelling.  Gastrointestinal:  Negative for abdominal pain, blood in stool, constipation, diarrhea and nausea.  Endocrine: Negative for polydipsia.  Genitourinary:  Negative for dysuria, frequency, hematuria and  urgency.  Musculoskeletal:  Negative for back pain, myalgias and neck pain.  Skin:  Negative for rash.  Allergic/Immunologic: Negative for environmental allergies.  Neurological:  Negative for dizziness and headaches.  Hematological:  Does not bruise/bleed easily.  Psychiatric/Behavioral:  Negative for suicidal ideas. The patient is not nervous/anxious.    Patient Active Problem List   Diagnosis Date Noted   H/O total hysterectomy 05/21/2020   Varicose veins of bilateral lower extremities with other complications 56/86/1683   Aortic atherosclerosis ([place]) 09/26/2016   Bladder neoplasm of uncertain malignant potential 08/22/2016   Reactive airway disease 08/11/2016   Benign paroxysmal positional vertigo 08/11/2016   Moderate asthma with acute exacerbation 11/05/2015   Bronchiolitis 08/14/2014   Medicare annual wellness visit, subsequent 07/24/2014   Adiposity 07/24/2014   Awareness of heartbeats 07/14/2014   Beat, premature ventricular 07/14/2014   Essential (primary) hypertension 06/16/2014   Mixed hyperlipidemia 06/16/2014   Midline cystocele 09/21/2013   Pilar Plate hematuria 09/06/2013   Renal mass, right 09/06/2013   Urge incontinence 09/06/2013    Allergies  Allergen Reactions   Codeine Sulfate Other (See Comments)   Ezetimibe Other (See Comments)    Roaring in ears   Fenofibrate Other (See Comments)    Roaring in ears   Fenofibric Acid Other (See Comments)    Roaring in ears   Gemfibrozil Other (See Comments)    Past Surgical History:  Procedure Laterality Date   CARPAL TUNNEL RELEASE  Bilateral    CHOLECYSTECTOMY  11/05/2010   Dr. Pat Patrick   COLECTOMY  10/13/2007   Laparoscopic assisting ascending colectomy   COLON SURGERY  2009   remove a mass   COLONOSCOPY  03/2014   normal- cleared for 5 years- Dr Vira Agar   COLONOSCOPY WITH PROPOFOL N/A 10/12/2019   Procedure: COLONOSCOPY WITH PROPOFOL;  Surgeon: Toledo, Benay Pike, MD;  Location: [facility] ENDOSCOPY;  Service:  Gastroenterology;  Laterality: N/A;   GALLBLADDER SURGERY     HERNIA REPAIR     JOINT REPLACEMENT     KNEE SURGERY Bilateral    menisacal repair and then replacement   LAPAROSCOPIC PARTIAL COLECTOMY     SKIN SURGERY     removal melanoma on arm and eye   TRIGGER FINGER RELEASE Left    VAGINAL HYSTERECTOMY  1984    Social History   Tobacco Use   Smoking status: Never   Smokeless tobacco: Never   Tobacco comments:    smoking cessation materials not required  Vaping Use   Vaping Use: Never used  Substance Use Topics   Alcohol use: No    Alcohol/week: 0.0 standard drinks   Drug use: No     Medication list has been reviewed and updated.  Current Meds  Medication Sig   albuterol (VENTOLIN HFA) 108 (90 Base) MCG/ACT inhaler Inhale 2 puffs into the lungs every 6 (six) hours as needed for wheezing or shortness of breath.   allopurinol (ZYLOPRIM) 100 MG tablet TAKE 1 TABLET(100 MG) BY MOUTH DAILY   ascorbic acid (VITAMIN C) 1000 MG tablet Take 1 tablet by mouth daily.   b complex vitamins capsule Take 1 capsule by mouth daily.   Calcium Carbonate-Vitamin D (CALTRATE 600+D PO) Take 1 tablet by mouth daily.   cephALEXin (KEFLEX) 500 MG capsule Take 1 capsule (500 mg total) by mouth 3 (three) times daily.   icosapent Ethyl (VASCEPA) 1 g capsule Take 2 capsules by mouth in the morning and at bedtime.   meclizine (ANTIVERT) 25 MG tablet Take 1 tablet (25 mg total) by mouth 3 (three) times daily as needed for dizziness.   meloxicam (MOBIC) 15 MG tablet TAKE 1 TABLET BY MOUTH DAILY   torsemide (DEMADEX) 10 MG tablet Take 10 mg by mouth as needed (twice a week as needed for ankle swelling). Dr Nehemiah Massed   triamterene-hydrochlorothiazide (MAXZIDE-25) 37.5-25 MG tablet Take 0.5 tablets by mouth as directed. 3 times qweek- Dr Nehemiah Massed   Zinc 50 MG TABS Take 1 tablet by mouth daily.   Current Facility-Administered Medications for the 07/26/20 encounter (Office Visit) with Juline Patch, MD   Medication   albuterol (PROVENTIL) (2.5 MG/3ML) 0.083% nebulizer solution 2.5 mg    PHQ 2/9 Scores 06/28/2020 05/21/2020 11/17/2019 10/17/2019  PHQ - 2 Score 0 0 0 0  PHQ- 9 Score 0 2 0 0    GAD 7 : Generalized Anxiety Score 06/28/2020 05/21/2020 11/17/2019 10/17/2019  Nervous, Anxious, on Edge 0 0 0 0  Control/stop worrying 0 0 0 0  Worry too much - different things 0 0 0 0  Trouble relaxing 0 0 0 0  Restless 0 0 0 0  Easily annoyed or irritable 0 0 0 0  Afraid - awful might happen 0 0 0 0  Total GAD 7 Score 0 0 0 0  Anxiety Difficulty Not difficult at all - - Not difficult at all    BP Readings from Last 3 Encounters:  07/26/20 110/70  06/28/20 130/68  05/21/20  110/80    Physical Exam Vitals and nursing note reviewed.  Constitutional:      General: She is not in acute distress.    Appearance: She is not diaphoretic.  HENT:     Head: Normocephalic and atraumatic.     Right Ear: Tympanic membrane, ear canal and external ear normal. There is no impacted cerumen.     Left Ear: Tympanic membrane, ear canal and external ear normal. There is no impacted cerumen.     Nose: No congestion or rhinorrhea.     Right Sinus: Maxillary sinus tenderness present. No frontal sinus tenderness.     Left Sinus: No maxillary sinus tenderness or frontal sinus tenderness.  Eyes:     General:        Right eye: No discharge.        Left eye: No discharge.     Conjunctiva/sclera: Conjunctivae normal.     Pupils: Pupils are equal, round, and reactive to light.  Neck:     Thyroid: No thyromegaly.     Vascular: No JVD.  Cardiovascular:     Rate and Rhythm: Normal rate and regular rhythm.     Heart sounds: Normal heart sounds. No murmur heard.   No friction rub. No gallop.  Pulmonary:     Effort: Pulmonary effort is normal.     Breath sounds: Normal breath sounds. No wheezing or rhonchi.  Abdominal:     General: Bowel sounds are normal.     Palpations: Abdomen is soft. There is no mass.      Tenderness: There is no abdominal tenderness. There is no guarding.  Musculoskeletal:        General: Normal range of motion.     Cervical back: Normal range of motion and neck supple.  Lymphadenopathy:     Cervical: No cervical adenopathy.  Skin:    General: Skin is warm and dry.  Neurological:     Mental Status: She is alert.     Deep Tendon Reflexes: Reflexes are normal and symmetric.    Wt Readings from Last 3 Encounters:  07/26/20 172 lb (78 kg)  06/28/20 171 lb (77.6 kg)  05/21/20 172 lb (78 kg)    BP 110/70   Pulse 68   Temp 98.7 F (37.1 C) (Oral)   Ht 5\' 4"  (1.626 m)   Wt 172 lb (78 kg)   SpO2 98%   BMI 29.52 kg/m   Assessment and Plan:  1. Acute non-recurrent maxillary sinusitis Acute.  Persistent.  Uncomplicated.  Patient has tenderness over her right maxillary sinus consistent with a right maxillary sinusitis.  We will initiate a azithromycin to 50 mg 2 today followed by 1 a day for 4 days. - azithromycin (ZITHROMAX) 250 MG tablet; Take 2 tablets on day 1, then 1 tablet daily on days 2 through 5  Dispense: 6 tablet; Refill: 0

## 2020-08-02 ENCOUNTER — Other Ambulatory Visit: Payer: Self-pay

## 2020-08-02 ENCOUNTER — Encounter: Payer: Self-pay | Admitting: Family Medicine

## 2020-08-02 ENCOUNTER — Ambulatory Visit (INDEPENDENT_AMBULATORY_CARE_PROVIDER_SITE_OTHER): Payer: Medicare Other | Admitting: Family Medicine

## 2020-08-02 VITALS — BP 122/78 | HR 72 | Ht 64.0 in | Wt 171.0 lb

## 2020-08-02 DIAGNOSIS — J309 Allergic rhinitis, unspecified: Secondary | ICD-10-CM | POA: Diagnosis not present

## 2020-08-02 MED ORDER — MONTELUKAST SODIUM 10 MG PO TABS: 10.0000 mg | ORAL_TABLET | Freq: Every day | ORAL | 3 refills | Status: DC

## 2020-08-02 NOTE — Progress Notes (Signed)
Date:  08/02/2020   Name:  Kiara Becker   DOB:  10-21-1945   MRN:  [id number]   Chief Complaint: Follow-up (Had cephalexin on 5/26 for cyst and then ZPack 7 days ago- still having "head stopped up" and dull headache)  Sinus Problem This is a recurrent problem. The current episode started more than 1 month ago. The problem has been waxing and waning since onset. There has been no fever. The pain is mild. Associated symptoms include congestion, headaches, a hoarse voice and sinus pressure. Pertinent negatives include no chills, coughing, diaphoresis, ear pain, neck pain, shortness of breath, sneezing, sore throat or swollen glands. The treatment provided moderate relief.   Lab Results  Component Value Date   CREATININE 0.88 11/17/2019   BUN 24 11/17/2019   NA 142 11/17/2019   K 3.6 11/17/2019   CL 95 (L) 11/17/2019   CO2 25 11/17/2019   Lab Results  Component Value Date   CHOL 175 08/17/2017   HDL 35 (L) 08/17/2017   LDLCALC 79 08/17/2017   TRIG 306 (H) 08/17/2017   CHOLHDL 5.0 (H) 08/17/2017   Lab Results  Component Value Date   TSH 1.820 10/17/2019   Lab Results  Component Value Date   HGBA1C 5.8 (H) 05/21/2020   Lab Results  Component Value Date   WBC 11.8 (H) 05/21/2020   HGB 14.4 05/21/2020   HCT 42.1 05/21/2020   MCV 84 05/21/2020   PLT 315 05/21/2020   No results found for: ALT, AST, GGT, ALKPHOS, BILITOT   Review of Systems  Constitutional:  Negative for chills, diaphoresis and fever.  HENT:  Positive for congestion, hoarse voice and sinus pressure. Negative for drooling, ear discharge, ear pain, sneezing and sore throat.   Respiratory:  Negative for cough, shortness of breath and wheezing.   Cardiovascular:  Negative for chest pain, palpitations and leg swelling.  Gastrointestinal:  Negative for abdominal pain, blood in stool, constipation, diarrhea and nausea.  Endocrine: Negative for polydipsia.  Genitourinary:  Negative for dysuria, frequency,  hematuria and urgency.  Musculoskeletal:  Negative for back pain, myalgias and neck pain.  Skin:  Negative for rash.  Allergic/Immunologic: Negative for environmental allergies.  Neurological:  Positive for headaches. Negative for dizziness.  Hematological:  Does not bruise/bleed easily.  Psychiatric/Behavioral:  Negative for suicidal ideas. The patient is not nervous/anxious.    Patient Active Problem List   Diagnosis Date Noted   H/O total hysterectomy 05/21/2020   Varicose veins of bilateral lower extremities with other complications 74/16/3845   Aortic atherosclerosis ([place]) 09/26/2016   Bladder neoplasm of uncertain malignant potential 08/22/2016   Reactive airway disease 08/11/2016   Benign paroxysmal positional vertigo 08/11/2016   Moderate asthma with acute exacerbation 11/05/2015   Bronchiolitis 08/14/2014   Medicare annual wellness visit, subsequent 07/24/2014   Adiposity 07/24/2014   Awareness of heartbeats 07/14/2014   Beat, premature ventricular 07/14/2014   Essential (primary) hypertension 06/16/2014   Mixed hyperlipidemia 06/16/2014   Midline cystocele 09/21/2013   Frank hematuria 09/06/2013   Renal mass, right 09/06/2013   Urge incontinence 09/06/2013    Allergies  Allergen Reactions   Codeine Sulfate Other (See Comments)   Ezetimibe Other (See Comments)    Roaring in ears   Fenofibrate Other (See Comments)    Roaring in ears   Fenofibric Acid Other (See Comments)    Roaring in ears   Gemfibrozil Other (See Comments)    Past Surgical History:  Procedure Laterality Date  CARPAL TUNNEL RELEASE Bilateral    CHOLECYSTECTOMY  11/05/2010   Dr. Pat Patrick   COLECTOMY  10/13/2007   Laparoscopic assisting ascending colectomy   COLON SURGERY  2009   remove a mass   COLONOSCOPY  03/2014   normal- cleared for 5 years- Dr Vira Agar   COLONOSCOPY WITH PROPOFOL N/A 10/12/2019   Procedure: COLONOSCOPY WITH PROPOFOL;  Surgeon: Toledo, Benay Pike, MD;  Location: [facility] ENDOSCOPY;   Service: Gastroenterology;  Laterality: N/A;   GALLBLADDER SURGERY     HERNIA REPAIR     JOINT REPLACEMENT     KNEE SURGERY Bilateral    menisacal repair and then replacement   LAPAROSCOPIC PARTIAL COLECTOMY     SKIN SURGERY     removal melanoma on arm and eye   TRIGGER FINGER RELEASE Left    VAGINAL HYSTERECTOMY  1984    Social History   Tobacco Use   Smoking status: Never   Smokeless tobacco: Never   Tobacco comments:    smoking cessation materials not required  Vaping Use   Vaping Use: Never used  Substance Use Topics   Alcohol use: No    Alcohol/week: 0.0 standard drinks   Drug use: No     Medication list has been reviewed and updated.  Current Meds  Medication Sig   albuterol (VENTOLIN HFA) 108 (90 Base) MCG/ACT inhaler Inhale 2 puffs into the lungs every 6 (six) hours as needed for wheezing or shortness of breath.   allopurinol (ZYLOPRIM) 100 MG tablet TAKE 1 TABLET(100 MG) BY MOUTH DAILY   ascorbic acid (VITAMIN C) 1000 MG tablet Take 1 tablet by mouth daily.   b complex vitamins capsule Take 1 capsule by mouth daily.   Calcium Carbonate-Vitamin D (CALTRATE 600+D PO) Take 1 tablet by mouth daily.   icosapent Ethyl (VASCEPA) 1 g capsule Take 2 capsules by mouth in the morning and at bedtime.   meclizine (ANTIVERT) 25 MG tablet Take 1 tablet (25 mg total) by mouth 3 (three) times daily as needed for dizziness.   meloxicam (MOBIC) 15 MG tablet TAKE 1 TABLET BY MOUTH DAILY   torsemide (DEMADEX) 10 MG tablet Take 10 mg by mouth as needed (twice a week as needed for ankle swelling). Dr Nehemiah Massed   triamterene-hydrochlorothiazide (MAXZIDE-25) 37.5-25 MG tablet Take 0.5 tablets by mouth as directed. 3 times qweek- Dr Nehemiah Massed   Zinc 50 MG TABS Take 1 tablet by mouth daily.   Current Facility-Administered Medications for the 08/02/20 encounter (Office Visit) with Juline Patch, MD  Medication   albuterol (PROVENTIL) (2.5 MG/3ML) 0.083% nebulizer solution 2.5 mg    PHQ  2/9 Scores 06/28/2020 05/21/2020 11/17/2019 10/17/2019  PHQ - 2 Score 0 0 0 0  PHQ- 9 Score 0 2 0 0    GAD 7 : Generalized Anxiety Score 06/28/2020 05/21/2020 11/17/2019 10/17/2019  Nervous, Anxious, on Edge 0 0 0 0  Control/stop worrying 0 0 0 0  Worry too much - different things 0 0 0 0  Trouble relaxing 0 0 0 0  Restless 0 0 0 0  Easily annoyed or irritable 0 0 0 0  Afraid - awful might happen 0 0 0 0  Total GAD 7 Score 0 0 0 0  Anxiety Difficulty Not difficult at all - - Not difficult at all    BP Readings from Last 3 Encounters:  08/02/20 122/78  07/26/20 110/70  06/28/20 130/68    Physical Exam Vitals and nursing note reviewed.  Constitutional:  General: She is not in acute distress.    Appearance: She is not diaphoretic.  HENT:     Head: Normocephalic and atraumatic.     Right Ear: Tympanic membrane, ear canal and external ear normal.     Left Ear: Tympanic membrane, ear canal and external ear normal.     Nose: Nose normal. No congestion or rhinorrhea.  Eyes:     General:        Right eye: No discharge.        Left eye: No discharge.     Conjunctiva/sclera: Conjunctivae normal.     Pupils: Pupils are equal, round, and reactive to light.  Neck:     Thyroid: No thyromegaly.     Vascular: No JVD.  Cardiovascular:     Rate and Rhythm: Normal rate and regular rhythm.     Heart sounds: Normal heart sounds. No murmur heard.   No friction rub. No gallop.  Pulmonary:     Effort: Pulmonary effort is normal. No respiratory distress.     Breath sounds: Normal breath sounds. No stridor. No wheezing or rhonchi.  Abdominal:     General: Bowel sounds are normal.     Palpations: Abdomen is soft. There is no mass.     Tenderness: There is no abdominal tenderness. There is no guarding.  Musculoskeletal:        General: Normal range of motion.     Cervical back: Normal range of motion and neck supple.  Lymphadenopathy:     Cervical: No cervical adenopathy.  Skin:     General: Skin is warm and dry.  Neurological:     Mental Status: She is alert.     Deep Tendon Reflexes: Reflexes are normal and symmetric.    Wt Readings from Last 3 Encounters:  08/02/20 171 lb (77.6 kg)  07/26/20 172 lb (78 kg)  06/28/20 171 lb (77.6 kg)    BP 122/78   Pulse 72   Ht 5\' 4"  (1.626 m)   Wt 171 lb (77.6 kg)   BMI 29.35 kg/m   Assessment and Plan: 1. Allergic sinusitis Chronic.  Episodic.  Stable.  Patient is on day 7 of azithromycin for suspected acute sinusitis and she has improved but it has not totally resolved.  We reviewed current over-the-counter preparations for her sinuses including nasal saline nasal steroid pseudoephedrine decongestant and Mucinex.  I have called in Singulair 10 mg to approach it from the leukotrienes aspect and if this continues her next step is probable x-ray of her sinuses and the possibility of having allergy testing done. - montelukast (SINGULAIR) 10 MG tablet; Take 1 tablet (10 mg total) by mouth at bedtime.  Dispense: 30 tablet; Refill: 3

## 2020-09-03 DIAGNOSIS — N2889 Other specified disorders of kidney and ureter: Secondary | ICD-10-CM | POA: Diagnosis not present

## 2020-09-03 DIAGNOSIS — N200 Calculus of kidney: Secondary | ICD-10-CM | POA: Diagnosis not present

## 2020-09-05 ENCOUNTER — Ambulatory Visit (INDEPENDENT_AMBULATORY_CARE_PROVIDER_SITE_OTHER): Payer: Medicare Other

## 2020-09-05 ENCOUNTER — Other Ambulatory Visit: Payer: Self-pay

## 2020-09-05 VITALS — BP 122/82 | HR 73 | Temp 98.4°F | Resp 16 | Ht 64.0 in | Wt 176.4 lb

## 2020-09-05 DIAGNOSIS — Z0000 Encounter for general adult medical examination without abnormal findings: Secondary | ICD-10-CM | POA: Diagnosis not present

## 2020-09-05 NOTE — Patient Instructions (Signed)
Kiara Becker , Thank you for taking time to come for your Medicare Wellness Visit. I appreciate your ongoing commitment to your health goals. Please review the following plan we discussed and let me know if I can assist you in the future.   Screening recommendations/referrals: Colonoscopy: done 10/12/19 Mammogram: done 11/23/19 Bone Density: done 08/25/16 Recommended yearly ophthalmology/optometry visit for glaucoma screening and checkup Recommended yearly dental visit for hygiene and checkup  Vaccinations: Influenza vaccine: done 11/17/19 Pneumococcal vaccine: done 07/25/15 Tdap vaccine: done 06/24/12 Shingles vaccine: done 11/09/19 & 01/11/20 Covid-19:done 03/16/19, 04/06/19 & 12/22/19  Advanced directives: Please bring a copy of your health care power of attorney and living will to the office at your convenience.   Conditions/risks identified: Recommend healthy eating and physical activity for desired weight loss  Next appointment: Follow up in one year for your annual wellness visit    Preventive Care 65 Years and Older, Female Preventive care refers to lifestyle choices and visits with your health care provider that can promote health and wellness. What does preventive care include? A yearly physical exam. This is also called an annual well check. Dental exams once or twice a year. Routine eye exams. Ask your health care provider how often you should have your eyes checked. Personal lifestyle choices, including: Daily care of your teeth and gums. Regular physical activity. Eating a healthy diet. Avoiding tobacco and drug use. Limiting alcohol use. Practicing safe sex. Taking low-dose aspirin every day. Taking vitamin and mineral supplements as recommended by your health care provider. What happens during an annual well check? The services and screenings done by your health care provider during your annual well check will depend on your age, overall health, lifestyle risk factors, and  family history of disease. Counseling  Your health care provider may ask you questions about your: Alcohol use. Tobacco use. Drug use. Emotional well-being. Home and relationship well-being. Sexual activity. Eating habits. History of falls. Memory and ability to understand (cognition). Work and work Statistician. Reproductive health. Screening  You may have the following tests or measurements: Height, weight, and BMI. Blood pressure. Lipid and cholesterol levels. These may be checked every 5 years, or more frequently if you are over 67 years old. Skin check. Lung cancer screening. You may have this screening every year starting at age 46 if you have a 30-pack-year history of smoking and currently smoke or have quit within the past 15 years. Fecal occult blood test (FOBT) of the stool. You may have this test every year starting at age 33. Flexible sigmoidoscopy or colonoscopy. You may have a sigmoidoscopy every 5 years or a colonoscopy every 10 years starting at age 84. Hepatitis C blood test. Hepatitis B blood test. Sexually transmitted disease (STD) testing. Diabetes screening. This is done by checking your blood sugar (glucose) after you have not eaten for a while (fasting). You may have this done every 1-3 years. Bone density scan. This is done to screen for osteoporosis. You may have this done starting at age 24. Mammogram. This may be done every 1-2 years. Talk to your health care provider about how often you should have regular mammograms. Talk with your health care provider about your test results, treatment options, and if necessary, the need for more tests. Vaccines  Your health care provider may recommend certain vaccines, such as: Influenza vaccine. This is recommended every year. Tetanus, diphtheria, and acellular pertussis (Tdap, Td) vaccine. You may need a Td booster every 10 years. Zoster vaccine. You may  need this after age 33. Pneumococcal 13-valent conjugate  (PCV13) vaccine. One dose is recommended after age 3. Pneumococcal polysaccharide (PPSV23) vaccine. One dose is recommended after age 65. Talk to your health care provider about which screenings and vaccines you need and how often you need them. This information is not intended to replace advice given to you by your health care provider. Make sure you discuss any questions you have with your health care provider. Document Released: 02/16/2015 Document Revised: 10/10/2015 Document Reviewed: 11/21/2014 Elsevier Interactive Patient Education  2017 [place] Prevention in the Home Falls can cause injuries. They can happen to people of all ages. There are many things you can do to make your home safe and to help prevent falls. What can I do on the outside of my home? Regularly fix the edges of walkways and driveways and fix any cracks. Remove anything that might make you trip as you walk through a door, such as a raised step or threshold. Trim any bushes or trees on the path to your home. Use bright outdoor lighting. Clear any walking paths of anything that might make someone trip, such as rocks or tools. Regularly check to see if handrails are loose or broken. Make sure that both sides of any steps have handrails. Any raised decks and porches should have guardrails on the edges. Have any leaves, snow, or ice cleared regularly. Use sand or salt on walking paths during winter. Clean up any spills in your garage right away. This includes oil or grease spills. What can I do in the bathroom? Use night lights. Install grab bars by the toilet and in the tub and shower. Do not use towel bars as grab bars. Use non-skid mats or decals in the tub or shower. If you need to sit down in the shower, use a plastic, non-slip stool. Keep the floor dry. Clean up any water that spills on the floor as soon as it happens. Remove soap buildup in the tub or shower regularly. Attach bath mats securely with  double-sided non-slip rug tape. Do not have throw rugs and other things on the floor that can make you trip. What can I do in the bedroom? Use night lights. Make sure that you have a light by your bed that is easy to reach. Do not use any sheets or blankets that are too big for your bed. They should not hang down onto the floor. Have a firm chair that has side arms. You can use this for support while you get dressed. Do not have throw rugs and other things on the floor that can make you trip. What can I do in the kitchen? Clean up any spills right away. Avoid walking on wet floors. Keep items that you use a lot in easy-to-reach places. If you need to reach something above you, use a strong step stool that has a grab bar. Keep electrical cords out of the way. Do not use floor polish or wax that makes floors slippery. If you must use wax, use non-skid floor wax. Do not have throw rugs and other things on the floor that can make you trip. What can I do with my stairs? Do not leave any items on the stairs. Make sure that there are handrails on both sides of the stairs and use them. Fix handrails that are broken or loose. Make sure that handrails are as long as the stairways. Check any carpeting to make sure that it is firmly attached  to the stairs. Fix any carpet that is loose or worn. Avoid having throw rugs at the top or bottom of the stairs. If you do have throw rugs, attach them to the floor with carpet tape. Make sure that you have a light switch at the top of the stairs and the bottom of the stairs. If you do not have them, ask someone to add them for you. What else can I do to help prevent falls? Wear shoes that: Do not have high heels. Have rubber bottoms. Are comfortable and fit you well. Are closed at the toe. Do not wear sandals. If you use a stepladder: Make sure that it is fully opened. Do not climb a closed stepladder. Make sure that both sides of the stepladder are locked  into place. Ask someone to hold it for you, if possible. Clearly mark and make sure that you can see: Any grab bars or handrails. First and last steps. Where the edge of each step is. Use tools that help you move around (mobility aids) if they are needed. These include: Canes. Walkers. Scooters. Crutches. Turn on the lights when you go into a dark area. Replace any light bulbs as soon as they burn out. Set up your furniture so you have a clear path. Avoid moving your furniture around. If any of your floors are uneven, fix them. If there are any pets around you, be aware of where they are. Review your medicines with your doctor. Some medicines can make you feel dizzy. This can increase your chance of falling. Ask your doctor what other things that you can do to help prevent falls. This information is not intended to replace advice given to you by your health care provider. Make sure you discuss any questions you have with your health care provider. Document Released: 11/16/2008 Document Revised: 06/28/2015 Document Reviewed: 02/24/2014 Elsevier Interactive Patient Education  2017 [employer]   Fatty Liver Disease  The liver converts food into energy, removes toxic material from the blood, makes important proteins, and absorbs necessary vitamins from food. Fatty liver disease occurs when too much fat has built up in your liver cells. Fatty liverdisease is also called hepatic steatosis. In many cases, fatty liver disease does not cause symptoms or problems. It is often diagnosed when tests are being done for other reasons. However, over time, fatty liver can cause inflammation that may lead to more serious liver problems, such as scarring of the liver (cirrhosis) and liver failure. Fatty liver is associated with insulin resistance, increased body fat, high blood pressure (hypertension), and high cholesterol. These are features of metabolic syndrome and increaseyour risk for stroke, diabetes,  and heart disease. What are the causes? This condition may be caused by components of metabolic syndrome: Obesity. Insulin resistance. High cholesterol. Other causes: Alcohol abuse. Poor nutrition. Cushing syndrome. Pregnancy. Certain drugs. Poisons. Some viral infections. What increases the risk? You are more likely to develop this condition if you: Abuse alcohol. Are overweight. Have diabetes. Have hepatitis. Have a high triglyceride level. Are pregnant. What are the signs or symptoms? Fatty liver disease often does not cause symptoms. If symptoms do develop, they can include: Fatigue and weakness. Weight loss. Confusion. Nausea, vomiting, or abdominal pain. Yellowing of your skin and the white parts of your eyes (jaundice). Itchy skin. How is this diagnosed? This condition may be diagnosed by: A physical exam and your medical history. Blood tests. Imaging tests, such as an ultrasound, CT scan, or MRI. A liver biopsy.  A small sample of liver tissue is removed using a needle. The sample is then looked at under a microscope. How is this treated? Fatty liver disease is often caused by other health conditions. Treatment for fatty liver may involve medicines and lifestyle changes to manage conditions such as: Alcoholism. High cholesterol. Diabetes. Being overweight or obese. Follow these instructions at home:  Do not drink alcohol. If you have trouble quitting, ask your health care provider how to safely quit with the help of medicine or a supervised program. This is important to keep your condition from getting worse. Eat a healthy diet as told by your health care provider. Ask your health care provider about working with a dietitian to develop an eating plan. Exercise regularly. This can help you lose weight and control your cholesterol and diabetes. Talk to your health care provider about an exercise plan and which activities are best for you. Take over-the-counter and  prescription medicines only as told by your health care provider. Keep all follow-up visits. This is important. Contact a health care provider if: You have trouble controlling your: Blood sugar. This is especially important if you have diabetes. Cholesterol. Drinking of alcohol. Get help right away if: You have abdominal pain. You have jaundice. You have nausea and are vomiting. You vomit blood or material that looks like coffee grounds. You have stools that are black, tar-like, or bloody. Summary Fatty liver disease develops when too much fat builds up in the cells of your liver. Fatty liver disease often causes no symptoms or problems. However, over time, fatty liver can cause inflammation that may lead to more serious liver problems, such as scarring of the liver (cirrhosis). You are more likely to develop this condition if you abuse alcohol, are pregnant, are overweight, have diabetes, have hepatitis, or have high triglyceride or cholesterol levels. Contact your health care provider if you have trouble controlling your blood sugar, cholesterol, or drinking of alcohol. This information is not intended to replace advice given to you by your health care provider. Make sure you discuss any questions you have with your healthcare provider. Document Revised: 11/03/2019 Document Reviewed: 11/03/2019 Elsevier Patient Education  [place].

## 2020-09-05 NOTE — Progress Notes (Signed)
Subjective:   Kiara Becker is a 75 y.o. female who presents for Medicare Annual (Subsequent) preventive examination.  Review of Systems     Cardiac Risk Factors include: advanced age (>55mn, >>90women);dyslipidemia;hypertension;obesity (BMI >30kg/m2)     Objective:    Today's Vitals   09/05/20 1129  BP: 122/82  Pulse: 73  Resp: 16  Temp: 98.4 F (36.9 C)  TempSrc: Oral  SpO2: 98%  Weight: 176 lb 6.4 oz (80 kg)  Height: '5\' 4"'$  (1.626 m)   Body mass index is 30.28 kg/m.  Advanced Directives 09/05/2020 10/12/2019 09/05/2019 08/30/2018 08/05/2017 08/04/2016 12/20/2015  Does Patient Have a Medical Advance Directive? Yes No No No No No No  Type of [place] - - - - - -  Copy of [place] Chart? No - copy requested - - - - - -  Would patient like information on creating a medical advance directive? - - No - Patient declined No - Patient declined Yes (MAU/Ambulatory/Procedural Areas - Information given) Yes (MAU/Ambulatory/Procedural Areas - Information given) -    Current Medications (verified) Outpatient Encounter Medications as of 09/05/2020  Medication Sig   albuterol (VENTOLIN HFA) 108 (90 Base) MCG/ACT inhaler Inhale 2 puffs into the lungs every 6 (six) hours as needed for wheezing or shortness of breath.   allopurinol (ZYLOPRIM) 100 MG tablet TAKE 1 TABLET(100 MG) BY MOUTH DAILY   ascorbic acid (VITAMIN C) 1000 MG tablet Take 1 tablet by mouth daily.   b complex vitamins capsule Take 1 capsule by mouth daily.   Calcium Carbonate-Vitamin D (CALTRATE 600+D PO) Take 1 tablet by mouth daily.   meclizine (ANTIVERT) 25 MG tablet Take 1 tablet (25 mg total) by mouth 3 (three) times daily as needed for dizziness.   meloxicam (MOBIC) 15 MG tablet TAKE 1 TABLET BY MOUTH DAILY (Patient taking differently: Take 15 mg by mouth daily. PRN only)   montelukast (SINGULAIR) 10 MG tablet Take 1 tablet (10 mg total) by mouth at  bedtime.   torsemide (DEMADEX) 10 MG tablet Take 10 mg by mouth as needed (twice a week as needed for ankle swelling). Dr KNehemiah Massed  triamterene-hydrochlorothiazide (MAXZIDE-25) 37.5-25 MG tablet Take 0.5 tablets by mouth as directed. 3 times qweek- Dr KNehemiah Massed  Zinc 50 MG TABS Take 1 tablet by mouth daily.   lovastatin (MEVACOR) 20 MG tablet Take by mouth.   [DISCONTINUED] icosapent Ethyl (VASCEPA) 1 g capsule Take 2 capsules by mouth in the morning and at bedtime.   Facility-Administered Encounter Medications as of 09/05/2020  Medication   albuterol (PROVENTIL) (2.5 MG/3ML) 0.083% nebulizer solution 2.5 mg    Allergies (verified) Codeine sulfate, Ezetimibe, Fenofibrate, Fenofibric acid, and Gemfibrozil   History: Past Medical History:  Diagnosis Date   Adenoma of colon    Allergy    Aortic atherosclerosis ([place]    Arthritis    Asthma    [place]   melanoma   [place]   Hyperlipidemia    Hypertension    Ventral hernia    Ventral hernia    Past Surgical History:  Procedure Laterality Date   CARPAL TUNNEL RELEASE Bilateral    CHOLECYSTECTOMY  11/05/2010   Dr. EPat Patrick  COLECTOMY  10/13/2007   Laparoscopic assisting ascending colectomy   COLON SURGERY  2009   remove a mass   COLONOSCOPY  03/2014   normal- cleared for 5 years- Dr EVira Agar  COLONOSCOPY WITH PROPOFOL N/A 10/12/2019   Procedure: COLONOSCOPY WITH PROPOFOL;  Surgeon: Toledo, Benay Pike, MD;  Location: [facility] ENDOSCOPY;  Service: Gastroenterology;  Laterality: N/A;   GALLBLADDER SURGERY     HERNIA REPAIR     JOINT REPLACEMENT     KNEE SURGERY Bilateral    menisacal repair and then replacement   LAPAROSCOPIC PARTIAL COLECTOMY     SKIN SURGERY     removal melanoma on arm and eye   TRIGGER FINGER RELEASE Left    VAGINAL HYSTERECTOMY  1984   Family History  Problem Relation Age of Onset   Heart disease Mother    Liver cancer Mother    Heart disease Father    Heart disease Brother    Drug  abuse Maternal Grandfather    Breast cancer Neg Hx    Social History   Socioeconomic History   Marital status: Married    Spouse name: Not on file   Number of children: 2   Years of education: some college   Highest education level: 12th grade  Occupational History   Occupation: Retired  Tobacco Use   Smoking status: Never   Smokeless tobacco: Never   Tobacco comments:    smoking cessation materials not required  Vaping Use   Vaping Use: Never used  Substance and Sexual Activity   Alcohol use: No    Alcohol/week: 0.0 standard drinks   Drug use: No   Sexual activity: Yes    Birth control/protection: None  Other Topics Concern   Not on file  Social History Narrative   Not on file   Social Determinants of Health   Financial Resource Strain: Low Risk    Difficulty of Paying Living Expenses: Not hard at all  Food Insecurity: No Food Insecurity   Worried About Charity fundraiser in the Last Year: Never true   Ran Out of Food in the Last Year: Never true  Transportation Needs: No Transportation Needs   Lack of Transportation (Medical): No   Lack of Transportation (Non-Medical): No  Physical Activity: Inactive   Days of Exercise per Week: 0 days   Minutes of Exercise per Session: 0 min  Stress: No Stress Concern Present   Feeling of Stress : Not at all  Social Connections: Moderately Integrated   Frequency of Communication with Friends and Family: More than three times a week   Frequency of Social Gatherings with Friends and Family: Three times a week   Attends Religious Services: More than 4 times per year   Active Member of Clubs or Organizations: No   Attends Archivist Meetings: Never   Marital Status: Married    Tobacco Counseling Counseling given: Not Answered Tobacco comments: smoking cessation materials not required   Clinical Intake:  Pre-visit preparation completed: Yes  Pain : No/denies pain     BMI - recorded: 30.28 Nutritional  Status: BMI > 30  Obese Nutritional Risks: None Diabetes: No  How often do you need to have someone help you when you read instructions, pamphlets, or other written materials from your doctor or pharmacy?: 1 - Never    Interpreter Needed?: No  Information entered by :: Clemetine Marker LPN   Activities of Daily Living In your present state of health, do you have any difficulty performing the following activities: 09/05/2020 06/28/2020  Hearing? Y N  Comment wears hearing aids -  Vision? N N  Difficulty concentrating or making decisions? N N  Walking or climbing stairs? N N  Dressing or bathing? N N  Doing errands, shopping? N N  Preparing Food and eating ? N -  Using the Toilet? N -  In the past six months, have you accidently leaked urine? Y -  Comment very little -  Do you have problems with loss of bowel control? N -  Managing your Medications? N -  Managing your Finances? N -  Housekeeping or managing your Housekeeping? N -  Some recent data might be hidden    Patient Care Team: Juline Patch, MD as PCP - General (Family Medicine) Jannet Mantis, MD (Dermatology) Corey Skains, MD as Consulting Physician (Cardiology) Raynor, Renaldo Harrison, MD as Referring Physician (Urology) Efrain Sella, MD as Consulting Physician (Gastroenterology)  Indicate any recent Medical Services you may have received from other than [facility] providers in the past year (date may be approximate).     Assessment:   This is a routine wellness examination for [place].  Hearing/Vision screen Hearing Screening - Comments:: Pt wears hearing aids maintained by [facility] [place] - Comments:: Annual vision screenings by Dr. Atilano Median  Dietary issues and exercise activities discussed: Current Exercise Habits: The patient does not participate in regular exercise at present, Exercise limited by: respiratory conditions(s)   Goals Addressed             This Visit's Progress     DIET - INCREASE WATER INTAKE   On track    Recommend to drink at least 6-8 8oz glasses of water per day.        Depression Screen PHQ 2/9 Scores 09/05/2020 06/28/2020 05/21/2020 11/17/2019 10/17/2019 09/05/2019 05/17/2019  PHQ - 2 Score 0 0 0 0 0 0 0  PHQ- 9 Score - 0 2 0 0 - 0    Fall Risk Fall Risk  09/05/2020 06/28/2020 11/17/2019 10/17/2019 09/05/2019  Falls in the past year? 0 0 0 0 0  Number falls in past yr: 0 0 - - 0  Injury with Fall? 0 0 - - 0  Risk for fall due to : No Fall Risks - - - No Fall Risks  Follow up Falls prevention discussed Falls evaluation completed - Falls evaluation completed Falls prevention discussed    FALL RISK PREVENTION PERTAINING TO THE HOME:  Any stairs in or around the home? Yes  If so, are there any without handrails? No  Home free of loose throw rugs in walkways, pet beds, electrical cords, etc? Yes  Adequate lighting in your home to reduce risk of falls? Yes   ASSISTIVE DEVICES UTILIZED TO PREVENT FALLS:  Life alert? No  Use of a cane, walker or w/c? No  Grab bars in the bathroom? No  Shower chair or bench in shower? Yes  Elevated toilet seat or a handicapped toilet? Yes   TIMED UP AND GO:  Was the test performed? Yes .  Length of time to ambulate 10 feet: 5 sec.   Gait steady and fast without use of assistive device  Cognitive Function: Normal cognitive status assessed by direct observation by this Nurse Health Advisor. No abnormalities found.       6CIT Screen 09/05/2019 08/30/2018 08/05/2017 08/04/2016  What Year? 0 points 0 points 0 points 0 points  What month? 0 points 0 points 0 points 0 points  What time? 0 points 0 points 0 points 0 points  Count back from 20 0 points 0 points 0 points 0 points  Months in reverse 0 points 0 points  0 points 0 points  Repeat phrase 0 points 0 points 0 points 2 points  Total Score 0 0 0 2    Immunizations Immunization History  Administered Date(s) Administered   Fluad Quad(high Dose 65+)  11/15/2018, 11/17/2019   Influenza, High Dose Seasonal PF 10/30/2017   Influenza,inj,Quad PF,6+ Mos 12/04/2014   Influenza-Unspecified 12/16/2016, 11/17/2019   PFIZER(Purple Top)SARS-COV-2 Vaccination 03/16/2019, 04/06/2019, 12/22/2019   Pneumococcal Conjugate-13 07/25/2015   Pneumococcal Polysaccharide-23 06/24/2012   Tdap 06/24/2012   Zoster Recombinat (Shingrix) 11/09/2019, 01/11/2020   Zoster, Live 06/04/2014    TDAP status: Up to date  Flu Vaccine status: Up to date  Pneumococcal vaccine status: Up to date  Covid-19 vaccine status: Completed vaccines  Qualifies for Shingles Vaccine? Yes   Zostavax completed Yes   Shingrix Completed?: Yes  Screening Tests Health Maintenance  Topic Date Due   COVID-19 Vaccine (4 - Booster for Pfizer series) 04/20/2020   INFLUENZA VACCINE  09/03/2020   MAMMOGRAM  11/22/2020   TETANUS/TDAP  06/25/2022   COLONOSCOPY (Pts 45-19yr Insurance coverage will need to be confirmed)  10/11/2024   DEXA SCAN  Completed   Hepatitis C Screening  Completed   PNA vac Low Risk Adult  Completed   Zoster Vaccines- Shingrix  Completed   HPV VACCINES  Aged Out    Health Maintenance  Health Maintenance Due  Topic Date Due   COVID-19 Vaccine (4 - Booster for [place]) 04/20/2020   INFLUENZA VACCINE  09/03/2020    Colorectal cancer screening: Type of screening: Colonoscopy. Completed 10/12/19. Repeat every 5 years  Mammogram status: Completed 11/23/19. Repeat every year  Bone Density status: Completed 08/25/16. Results reflect: Bone density results: NORMAL. Repeat every 2 years. Pt declines repeat screening at this time  Lung Cancer Screening: (Low Dose CT Chest recommended if Age 566-80years, 30 pack-year currently smoking OR have quit w/in 15years.) does not qualify.   Additional Screening:  Hepatitis C Screening: does qualify; Completed 08/11/16  Vision Screening: Recommended annual ophthalmology exams for early detection of glaucoma and other  disorders of the eye. Is the patient up to date with their annual eye exam?  Yes  Who is the provider or what is the name of the office in which the patient attends annual eye exams? Dr. CAtilano Median Dental Screening: Recommended annual dental exams for proper oral hygiene  Community Resource Referral / Chronic Care Management: CRR required this visit?  No   CCM required this visit?  No      Plan:     I have personally reviewed and noted the following in the patient's chart:   Medical and social history Use of alcohol, tobacco or illicit drugs  Current medications and supplements including opioid prescriptions.  Functional ability and status Nutritional status Physical activity Advanced directives List of other physicians Hospitalizations, surgeries, and ER visits in previous 12 months Vitals Screenings to include cognitive, depression, and falls Referrals and appointments  In addition, I have reviewed and discussed with patient certain preventive protocols, quality metrics, and best practice recommendations. A written personalized care plan for preventive services as well as general preventive health recommendations were provided to patient.     KClemetine Marker LPN   [id number]  Nurse Notes: none

## 2020-09-10 DIAGNOSIS — E782 Mixed hyperlipidemia: Secondary | ICD-10-CM | POA: Diagnosis not present

## 2020-09-11 DIAGNOSIS — K760 Fatty (change of) liver, not elsewhere classified: Secondary | ICD-10-CM | POA: Diagnosis not present

## 2020-09-11 DIAGNOSIS — E782 Mixed hyperlipidemia: Secondary | ICD-10-CM | POA: Diagnosis not present

## 2020-09-12 DIAGNOSIS — I700 Atherosclerosis of aorta: Secondary | ICD-10-CM | POA: Diagnosis not present

## 2020-09-12 DIAGNOSIS — I10 Essential (primary) hypertension: Secondary | ICD-10-CM | POA: Diagnosis not present

## 2020-09-12 DIAGNOSIS — E782 Mixed hyperlipidemia: Secondary | ICD-10-CM | POA: Diagnosis not present

## 2020-09-12 DIAGNOSIS — R600 Localized edema: Secondary | ICD-10-CM | POA: Diagnosis not present

## 2020-10-02 DIAGNOSIS — H2513 Age-related nuclear cataract, bilateral: Secondary | ICD-10-CM | POA: Diagnosis not present

## 2020-10-15 ENCOUNTER — Other Ambulatory Visit: Payer: Self-pay | Admitting: Family Medicine

## 2020-10-15 DIAGNOSIS — Z1231 Encounter for screening mammogram for malignant neoplasm of breast: Secondary | ICD-10-CM

## 2020-10-23 DIAGNOSIS — Z961 Presence of intraocular lens: Secondary | ICD-10-CM | POA: Diagnosis not present

## 2020-10-23 DIAGNOSIS — H2513 Age-related nuclear cataract, bilateral: Secondary | ICD-10-CM | POA: Diagnosis not present

## 2020-11-21 ENCOUNTER — Other Ambulatory Visit: Payer: Self-pay

## 2020-11-21 ENCOUNTER — Ambulatory Visit (INDEPENDENT_AMBULATORY_CARE_PROVIDER_SITE_OTHER): Payer: Medicare Other | Admitting: Family Medicine

## 2020-11-21 ENCOUNTER — Encounter: Payer: Self-pay | Admitting: Family Medicine

## 2020-11-21 VITALS — BP 130/60 | HR 80 | Ht 64.0 in | Wt 177.0 lb

## 2020-11-21 DIAGNOSIS — J309 Allergic rhinitis, unspecified: Secondary | ICD-10-CM | POA: Diagnosis not present

## 2020-11-21 DIAGNOSIS — H8309 Labyrinthitis, unspecified ear: Secondary | ICD-10-CM | POA: Diagnosis not present

## 2020-11-21 DIAGNOSIS — J4520 Mild intermittent asthma, uncomplicated: Secondary | ICD-10-CM

## 2020-11-21 DIAGNOSIS — E790 Hyperuricemia without signs of inflammatory arthritis and tophaceous disease: Secondary | ICD-10-CM | POA: Diagnosis not present

## 2020-11-21 DIAGNOSIS — M519 Unspecified thoracic, thoracolumbar and lumbosacral intervertebral disc disorder: Secondary | ICD-10-CM | POA: Diagnosis not present

## 2020-11-21 DIAGNOSIS — R7303 Prediabetes: Secondary | ICD-10-CM

## 2020-11-21 DIAGNOSIS — Z23 Encounter for immunization: Secondary | ICD-10-CM | POA: Diagnosis not present

## 2020-11-21 MED ORDER — MECLIZINE HCL 25 MG PO TABS: 25.0000 mg | ORAL_TABLET | Freq: Three times a day (TID) | ORAL | 5 refills | Status: AC | PRN

## 2020-11-21 MED ORDER — MELOXICAM 15 MG PO TABS: ORAL_TABLET | ORAL | 1 refills | Status: AC

## 2020-11-21 MED ORDER — ALBUTEROL SULFATE HFA 108 (90 BASE) MCG/ACT IN AERS: 2.0000 | INHALATION_SPRAY | Freq: Four times a day (QID) | RESPIRATORY_TRACT | 1 refills | Status: AC | PRN

## 2020-11-21 NOTE — Progress Notes (Signed)
Date:  11/21/2020   Name:  Kiara Becker   DOB:  [birth year]   MRN:  [id number]   Chief Complaint: Dizziness (Uses meclizine), Asthma (Uses inhaler), Neck Pain (Uses meloxicam), and Flu Vaccine  Dizziness This is a chronic problem. The current episode started more than 1 year ago. The problem has been waxing and waning. Associated symptoms include neck pain. Pertinent negatives include no abdominal pain, chest pain, chills, coughing, fatigue, fever, headaches, myalgias, nausea, rash or sore throat. Nothing aggravates the symptoms. The treatment provided moderate relief.  Asthma She complains of frequent throat clearing. There is no chest tightness, cough, shortness of breath or wheezing. The current episode started more than 1 year ago. The problem has been gradually improving. Pertinent negatives include no chest pain, ear pain, fever, headaches, myalgias or sore throat. She reports moderate improvement on treatment. Her past medical history is significant for asthma.  Neck Pain  This is a chronic problem. The problem has been gradually improving. The quality of the pain is described as aching. The pain is moderate. Pertinent negatives include no chest pain, fever or headaches. She has tried NSAIDs for the symptoms. The treatment provided moderate relief.   Lab Results  Component Value Date   CREATININE 0.88 11/17/2019   BUN 24 11/17/2019   NA 142 11/17/2019   K 3.6 11/17/2019   CL 95 (L) 11/17/2019   CO2 25 11/17/2019   Lab Results  Component Value Date   CHOL 175 08/17/2017   HDL 35 (L) 08/17/2017   LDLCALC 79 08/17/2017   TRIG 306 (H) 08/17/2017   CHOLHDL 5.0 (H) 08/17/2017   Lab Results  Component Value Date   TSH 1.820 10/17/2019   Lab Results  Component Value Date   HGBA1C 5.8 (H) 05/21/2020   Lab Results  Component Value Date   WBC 11.8 (H) 05/21/2020   HGB 14.4 05/21/2020   HCT 42.1 05/21/2020   MCV 84 05/21/2020   PLT 315 05/21/2020   No results found for:  ALT, AST, GGT, ALKPHOS, BILITOT   Review of Systems  Constitutional:  Negative for chills, fatigue and fever.  HENT:  Negative for drooling, ear discharge, ear pain and sore throat.   Respiratory:  Negative for cough, shortness of breath and wheezing.   Cardiovascular:  Negative for chest pain, palpitations and leg swelling.  Gastrointestinal:  Negative for abdominal pain, blood in stool, constipation, diarrhea and nausea.  Endocrine: Negative for polydipsia.  Genitourinary:  Negative for dysuria, frequency, hematuria and urgency.  Musculoskeletal:  Positive for neck pain. Negative for back pain and myalgias.  Skin:  Negative for rash.  Allergic/Immunologic: Negative for environmental allergies.  Neurological:  Positive for dizziness. Negative for headaches.  Hematological:  Does not bruise/bleed easily.  Psychiatric/Behavioral:  Negative for suicidal ideas. The patient is not nervous/anxious.    Patient Active Problem List   Diagnosis Date Noted   H/O total hysterectomy 05/21/2020   Stable angina pectoris ([place]) 05/19/2019   Varicose veins of bilateral lower extremities with other complications 93/71/6967   Aortic atherosclerosis ([place]) 09/26/2016   Bladder neoplasm of uncertain malignant potential 08/22/2016   Reactive airway disease 08/11/2016   Benign paroxysmal positional vertigo 08/11/2016   Moderate asthma with acute exacerbation 11/05/2015   Bronchiolitis 08/14/2014   Medicare annual wellness visit, subsequent 07/24/2014   Adiposity 07/24/2014   Awareness of heartbeats 07/14/2014   Beat, premature ventricular 07/14/2014   Essential (primary) hypertension 06/16/2014   Mixed hyperlipidemia 06/16/2014  Midline cystocele 09/21/2013   Frank hematuria 09/06/2013   Renal mass, right 09/06/2013   Urge incontinence 09/06/2013    Allergies  Allergen Reactions   Codeine Sulfate Other (See Comments)   Ezetimibe Other (See Comments)    Roaring in ears   Fenofibrate Other (See  Comments)    Roaring in ears   Fenofibric Acid Other (See Comments)    Roaring in ears   Gemfibrozil Other (See Comments)    Past Surgical History:  Procedure Laterality Date   CARPAL TUNNEL RELEASE Bilateral    CHOLECYSTECTOMY  11/05/2010   Dr. Pat Patrick   COLECTOMY  10/13/2007   Laparoscopic assisting ascending colectomy   COLON SURGERY  2009   remove a mass   COLONOSCOPY  03/2014   normal- cleared for 5 years- Dr Vira Agar   COLONOSCOPY WITH PROPOFOL N/A 10/12/2019   Procedure: COLONOSCOPY WITH PROPOFOL;  Surgeon: Toledo, Benay Pike, MD;  Location: [facility] ENDOSCOPY;  Service: Gastroenterology;  Laterality: N/A;   GALLBLADDER SURGERY     HERNIA REPAIR     JOINT REPLACEMENT     KNEE SURGERY Bilateral    menisacal repair and then replacement   LAPAROSCOPIC PARTIAL COLECTOMY     SKIN SURGERY     removal melanoma on arm and eye   TRIGGER FINGER RELEASE Left    VAGINAL HYSTERECTOMY  1984    Social History   Tobacco Use   Smoking status: Never   Smokeless tobacco: Never   Tobacco comments:    smoking cessation materials not required  Vaping Use   Vaping Use: Never used  Substance Use Topics   Alcohol use: No    Alcohol/week: 0.0 standard drinks   Drug use: No     Medication list has been reviewed and updated.  Current Meds  Medication Sig   albuterol (VENTOLIN HFA) 108 (90 Base) MCG/ACT inhaler Inhale 2 puffs into the lungs every 6 (six) hours as needed for wheezing or shortness of breath.   azelastine (ASTELIN) 0.1 % nasal spray Place into both nostrils 2 (two) times daily. Use in each nostril as directed   Calcium Carbonate-Vitamin D (CALTRATE 600+D PO) Take 1 tablet by mouth daily.   loratadine (CLARITIN) 10 MG tablet Take 10 mg by mouth daily. otc   meclizine (ANTIVERT) 25 MG tablet Take 1 tablet (25 mg total) by mouth 3 (three) times daily as needed for dizziness.   meloxicam (MOBIC) 15 MG tablet TAKE 1 TABLET BY MOUTH DAILY (Patient taking differently: Take 15 mg by mouth  daily. PRN only)   Multiple Vitamins-Minerals (CENTRUM SILVER PO) Take 1 tablet by mouth daily.   torsemide (DEMADEX) 10 MG tablet Take 10 mg by mouth as needed (twice a week as needed for ankle swelling). Dr Nehemiah Massed   triamterene-hydrochlorothiazide (MAXZIDE-25) 37.5-25 MG tablet Take 0.5 tablets by mouth as directed. 3 times qweek- Dr Nehemiah Massed   vitamin B-12 (CYANOCOBALAMIN) 1000 MCG tablet Take 1,000 mcg by mouth daily.   Current Facility-Administered Medications for the 11/21/20 encounter (Office Visit) with Juline Patch, MD  Medication   albuterol (PROVENTIL) (2.5 MG/3ML) 0.083% nebulizer solution 2.5 mg    PHQ 2/9 Scores 11/21/2020 09/05/2020 06/28/2020 05/21/2020  PHQ - 2 Score 0 0 0 0  PHQ- 9 Score 0 - 0 2    GAD 7 : Generalized Anxiety Score 11/21/2020 06/28/2020 05/21/2020 11/17/2019  Nervous, Anxious, on Edge 0 0 0 0  Control/stop worrying 0 0 0 0  Worry too much - different things 0  0 0 0  Trouble relaxing 0 0 0 0  Restless 0 0 0 0  Easily annoyed or irritable 0 0 0 0  Afraid - awful might happen 0 0 0 0  Total GAD 7 Score 0 0 0 0  Anxiety Difficulty - Not difficult at all - -    BP Readings from Last 3 Encounters:  11/21/20 130/60  09/05/20 122/82  08/02/20 122/78    Physical Exam Vitals and nursing note reviewed.  Constitutional:      General: She is not in acute distress.    Appearance: She is not diaphoretic.  HENT:     Head: Normocephalic and atraumatic.     Right Ear: Tympanic membrane, ear canal and external ear normal. There is no impacted cerumen.     Left Ear: Tympanic membrane, ear canal and external ear normal. There is no impacted cerumen.     Nose: Nose normal. No congestion or rhinorrhea.  Eyes:     General:        Right eye: No discharge.        Left eye: No discharge.     Conjunctiva/sclera: Conjunctivae normal.     Pupils: Pupils are equal, round, and reactive to light.  Neck:     Thyroid: No thyromegaly.     Vascular: No carotid bruit  or JVD.  Cardiovascular:     Rate and Rhythm: Normal rate and regular rhythm.     Pulses: Normal pulses.     Heart sounds: Normal heart sounds, S1 normal and S2 normal. No murmur heard. No systolic murmur is present.  No diastolic murmur is present.    No friction rub. No gallop. No S3 or S4 sounds.  Pulmonary:     Effort: Pulmonary effort is normal.     Breath sounds: Normal breath sounds. No wheezing or rhonchi.  Abdominal:     General: Bowel sounds are normal.     Palpations: Abdomen is soft. There is no mass.     Tenderness: There is no abdominal tenderness. There is no guarding.  Musculoskeletal:        General: Normal range of motion.     Cervical back: Normal range of motion and neck supple.  Lymphadenopathy:     Cervical: No cervical adenopathy.  Skin:    General: Skin is warm and dry.  Neurological:     Mental Status: She is alert.     Deep Tendon Reflexes: Reflexes are normal and symmetric.    Wt Readings from Last 3 Encounters:  11/21/20 177 lb (80.3 kg)  09/05/20 176 lb 6.4 oz (80 kg)  08/02/20 171 lb (77.6 kg)    BP 130/60   Pulse 80   Ht 5\' 4"  (1.626 m)   Wt 177 lb (80.3 kg)   BMI 30.38 kg/m   Assessment and Plan:  1. Reactive airway disease, mild intermittent, uncomplicated Chronic.  Controlled.  Intermittent and mild.  Patient is controlled on albuterol inhaler 2 puffs every 6 hours as needed for wheezing or shortness of breath. - albuterol (VENTOLIN HFA) 108 (90 Base) MCG/ACT inhaler; Inhale 2 puffs into the lungs every 6 (six) hours as needed for wheezing or shortness of breath.  Dispense: 18 g; Refill: 1  2. Hyperuricemia Chronic.  Controlled.  Stable.  We will continue to evaluate and cover with medication allopurinol 100 mg daily.  3. Allergic sinusitis Chronic.  Controlled.  Stable.  Currently controlled on Astelin nasal spray.  4. Labyrinthitis, unspecified laterality Chronic.  Controlled.  Stable.  Episodic occurrence and will cover with  meclizine 25 mg as needed every 8 hours. - meclizine (ANTIVERT) 25 MG tablet; Take 1 tablet (25 mg total) by mouth 3 (three) times daily as needed for dizziness.  Dispense: 30 tablet; Refill: 5  5. Thoracic disc disease Chronic.  Controlled.  Stable.  Currently being followed by orthopedics.  Continue meloxicam 15 mg daily. - meloxicam (MOBIC) 15 MG tablet; TAKE 1 TABLET BY MOUTH DAILY  Dispense: 90 tablet; Refill: 1  6. Need for immunization against influenza Discussed and administered. - Flu Vaccine QUAD High Dose(Fluad)  7. Prediabetes Chronic.  Stable.  Controlled.  We will continue to monitor with A1c. - HgB A1c

## 2020-11-22 ENCOUNTER — Encounter: Payer: Self-pay | Admitting: Family Medicine

## 2020-11-22 LAB — HEMOGLOBIN A1C
Est. average glucose Bld gHb Est-mCnc: 120 mg/dL
Hgb A1c MFr Bld: 5.8 % — ABNORMAL HIGH (ref 4.8–5.6)

## 2020-11-26 ENCOUNTER — Other Ambulatory Visit: Payer: Self-pay

## 2020-11-26 ENCOUNTER — Ambulatory Visit
Admission: RE | Admit: 2020-11-26 | Discharge: 2020-11-26 | Disposition: A | Discharge status: Home / Self Care | Payer: Medicare Other | Source: Ambulatory Visit | Attending: Family Medicine | Admitting: Family Medicine

## 2020-11-26 DIAGNOSIS — Z1231 Encounter for screening mammogram for malignant neoplasm of breast: Secondary | ICD-10-CM | POA: Insufficient documentation

## 2020-11-29 ENCOUNTER — Other Ambulatory Visit: Payer: Self-pay | Admitting: Family Medicine

## 2020-11-29 DIAGNOSIS — R928 Other abnormal and inconclusive findings on diagnostic imaging of breast: Secondary | ICD-10-CM

## 2020-11-29 DIAGNOSIS — N6489 Other specified disorders of breast: Secondary | ICD-10-CM

## 2020-12-10 ENCOUNTER — Ambulatory Visit
Admission: RE | Admit: 2020-12-10 | Discharge: 2020-12-10 | Disposition: A | Discharge status: Home / Self Care | Payer: Medicare Other | Source: Ambulatory Visit | Attending: Family Medicine | Admitting: Family Medicine

## 2020-12-10 ENCOUNTER — Other Ambulatory Visit: Payer: Self-pay

## 2020-12-10 DIAGNOSIS — Z86018 Personal history of other benign neoplasm: Secondary | ICD-10-CM | POA: Diagnosis not present

## 2020-12-10 DIAGNOSIS — R922 Inconclusive mammogram: Secondary | ICD-10-CM | POA: Diagnosis not present

## 2020-12-10 DIAGNOSIS — N6489 Other specified disorders of breast: Secondary | ICD-10-CM | POA: Insufficient documentation

## 2020-12-10 DIAGNOSIS — Z85820 Personal history of malignant melanoma of skin: Secondary | ICD-10-CM | POA: Diagnosis not present

## 2020-12-10 DIAGNOSIS — L578 Other skin changes due to chronic exposure to nonionizing radiation: Secondary | ICD-10-CM | POA: Diagnosis not present

## 2020-12-10 DIAGNOSIS — L570 Actinic keratosis: Secondary | ICD-10-CM | POA: Diagnosis not present

## 2020-12-10 DIAGNOSIS — R928 Other abnormal and inconclusive findings on diagnostic imaging of breast: Secondary | ICD-10-CM

## 2020-12-10 DIAGNOSIS — Z872 Personal history of diseases of the skin and subcutaneous tissue: Secondary | ICD-10-CM | POA: Diagnosis not present

## 2020-12-10 DIAGNOSIS — Z859 Personal history of malignant neoplasm, unspecified: Secondary | ICD-10-CM | POA: Diagnosis not present

## 2021-01-02 DIAGNOSIS — H25811 Combined forms of age-related cataract, right eye: Secondary | ICD-10-CM | POA: Diagnosis not present

## 2021-01-02 DIAGNOSIS — I10 Essential (primary) hypertension: Secondary | ICD-10-CM | POA: Diagnosis not present

## 2021-01-02 DIAGNOSIS — H52201 Unspecified astigmatism, right eye: Secondary | ICD-10-CM | POA: Diagnosis not present

## 2021-08-12 ENCOUNTER — Ambulatory Visit: Payer: Medicare Other | Admitting: Family Medicine

## 2021-09-04 ENCOUNTER — Telehealth: Payer: Self-pay | Admitting: Family Medicine

## 2021-09-04 NOTE — Telephone Encounter (Signed)
Copied from [place] [id number]. Topic: Appointment Scheduling - Scheduling Inquiry for Clinic >> Sep 04, 2021  1:58 PM Kiara Becker wrote: Pt would like to cancel [facility] on 09/09/2021. Pt would not like to reschedule.

## 2021-09-09 ENCOUNTER — Ambulatory Visit: Payer: Medicare Other

## 2021-10-28 ENCOUNTER — Other Ambulatory Visit: Payer: Self-pay | Admitting: Internal Medicine

## 2021-10-28 DIAGNOSIS — Z1231 Encounter for screening mammogram for malignant neoplasm of breast: Secondary | ICD-10-CM

## 2021-11-27 ENCOUNTER — Ambulatory Visit
Admission: RE | Admit: 2021-11-27 | Discharge: 2021-11-27 | Disposition: A | Discharge status: Home / Self Care | Payer: Medicare Other | Source: Ambulatory Visit | Attending: Internal Medicine | Admitting: Internal Medicine

## 2021-11-27 DIAGNOSIS — Z1231 Encounter for screening mammogram for malignant neoplasm of breast: Secondary | ICD-10-CM | POA: Insufficient documentation

## 2021-12-02 ENCOUNTER — Encounter (INDEPENDENT_AMBULATORY_CARE_PROVIDER_SITE_OTHER): Payer: Self-pay

## 2022-03-10 NOTE — Progress Notes (Unsigned)
Cardiology Office Note  Date:  03/11/2022   ID:  WEDNESDAY Kiara Becker, DOB July 24, 1945, MRN [id number]  PCP:  Gladstone Lighter, MD   Chief Complaint  Patient presents with   New Patient (Initial Visit)    Patient c/o shortness of breath with walking. Medications reviewed by the patient verbally.     HPI:  Ms. Kiara Becker is a 77 year old woman with past medical history of PAD, carotid atherosclerosis and aortic atherosclerosis  HTN Hyperlipidemia Partial colectomy Asthma Aortic atherosclerosis Presents by referral from Dr. Tressia Miners for hyperlipidemia, HTN, PVCs  On discussion today, reports history of asthma Has inhaler but does not use it She has appreciated SOB on walking up hills Does okay on flat surfaces and stairs Describes it as a fullness in her upper chest  No significant smoking history  CT ABD pelvis 2016 Images pulled up and reviewed, mild aortic atherosclerosis  Echo 5/21 NORMAL LEFT VENTRICULAR SYSTOLIC FUNCTION  NORMAL RIGHT VENTRICULAR SYSTOLIC FUNCTION  NO VALVULAR STENOSIS  MILD TR, PR  TRIVIAL MR  EF >55%   EKG personally reviewed by myself on todays visit Normal sinus rhythm rate 70 bpm no significant ST-T wave changes   PMH:   has a past medical history of Adenoma of colon, Allergy, Aortic atherosclerosis ([place]), Arthritis, Asthma, Cancer ([place]) (2001), Colon cancer ([place]) (2009), Hyperlipidemia, Hypertension, Ventral hernia, and Ventral hernia.  PSH:    Past Surgical History:  Procedure Laterality Date   CARPAL TUNNEL RELEASE Bilateral    CHOLECYSTECTOMY  11/05/2010   Dr. Pat Patrick   COLECTOMY  10/13/2007   Laparoscopic assisting ascending colectomy   COLON SURGERY  2009   remove a mass   COLONOSCOPY  03/2014   normal- cleared for 5 years- Dr Vira Agar   COLONOSCOPY WITH PROPOFOL N/A 10/12/2019   Procedure: COLONOSCOPY WITH PROPOFOL;  Surgeon: Toledo, Benay Pike, MD;  Location: [facility] ENDOSCOPY;  Service: Gastroenterology;  Laterality: N/A;   GALLBLADDER  SURGERY     HERNIA REPAIR     JOINT REPLACEMENT     KNEE SURGERY Bilateral    menisacal repair and then replacement   LAPAROSCOPIC PARTIAL COLECTOMY     SKIN SURGERY     removal melanoma on arm and eye   TRIGGER FINGER RELEASE Left    VAGINAL HYSTERECTOMY  1984    Current Outpatient Medications  Medication Sig Dispense Refill   albuterol (VENTOLIN HFA) 108 (90 Base) MCG/ACT inhaler Inhale 2 puffs into the lungs every 6 (six) hours as needed for wheezing or shortness of breath. 18 g 1   allopurinol (ZYLOPRIM) 100 MG tablet TAKE 1 TABLET(100 MG) BY MOUTH DAILY 90 tablet 1   aspirin EC 81 MG tablet Take 81 mg by mouth daily. Swallow whole.     azelastine (ASTELIN) 0.1 % nasal spray Place into both nostrils 2 (two) times daily. Use in each nostril as directed     b complex vitamins capsule Take 1 capsule by mouth daily.     Calcium Carbonate-Vitamin D (CALTRATE 600+D PO) Take 1 tablet by mouth daily.     loratadine (CLARITIN) 10 MG tablet Take 10 mg by mouth daily. otc     lovastatin (MEVACOR) 20 MG tablet Take by mouth.     meclizine (ANTIVERT) 25 MG tablet Take 1 tablet (25 mg total) by mouth 3 (three) times daily as needed for dizziness. 30 tablet 5   meloxicam (MOBIC) 15 MG tablet TAKE 1 TABLET BY MOUTH DAILY 90 tablet 1   metoprolol tartrate (LOPRESSOR)  100 MG tablet Take 1 tablet 2 hours prior to CT 1 tablet 0   Multiple Vitamins-Minerals (CENTRUM SILVER PO) Take 1 tablet by mouth daily.     torsemide (DEMADEX) 10 MG tablet Take 10 mg by mouth as needed (twice a week as needed for ankle swelling). Dr Nehemiah Massed     triamterene-hydrochlorothiazide (MAXZIDE-25) 37.5-25 MG tablet Take 1 tablet by mouth daily.     TURMERIC CURCUMIN PO Take 1,000 mg by mouth daily.     vitamin B-12 (CYANOCOBALAMIN) 1000 MCG tablet Take 1,000 mcg by mouth daily.     Zinc 30 MG CAPS Take 30 mg by mouth daily.     Current Facility-Administered Medications  Medication Dose Route Frequency Provider Last Rate  Last Admin   albuterol (PROVENTIL) (2.5 MG/3ML) 0.083% nebulizer solution 2.5 mg  2.5 mg Nebulization Once Juline Patch, MD         Allergies:   Codeine sulfate, Ezetimibe, Fenofibrate, Fenofibric acid, and Gemfibrozil   Social History:  The patient  reports that she has never smoked. She has never used smokeless tobacco. She reports that she does not drink alcohol and does not use drugs.   Family History:   family history includes Arrhythmia in her brother; Drug abuse in her maternal grandfather; Heart disease in her brother, father, and mother; Liver cancer in her mother.    Review of Systems: Review of Systems  Constitutional: Negative.   HENT: Negative.    Respiratory: Negative.    Cardiovascular:  Positive for chest pain.  Gastrointestinal: Negative.   Musculoskeletal: Negative.   Neurological: Negative.   Psychiatric/Behavioral: Negative.    All other systems reviewed and are negative.    PHYSICAL EXAM: VS:  BP (!) 140/72 (BP Location: Right Arm, Patient Position: Sitting, Cuff Size: Normal)   Pulse 70   Ht '5\' 4"'$  (1.626 m)   Wt 173 lb 8 oz (78.7 kg)   SpO2 98%   BMI 29.78 kg/m  , BMI Body mass index is 29.78 kg/m. GEN: Well nourished, well developed, in no acute distress HEENT: normal Neck: no JVD, carotid bruits, or masses Cardiac: RRR; no murmurs, rubs, or gallops,no edema  Respiratory:  clear to auscultation bilaterally, normal work of breathing GI: soft, nontender, nondistended, + BS MS: no deformity or atrophy Skin: warm and dry, no rash Neuro:  Strength and sensation are intact Psych: euthymic mood, full affect   Recent Labs: No results found for requested labs within last 365 days.    Lipid Panel Lab Results  Component Value Date   CHOL 175 08/17/2017   HDL 35 (L) 08/17/2017   LDLCALC 79 08/17/2017   TRIG 306 (H) 08/17/2017      Wt Readings from Last 3 Encounters:  03/11/22 173 lb 8 oz (78.7 kg)  11/21/20 177 lb (80.3 kg)  09/05/20 176  lb 6.4 oz (80 kg)       ASSESSMENT AND PLAN:  Problem List Items Addressed This Visit       Cardiology Problems   Aortic atherosclerosis (HCC) - Primary   Relevant Medications   aspirin EC 81 MG tablet   metoprolol tartrate (LOPRESSOR) 100 MG tablet   Other Relevant Orders   EKG 12-Lead   Mixed hyperlipidemia   Relevant Medications   aspirin EC 81 MG tablet   metoprolol tartrate (LOPRESSOR) 100 MG tablet   Essential (primary) hypertension   Relevant Medications   aspirin EC 81 MG tablet   metoprolol tartrate (LOPRESSOR) 100 MG tablet  Other Relevant Orders   EKG 12-Lead   Beat, premature ventricular   Relevant Medications   aspirin EC 81 MG tablet   metoprolol tartrate (LOPRESSOR) 100 MG tablet     Other   Reactive airway disease   Other Visit Diagnoses     Chest pain, unspecified type       Relevant Orders   CT CORONARY MORPH W/CTA COR W/SCORE W/CA W/CM &/OR WO/CM      Chest tightness/angina Reports having chest tightness and shortness of breath on climbing hills Notable symptoms limiting ability to exercise Risk factors for coronary disease including hyperlipidemia Discussed various treatment options for evaluation We have recommended cardiac CTA as she would be unable to do treadmill Prior echocardiogram 2021 unrevealing though could be repeated if needed  Hyperlipidemia Lab work done through primary care Would continue statin, Calorie restriction, walking program  Essential hypertension Blood pressure is well controlled on today's visit. No changes made to the medications.  PVCs On metoprolol    Total encounter time more than 60 minutes  Greater than 50% was spent in counseling and coordination of care with the patient    Signed, Esmond Plants, M.D., Ph.D. [place], [place]

## 2022-03-11 ENCOUNTER — Encounter: Payer: Self-pay | Admitting: Cardiovascular Disease

## 2022-03-11 ENCOUNTER — Ambulatory Visit: Payer: Medicare Other | Attending: Cardiovascular Disease | Admitting: Cardiovascular Disease

## 2022-03-11 VITALS — BP 140/72 | HR 70 | Ht 64.0 in | Wt 173.5 lb

## 2022-03-11 DIAGNOSIS — R079 Chest pain, unspecified: Secondary | ICD-10-CM

## 2022-03-11 DIAGNOSIS — J4520 Mild intermittent asthma, uncomplicated: Secondary | ICD-10-CM

## 2022-03-11 DIAGNOSIS — I700 Atherosclerosis of aorta: Secondary | ICD-10-CM

## 2022-03-11 DIAGNOSIS — I10 Essential (primary) hypertension: Secondary | ICD-10-CM | POA: Diagnosis not present

## 2022-03-11 DIAGNOSIS — E782 Mixed hyperlipidemia: Secondary | ICD-10-CM

## 2022-03-11 DIAGNOSIS — I493 Ventricular premature depolarization: Secondary | ICD-10-CM

## 2022-03-11 MED ORDER — METOPROLOL TARTRATE 100 MG PO TABS: ORAL_TABLET | ORAL | 0 refills | Status: AC

## 2022-03-11 NOTE — Patient Instructions (Addendum)
Cardiac CTA for angina, CAD   Medication Instructions:  No changes  If you need a refill on your cardiac medications before your next appointment, please call your pharmacy.   Lab work: No new labs needed  Testing/Procedures: No new testing needed  Follow-Up: At [facility], you and your health needs are our priority.  As part of our continuing mission to provide you with exceptional heart care, we have created designated Provider Care Teams.  These Care Teams include your primary Cardiologist (physician) and Advanced Practice Providers (APPs -  Physician Assistants and Nurse Practitioners) who all work together to provide you with the care you need, when you need it.  You will need a follow up appointment as needed  Providers on your designated Care Team:   Murray Hodgkins, NP Christell Faith, PA-C Cadence Kathlen Mody, [place]  COVID-19 Vaccine Information can be found at: ShippingScam.co.uk For questions related to vaccine distribution or appointments, please email [email or web address] or call [phone].     Your cardiac CT will be scheduled at one of the below locations:   [facility] [address] [place], [place] [address] (336) [place] [address] [place], [place] [address] [phone]  [facility] [place], [place] [address] [phone]  If scheduled at [facility], please arrive at the [facility] and Children's Entrance (Entrance C2) of [facility] 30 minutes prior to test start time. You can use the FREE valet parking offered at entrance C (encouraged to control the heart rate for the test)  Proceed to the [facility] Radiology Department (first floor) to check-in and test prep.  All radiology patients and guests should use entrance C2 at [facility], accessed  from [facility], even though the hospital's physical address listed is [address]    If scheduled at [facility] or [facility], please arrive 15 mins early for check-in and test prep.   Please follow these instructions carefully (unless otherwise directed):    On the Night Before the Test: Be sure to Drink plenty of water. Do not consume any caffeinated/decaffeinated beverages or chocolate 12 hours prior to your test. Do not take any antihistamines 12 hours prior to your test.  On the Day of the Test: Drink plenty of water until 1 hour prior to the test. Do not eat any food 1 hour prior to test. You may take your regular medications prior to the test.  Take metoprolol (Lopressor) two hours prior to test. If you take torsemide (DEMADEX) 10 MG tablet, please HOLD on the morning of the test. FEMALES- please wear underwire-free bra if available, avoid dresses & tight clothing         After the Test: Drink plenty of water. After receiving IV contrast, you may experience a mild flushed feeling. This is normal. On occasion, you may experience a mild rash up to 24 hours after the test. This is not dangerous. If this occurs, you can take Benadryl 25 mg and increase your fluid intake. If you experience trouble breathing, this can be serious. If it is severe call 911 IMMEDIATELY. If it is mild, please call our office. If you take any of these medications: Glipizide/Metformin, Avandament, Glucavance, please do not take 48 hours after completing test unless otherwise instructed.  We will call to schedule your test 2-4 weeks out understanding that some insurance  companies will need an authorization prior to the service being performed.   For non-scheduling related questions, please contact the cardiac imaging nurse navigator should you have any questions/concerns: Marchia Bond, Cardiac Imaging Nurse Navigator Gordy Clement, Cardiac Imaging Nurse Navigator [facility] Direct Office Dial: [phone]   For scheduling needs, including cancellations and rescheduling, please call [place], [phone].

## 2022-04-01 ENCOUNTER — Telehealth (HOSPITAL_COMMUNITY): Payer: Self-pay | Admitting: *Deleted

## 2022-04-01 NOTE — Telephone Encounter (Signed)
Reaching out to patient to offer assistance regarding upcoming cardiac imaging study; pt verbalizes understanding of appt date/time, parking situation and where to check in, pre-test NPO status and medications ordered, and verified current allergies; name and call back number provided for further questions should they arise ? ?Mishel Sans RN Navigator Cardiac Imaging ?[facility] ?[phone] office ?[phone] cell ? ?Patient to take 100mg metoprolol tartrate two hours prior to her cardiac CT scan.  ?

## 2022-04-03 ENCOUNTER — Ambulatory Visit
Admission: RE | Admit: 2022-04-03 | Discharge: 2022-04-03 | Disposition: A | Discharge status: Home / Self Care | Payer: Medicare Other | Source: Ambulatory Visit | Attending: Cardiovascular Disease | Admitting: Cardiovascular Disease

## 2022-04-03 DIAGNOSIS — I700 Atherosclerosis of aorta: Secondary | ICD-10-CM | POA: Diagnosis not present

## 2022-04-03 DIAGNOSIS — K760 Fatty (change of) liver, not elsewhere classified: Secondary | ICD-10-CM | POA: Diagnosis not present

## 2022-04-03 DIAGNOSIS — R079 Chest pain, unspecified: Secondary | ICD-10-CM | POA: Insufficient documentation

## 2022-04-03 MED ORDER — NITROGLYCERIN 0.4 MG SL SUBL
0.8000 mg | SUBLINGUAL_TABLET | Freq: Once | SUBLINGUAL | Status: AC
  Administered 2022-04-03: 0.8 mg via SUBLINGUAL

## 2022-04-03 MED ORDER — IOHEXOL 350 MG/ML SOLN
75.0000 mL | Freq: Once | INTRAVENOUS | Status: AC | PRN
  Administered 2022-04-03: 75 mL via INTRAVENOUS

## 2022-04-03 NOTE — Progress Notes (Signed)
Patient tolerated CT well. Drank water after. Vital signs stable encourage to drink water throughout day.Reasons explained and verbalized understanding. Ambulated steady gait.   

## 2022-04-08 ENCOUNTER — Encounter: Payer: Self-pay | Admitting: Cardiovascular Disease

## 2022-04-30 IMAGING — MG MM DIGITAL SCREENING BILAT W/ TOMO AND CAD
8 series · 8 of 24 positions shown · non-contrast
Comparison: Previous exam(s).

CLINICAL DATA: Screening.

EXAM:
DIGITAL SCREENING BILATERAL MAMMOGRAM WITH TOMOSYNTHESIS AND CAD
TECHNIQUE: Bilateral screening digital craniocaudal and mediolateral oblique
mammograms were obtained. Bilateral screening digital breast
tomosynthesis was performed. The images were evaluated with
computer-aided detection.

[L MLO synth-2D]
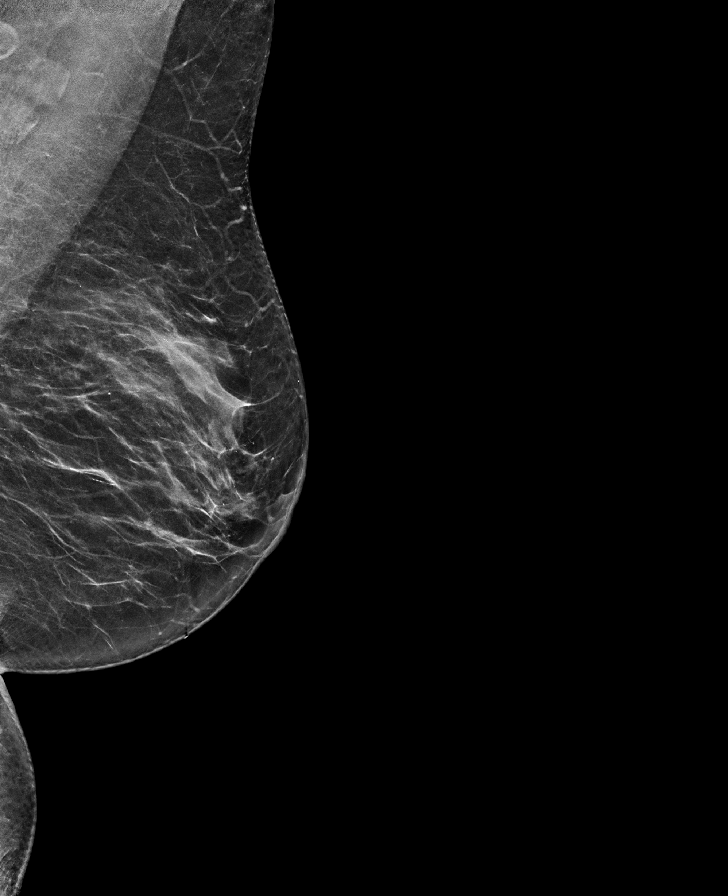

[R MLO synth-2D]
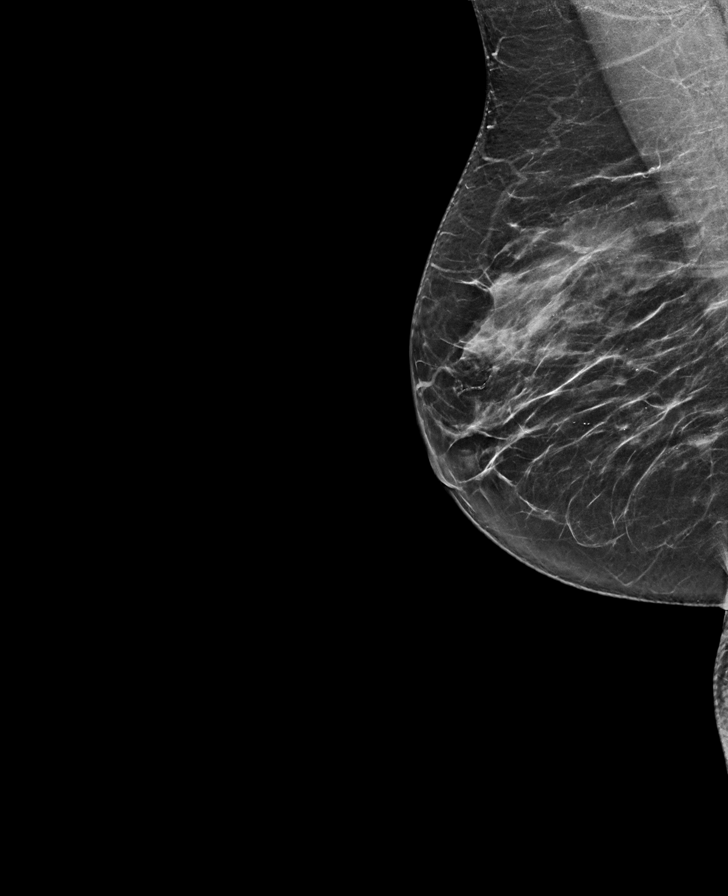

[R CC synth-2D]
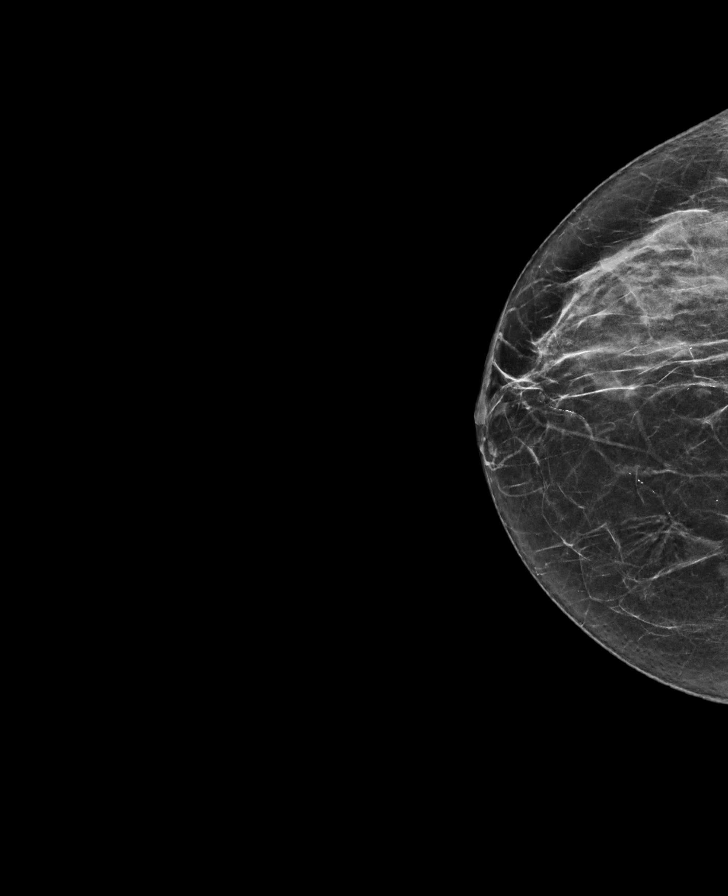

[L CC synth-2D]
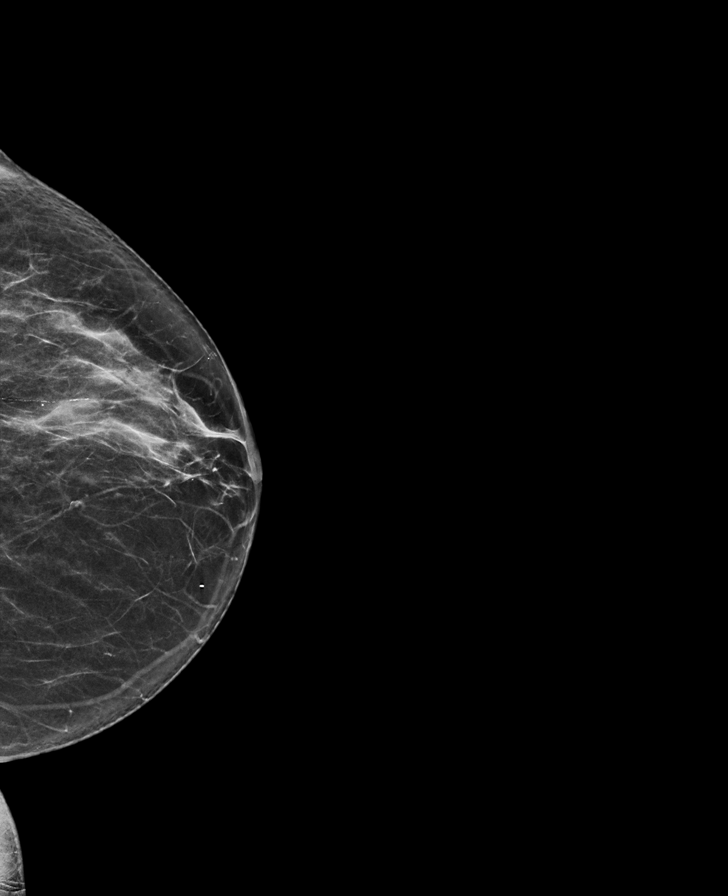

[R MLO tomo · tomo slice 33/65.0]
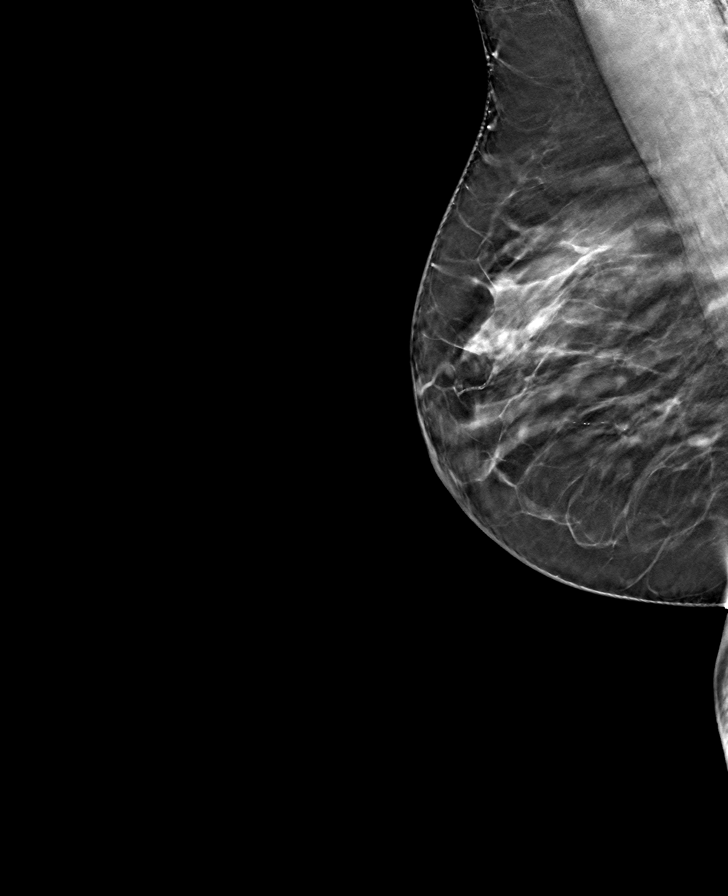

[L CC tomo · tomo slice 31/61.0]
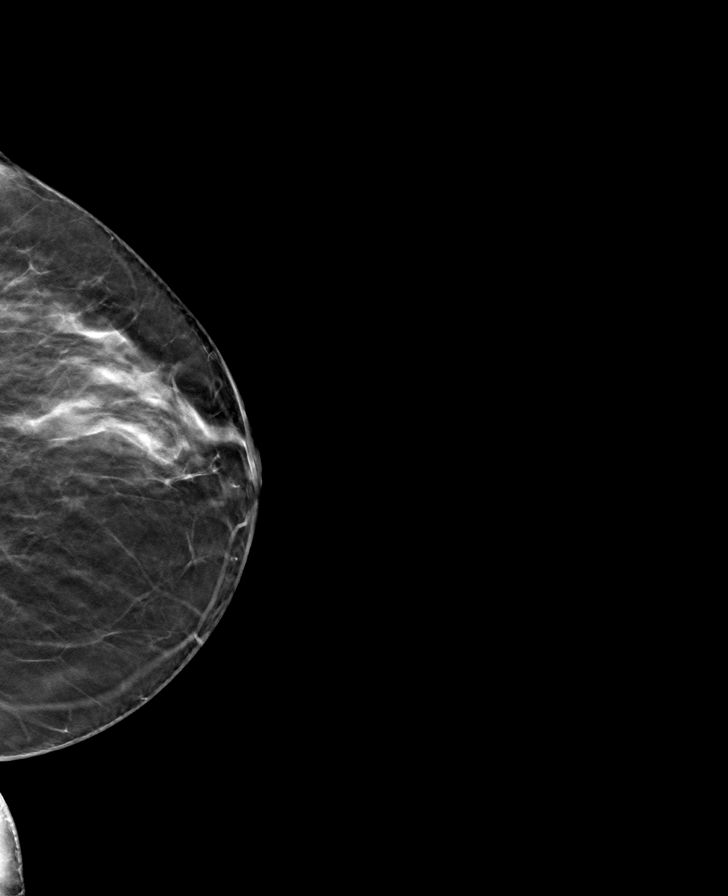

[R CC tomo · tomo slice 29/58.0]
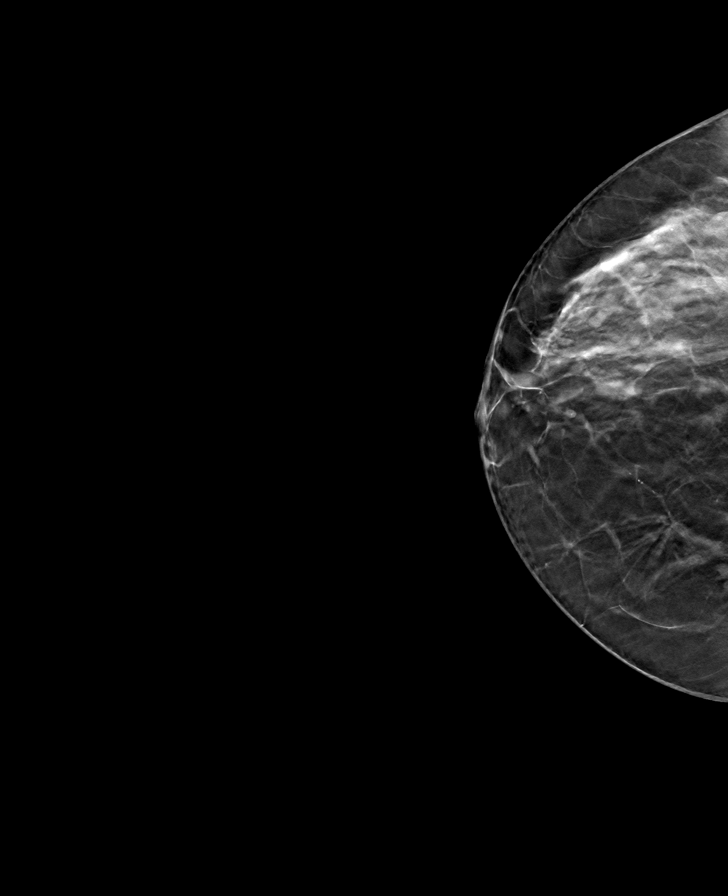

[L MLO tomo · tomo slice 33/65.0]
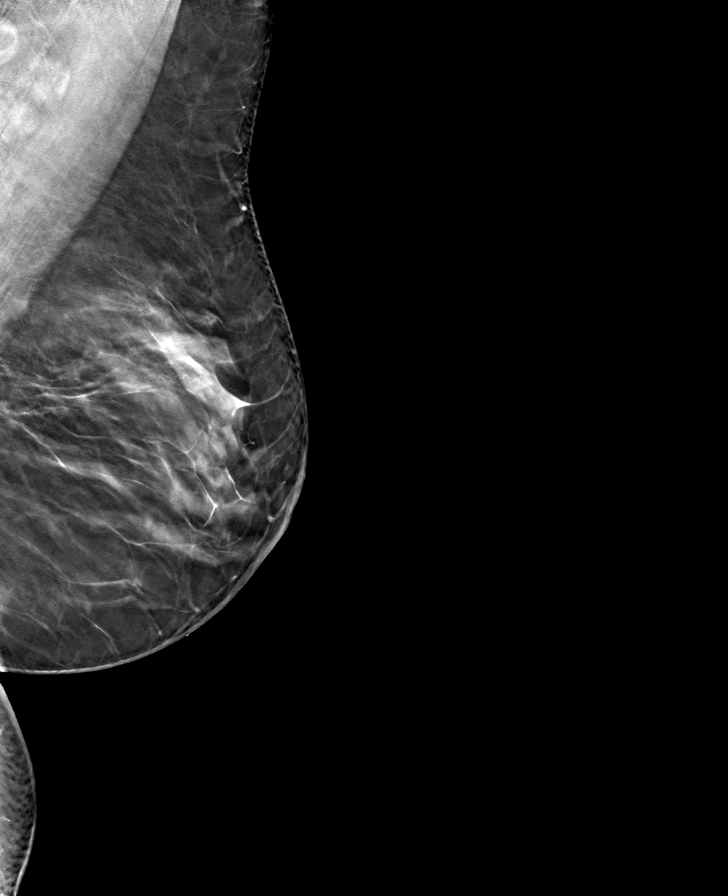

[8 of 24 positions shown; findings below may reference images not displayed]

ACR Breast Density Category b: There are scattered areas of
fibroglandular density.
FINDINGS: In the right breast, a possible asymmetry warrants further
evaluation. In the left breast, no findings suspicious for
malignancy.
IMPRESSION: Further evaluation is suggested for possible asymmetry in the right
breast.

RECOMMENDATION:
Diagnostic mammogram and possibly ultrasound of the right breast.
(Code:[id number])

The patient will be contacted regarding the findings, and additional
imaging will be scheduled.

BI-RADS CATEGORY  0: Incomplete. Need additional imaging evaluation
and/or prior mammograms for comparison.

## 2022-06-13 ENCOUNTER — Ambulatory Visit
Admission: RE | Admit: 2022-06-13 | Discharge: 2022-06-13 | Disposition: A | Discharge status: Home / Self Care | Payer: Medicare Other | Attending: Internal Medicine | Admitting: Internal Medicine

## 2022-06-13 ENCOUNTER — Ambulatory Visit
Admission: RE | Admit: 2022-06-13 | Discharge: 2022-06-13 | Disposition: A | Discharge status: Home / Self Care | Payer: Medicare Other | Source: Ambulatory Visit | Attending: Internal Medicine | Admitting: Internal Medicine

## 2022-06-13 ENCOUNTER — Other Ambulatory Visit: Payer: Self-pay | Admitting: Internal Medicine

## 2022-06-13 DIAGNOSIS — R1032 Left lower quadrant pain: Secondary | ICD-10-CM

## 2022-06-13 MED ORDER — IOHEXOL 9 MG/ML PO SOLN
500.0000 mL | ORAL | Status: AC
  Administered 2022-06-13 (×2): 500 mL via ORAL

## 2022-06-13 MED ORDER — IOHEXOL 300 MG/ML  SOLN
100.0000 mL | Freq: Once | INTRAMUSCULAR | Status: AC | PRN
  Administered 2022-06-13: 100 mL via INTRAVENOUS

## 2022-11-04 ENCOUNTER — Other Ambulatory Visit: Payer: Self-pay | Admitting: Internal Medicine

## 2022-11-04 DIAGNOSIS — Z1231 Encounter for screening mammogram for malignant neoplasm of breast: Secondary | ICD-10-CM

## 2022-12-01 ENCOUNTER — Ambulatory Visit
Admission: RE | Admit: 2022-12-01 | Discharge: 2022-12-01 | Disposition: A | Discharge status: Home / Self Care | Payer: Medicare Other | Source: Ambulatory Visit | Attending: Internal Medicine | Admitting: Internal Medicine

## 2022-12-01 DIAGNOSIS — Z1231 Encounter for screening mammogram for malignant neoplasm of breast: Secondary | ICD-10-CM | POA: Insufficient documentation

## 2023-10-11 NOTE — Progress Notes (Unsigned)
 Cardiology Office Note  Date:  10/12/2023   ID:  NAKETA DADDARIO, DOB 11-01-1945, MRN [id number]  PCP:  Sherial Bail, MD   Chief Complaint  Patient presents with   Follow-up    Doing well.     HPI:  Ms. Kiara Becker is a 78 year old woman with past medical history of PAD, carotid atherosclerosis and aortic atherosclerosis  HTN Hyperlipidemia Partial colectomy Asthma 2/24: calcium score 9, no significant coronary stenosis Presents for routine follow-up of her PAD/aortic atherosclerosis, hyperlipidemia, HTN, PVCs  LOV 2/24 On prior office visit reported having chest pain climbing hills Cardiac CTA performed showing calcium score 9.3, no significant coronary stenosis noted Aortic atherosclerosis noted Fatty liver  Lovastatin not on her medication list Not for a long time  House work, stays busy No regular exercise program  Continues to use inhaler as needed for asthma  No significant smoking history  Lab work reviewed Total cholesterol 149 LDL 68 A1c 5.8 Normal BMP, CBC  CT ABD pelvis 2016 mild aortic atherosclerosis  Echo 5/21 NORMAL LEFT VENTRICULAR SYSTOLIC FUNCTION  NORMAL RIGHT VENTRICULAR SYSTOLIC FUNCTION  NO VALVULAR STENOSIS  MILD TR, PR  TRIVIAL MR  EF >55%   EKG personally reviewed by myself on todays visit EKG Interpretation Date/Time:  Monday October 12 2023 08:11:54 EDT Ventricular Rate:  59 PR Interval:  168 QRS Duration:  82 QT Interval:  422 QTC Calculation: 417 R Axis:   6  Text Interpretation: Sinus bradycardia When compared with ECG of 12-Aug-2011 10:09, No significant change was found Confirmed by Perla Lye [id number]) on 10/12/2023 8:20:52 AM    PMH:   has a past medical history of Adenoma of colon, Allergy, Aortic atherosclerosis (HCC), Arthritis, Asthma, Cancer (HCC) (2001), Colon cancer (HCC) (2009), Hyperlipidemia, Hypertension, Ventral hernia, and Ventral hernia.  PSH:    Past Surgical History:  Procedure  Laterality Date   CARPAL TUNNEL RELEASE Bilateral    CHOLECYSTECTOMY  11/05/2010   Dr. Lorrene   COLECTOMY  10/13/2007   Laparoscopic assisting ascending colectomy   COLON SURGERY  2009   remove a mass   COLONOSCOPY  03/2014   normal- cleared for 5 years- Dr Viktoria   COLONOSCOPY WITH PROPOFOL  N/A 10/12/2019   Procedure: COLONOSCOPY WITH PROPOFOL ;  Surgeon: Toledo, Ladell POUR, MD;  Location: [facility] ENDOSCOPY;  Service: Gastroenterology;  Laterality: N/A;   GALLBLADDER SURGERY     HERNIA REPAIR     JOINT REPLACEMENT     KNEE SURGERY Bilateral    menisacal repair and then replacement   LAPAROSCOPIC PARTIAL COLECTOMY     SKIN SURGERY     removal melanoma on arm and eye   TRIGGER FINGER RELEASE Left    VAGINAL HYSTERECTOMY  1984    Current Outpatient Medications  Medication Sig Dispense Refill   albuterol  (VENTOLIN  HFA) 108 (90 Base) MCG/ACT inhaler Inhale 2 puffs into the lungs every 6 (six) hours as needed for wheezing or shortness of breath. 18 g 1   allopurinol  (ZYLOPRIM ) 100 MG tablet TAKE 1 TABLET(100 MG) BY MOUTH DAILY 90 tablet 1   aspirin EC 81 MG tablet Take 81 mg by mouth daily. Swallow whole.     b complex vitamins capsule Take 1 capsule by mouth daily.     Calcium Carbonate-Vitamin D (CALTRATE 600+D PO) Take 1 tablet by mouth daily.     meclizine  (ANTIVERT ) 25 MG tablet Take 1 tablet (25 mg total) by mouth 3 (three) times daily as needed for dizziness. 30 tablet 5  meloxicam  (MOBIC ) 15 MG tablet TAKE 1 TABLET BY MOUTH DAILY 90 tablet 1   torsemide (DEMADEX) 10 MG tablet Take 10 mg by mouth every Monday, Wednesday, and Friday.     triamterene-hydrochlorothiazide (MAXZIDE-25) 37.5-25 MG tablet Take 1 tablet by mouth daily.     vitamin B-12 (CYANOCOBALAMIN) 1000 MCG tablet Take 1,000 mcg by mouth daily.     Zinc 30 MG CAPS Take 30 mg by mouth daily.     azelastine (ASTELIN) 0.1 % nasal spray Place into both nostrils 2 (two) times daily. Use in each nostril as directed (Patient not  taking: Reported on 10/12/2023)     loratadine (CLARITIN) 10 MG tablet Take 10 mg by mouth daily. otc (Patient not taking: Reported on 10/12/2023)     metoprolol  tartrate (LOPRESSOR ) 100 MG tablet Take 1 tablet 2 hours prior to CT (Patient not taking: Reported on 10/12/2023) 1 tablet 0   Current Facility-Administered Medications  Medication Dose Route Frequency Provider Last Rate Last Admin   albuterol  (PROVENTIL ) (2.5 MG/3ML) 0.083% nebulizer solution 2.5 mg  2.5 mg Nebulization Once Jones, Deanna C, MD         Allergies:   Codeine sulfate, Ezetimibe, Fenofibrate, Fenofibric acid, and Gemfibrozil   Social History:  The patient  reports that she has never smoked. She has never used smokeless tobacco. She reports that she does not drink alcohol and does not use drugs.   Family History:   family history includes Arrhythmia in her brother; Drug abuse in her maternal grandfather; Heart disease in her brother, father, and mother; Liver cancer in her mother.    Review of Systems: Review of Systems  Constitutional: Negative.   HENT: Negative.    Respiratory: Negative.    Cardiovascular: Negative.   Gastrointestinal: Negative.   Musculoskeletal: Negative.   Neurological: Negative.   Psychiatric/Behavioral: Negative.    All other systems reviewed and are negative.  PHYSICAL EXAM: VS:  BP (!) 120/56 (BP Location: Left Arm, Patient Position: Sitting, Cuff Size: Normal)   Pulse (!) 59   Ht 5' 4 (1.626 m)   Wt 175 lb 4 oz (79.5 kg)   SpO2 98%   BMI 30.08 kg/m  , BMI Body mass index is 30.08 kg/m. Constitutional:  oriented to person, place, and time. No distress.  HENT:  Head: Grossly normal Eyes:  no discharge. No scleral icterus.  Neck: No JVD, no carotid bruits  Cardiovascular: Regular rate and rhythm, no murmurs appreciated Pulmonary/Chest: Clear to auscultation bilaterally, no wheezes or rales Abdominal: Soft.  no distension.  no tenderness.  Musculoskeletal: Normal range of  motion Neurological:  normal muscle tone. Coordination normal. No atrophy Skin: Skin warm and dry Psychiatric: normal affect, pleasant    Recent Labs: No results found for requested labs within last 365 days.    Lipid Panel Lab Results  Component Value Date   CHOL 175 08/17/2017   HDL 35 (L) 08/17/2017   LDLCALC 79 08/17/2017   TRIG 306 (H) 08/17/2017      Wt Readings from Last 3 Encounters:  10/12/23 175 lb 4 oz (79.5 kg)  03/11/22 173 lb 8 oz (78.7 kg)  11/21/20 177 lb (80.3 kg)       ASSESSMENT AND PLAN:  Problem List Items Addressed This Visit       Cardiology Problems   Aortic atherosclerosis (HCC) - Primary   Relevant Medications   torsemide (DEMADEX) 10 MG tablet   Other Relevant Orders   EKG 12-Lead (Completed)  Mixed hyperlipidemia   Relevant Medications   torsemide (DEMADEX) 10 MG tablet   Essential (primary) hypertension   Relevant Medications   torsemide (DEMADEX) 10 MG tablet   Other Relevant Orders   EKG 12-Lead (Completed)     Other   Reactive airway disease   Other Visit Diagnoses       Chest pain, unspecified type           Chest tightness/angina Symptoms on last clinic visit 1 year ago, reports no significant symptoms currently Cardiac CTA with minimal coronary calcification, no stenosis  Hyperlipidemia Lovastatin previously listed, she reports she has not been on this for quite some time Reasonable cholesterol numbers not on medication We have recommended calorie restriction, walking program  Essential hypertension Blood pressure is well controlled on today's visit. No changes made to the medications.  PVCs On metoprolol  Asymptomatic    Signed, Velinda Lunger, M.D., Ph.D. [facility] [place], [place] [phone]

## 2023-10-12 ENCOUNTER — Ambulatory Visit: Attending: Cardiovascular Disease | Admitting: Cardiovascular Disease

## 2023-10-12 ENCOUNTER — Encounter: Payer: Self-pay | Admitting: Cardiovascular Disease

## 2023-10-12 VITALS — BP 120/56 | HR 59 | Ht 64.0 in | Wt 175.2 lb

## 2023-10-12 DIAGNOSIS — R079 Chest pain, unspecified: Secondary | ICD-10-CM

## 2023-10-12 DIAGNOSIS — I700 Atherosclerosis of aorta: Secondary | ICD-10-CM

## 2023-10-12 DIAGNOSIS — J4520 Mild intermittent asthma, uncomplicated: Secondary | ICD-10-CM | POA: Diagnosis not present

## 2023-10-12 DIAGNOSIS — E782 Mixed hyperlipidemia: Secondary | ICD-10-CM | POA: Diagnosis not present

## 2023-10-12 DIAGNOSIS — I10 Essential (primary) hypertension: Secondary | ICD-10-CM

## 2023-10-12 NOTE — Patient Instructions (Signed)

## 2023-10-26 ENCOUNTER — Other Ambulatory Visit: Payer: Self-pay | Admitting: Internal Medicine

## 2023-10-26 DIAGNOSIS — Z1231 Encounter for screening mammogram for malignant neoplasm of breast: Secondary | ICD-10-CM

## 2023-12-02 ENCOUNTER — Ambulatory Visit
Admission: RE | Admit: 2023-12-02 | Discharge: 2023-12-02 | Disposition: A | Discharge status: Home / Self Care | Source: Ambulatory Visit | Attending: Internal Medicine | Admitting: Internal Medicine

## 2023-12-02 DIAGNOSIS — Z1231 Encounter for screening mammogram for malignant neoplasm of breast: Secondary | ICD-10-CM | POA: Diagnosis present

## 2023-12-14 ENCOUNTER — Other Ambulatory Visit: Payer: Self-pay | Admitting: Internal Medicine

## 2023-12-14 DIAGNOSIS — R928 Other abnormal and inconclusive findings on diagnostic imaging of breast: Secondary | ICD-10-CM

## 2023-12-16 ENCOUNTER — Other Ambulatory Visit: Payer: Self-pay | Admitting: Internal Medicine

## 2023-12-16 ENCOUNTER — Ambulatory Visit
Admission: RE | Admit: 2023-12-16 | Discharge: 2023-12-16 | Disposition: A | Discharge status: Home / Self Care | Source: Ambulatory Visit | Attending: Internal Medicine | Admitting: Internal Medicine

## 2023-12-16 DIAGNOSIS — R928 Other abnormal and inconclusive findings on diagnostic imaging of breast: Secondary | ICD-10-CM | POA: Insufficient documentation

## 2023-12-16 DIAGNOSIS — N63 Unspecified lump in unspecified breast: Secondary | ICD-10-CM

## 2023-12-16 DIAGNOSIS — R921 Mammographic calcification found on diagnostic imaging of breast: Secondary | ICD-10-CM

## 2023-12-17 ENCOUNTER — Ambulatory Visit
Admission: RE | Admit: 2023-12-17 | Discharge: 2023-12-17 | Disposition: A | Discharge status: Home / Self Care | Source: Ambulatory Visit | Attending: Internal Medicine | Admitting: Internal Medicine

## 2023-12-17 DIAGNOSIS — R928 Other abnormal and inconclusive findings on diagnostic imaging of breast: Secondary | ICD-10-CM | POA: Insufficient documentation

## 2023-12-17 DIAGNOSIS — N63 Unspecified lump in unspecified breast: Secondary | ICD-10-CM | POA: Insufficient documentation

## 2023-12-17 DIAGNOSIS — C50011 Malignant neoplasm of nipple and areola, right female breast: Secondary | ICD-10-CM | POA: Insufficient documentation

## 2023-12-17 DIAGNOSIS — R921 Mammographic calcification found on diagnostic imaging of breast: Secondary | ICD-10-CM | POA: Insufficient documentation

## 2023-12-17 HISTORY — PX: BREAST BIOPSY: SHX20

## 2023-12-17 MED ORDER — LIDOCAINE-EPINEPHRINE 1 %-1:100000 IJ SOLN
8.0000 mL | Freq: Once | INTRAMUSCULAR | Status: AC
  Administered 2023-12-17: 8 mL via INTRADERMAL
  Filled 2023-12-17: qty 8

## 2023-12-17 MED ORDER — LIDOCAINE 1 % OPTIME INJ - NO CHARGE
2.0000 mL | Freq: Once | INTRAMUSCULAR | Status: AC
  Administered 2023-12-17: 2 mL via INTRADERMAL
  Filled 2023-12-17: qty 2

## 2023-12-18 LAB — SURGICAL PATHOLOGY

## 2023-12-21 ENCOUNTER — Encounter: Payer: Self-pay | Admitting: *Deleted

## 2023-12-21 NOTE — Progress Notes (Signed)
 Received referral for newly diagnosed breast cancer from [facility].  [facility] sent a message before I could call the patient that she would like to go to [facility] breast clinic.   [facility] will notify PCP of patients request and they will send the referral.
